# Patient Record
Sex: Female | Born: 1945
Health system: Southern US, Community
[De-identification: ages and names within clinical notes are randomized; demographics above are authoritative.]

## PROBLEM LIST (undated history)

## (undated) DIAGNOSIS — T783XXA Angioneurotic edema, initial encounter: Secondary | ICD-10-CM

## (undated) DIAGNOSIS — E119 Type 2 diabetes mellitus without complications: Secondary | ICD-10-CM

## (undated) DIAGNOSIS — R112 Nausea with vomiting, unspecified: Secondary | ICD-10-CM

## (undated) DIAGNOSIS — M1990 Unspecified osteoarthritis, unspecified site: Secondary | ICD-10-CM

## (undated) DIAGNOSIS — G609 Hereditary and idiopathic neuropathy, unspecified: Secondary | ICD-10-CM

## (undated) DIAGNOSIS — A048 Other specified bacterial intestinal infections: Secondary | ICD-10-CM

## (undated) DIAGNOSIS — K5792 Diverticulitis of intestine, part unspecified, without perforation or abscess without bleeding: Secondary | ICD-10-CM

## (undated) DIAGNOSIS — I10 Essential (primary) hypertension: Secondary | ICD-10-CM

## (undated) DIAGNOSIS — I499 Cardiac arrhythmia, unspecified: Secondary | ICD-10-CM

## (undated) DIAGNOSIS — C189 Malignant neoplasm of colon, unspecified: Secondary | ICD-10-CM

## (undated) DIAGNOSIS — T4145XA Adverse effect of unspecified anesthetic, initial encounter: Secondary | ICD-10-CM

## (undated) DIAGNOSIS — Z9889 Other specified postprocedural states: Secondary | ICD-10-CM

## (undated) DIAGNOSIS — T8859XA Other complications of anesthesia, initial encounter: Secondary | ICD-10-CM

## (undated) HISTORY — DX: Type 2 diabetes mellitus without complications: E11.9

## (undated) HISTORY — DX: Other specified bacterial intestinal infections: A04.8

## (undated) HISTORY — PX: TUBAL LIGATION: SHX77

## (undated) HISTORY — DX: Diverticulitis of intestine, part unspecified, without perforation or abscess without bleeding: K57.92

## (undated) HISTORY — DX: Angioneurotic edema, initial encounter: T78.3XXA

## (undated) HISTORY — PX: TONSILLECTOMY: SUR1361

## (undated) HISTORY — DX: Hereditary and idiopathic neuropathy, unspecified: G60.9

---

## 1998-03-04 ENCOUNTER — Ambulatory Visit (HOSPITAL_COMMUNITY): Admission: RE | Admit: 1998-03-04 | Discharge: 1998-03-04 | Payer: Self-pay | Admitting: Obstetrics and Gynecology

## 1998-03-04 ENCOUNTER — Encounter: Payer: Self-pay | Admitting: Obstetrics and Gynecology

## 1999-03-07 ENCOUNTER — Encounter: Payer: Self-pay | Admitting: Obstetrics and Gynecology

## 1999-03-07 ENCOUNTER — Ambulatory Visit (HOSPITAL_COMMUNITY): Admission: RE | Admit: 1999-03-07 | Discharge: 1999-03-07 | Payer: Self-pay | Admitting: Obstetrics and Gynecology

## 1999-11-10 ENCOUNTER — Ambulatory Visit (HOSPITAL_COMMUNITY): Admission: RE | Admit: 1999-11-10 | Discharge: 1999-11-10 | Payer: Self-pay | Admitting: Gastroenterology

## 1999-11-10 ENCOUNTER — Encounter (INDEPENDENT_AMBULATORY_CARE_PROVIDER_SITE_OTHER): Payer: Self-pay | Admitting: *Deleted

## 1999-12-22 ENCOUNTER — Encounter: Payer: Self-pay | Admitting: Gastroenterology

## 1999-12-22 ENCOUNTER — Encounter: Admission: RE | Admit: 1999-12-22 | Discharge: 1999-12-22 | Payer: Self-pay | Admitting: Gastroenterology

## 1999-12-28 ENCOUNTER — Encounter (INDEPENDENT_AMBULATORY_CARE_PROVIDER_SITE_OTHER): Payer: Self-pay

## 1999-12-28 ENCOUNTER — Other Ambulatory Visit: Admission: RE | Admit: 1999-12-28 | Discharge: 1999-12-28 | Payer: Self-pay | Admitting: Obstetrics and Gynecology

## 2001-01-13 ENCOUNTER — Other Ambulatory Visit: Admission: RE | Admit: 2001-01-13 | Discharge: 2001-01-13 | Payer: Self-pay | Admitting: Obstetrics and Gynecology

## 2002-05-01 ENCOUNTER — Other Ambulatory Visit: Admission: RE | Admit: 2002-05-01 | Discharge: 2002-05-01 | Payer: Self-pay | Admitting: Obstetrics and Gynecology

## 2003-10-01 ENCOUNTER — Other Ambulatory Visit: Admission: RE | Admit: 2003-10-01 | Discharge: 2003-10-01 | Payer: Self-pay | Admitting: Obstetrics and Gynecology

## 2005-01-11 ENCOUNTER — Other Ambulatory Visit: Admission: RE | Admit: 2005-01-11 | Discharge: 2005-01-11 | Payer: Self-pay | Admitting: Obstetrics and Gynecology

## 2005-04-16 HISTORY — PX: COLON RESECTION: SHX5231

## 2005-08-28 ENCOUNTER — Encounter: Admission: RE | Admit: 2005-08-28 | Discharge: 2005-08-28 | Payer: Self-pay | Admitting: Gastroenterology

## 2005-09-19 ENCOUNTER — Inpatient Hospital Stay (HOSPITAL_COMMUNITY): Admission: RE | Admit: 2005-09-19 | Discharge: 2005-09-22 | Payer: Self-pay | Admitting: Surgery

## 2005-09-19 ENCOUNTER — Encounter (INDEPENDENT_AMBULATORY_CARE_PROVIDER_SITE_OTHER): Payer: Self-pay | Admitting: Specialist

## 2005-09-21 ENCOUNTER — Ambulatory Visit: Payer: Self-pay | Admitting: Oncology

## 2005-09-24 ENCOUNTER — Ambulatory Visit: Payer: Self-pay | Admitting: Oncology

## 2005-10-18 ENCOUNTER — Ambulatory Visit (HOSPITAL_COMMUNITY): Admission: RE | Admit: 2005-10-18 | Discharge: 2005-10-18 | Payer: Self-pay | Admitting: Surgery

## 2005-10-23 LAB — CBC WITH DIFFERENTIAL/PLATELET
Basophils Absolute: 0 10*3/uL (ref 0.0–0.1)
Eosinophils Absolute: 0.2 10*3/uL (ref 0.0–0.5)
HGB: 12.2 g/dL (ref 11.6–15.9)
MCV: 89 fL (ref 81.0–101.0)
MONO#: 0.4 10*3/uL (ref 0.1–0.9)
MONO%: 6.4 % (ref 0.0–13.0)
NEUT#: 3.7 10*3/uL (ref 1.5–6.5)
Platelets: 183 10*3/uL (ref 145–400)
RBC: 4.2 10*6/uL (ref 3.70–5.32)
RDW: 14.4 % (ref 11.3–14.5)
WBC: 5.5 10*3/uL (ref 3.9–10.0)

## 2005-10-23 LAB — COMPREHENSIVE METABOLIC PANEL
Albumin: 3.6 g/dL (ref 3.5–5.2)
Alkaline Phosphatase: 86 U/L (ref 39–117)
BUN: 18 mg/dL (ref 6–23)
CO2: 26 mEq/L (ref 19–32)
Calcium: 9.4 mg/dL (ref 8.4–10.5)
Glucose, Bld: 239 mg/dL — ABNORMAL HIGH (ref 70–99)
Potassium: 3.7 mEq/L (ref 3.5–5.3)
Sodium: 135 mEq/L (ref 135–145)
Total Protein: 7.1 g/dL (ref 6.0–8.3)

## 2005-11-06 LAB — CBC WITH DIFFERENTIAL/PLATELET
BASO%: 0.6 % (ref 0.0–2.0)
EOS%: 3.3 % (ref 0.0–7.0)
HCT: 33.1 % — ABNORMAL LOW (ref 34.8–46.6)
MCH: 29.1 pg (ref 26.0–34.0)
MCHC: 33.4 g/dL (ref 32.0–36.0)
MONO%: 8 % (ref 0.0–13.0)
NEUT%: 50.6 % (ref 39.6–76.8)
RDW: 15.8 % — ABNORMAL HIGH (ref 11.3–14.5)
lymph#: 1.3 10*3/uL (ref 0.9–3.3)

## 2005-11-06 LAB — COMPREHENSIVE METABOLIC PANEL
ALT: 24 U/L (ref 0–40)
AST: 19 U/L (ref 0–37)
Alkaline Phosphatase: 61 U/L (ref 39–117)
Calcium: 9.1 mg/dL (ref 8.4–10.5)
Chloride: 102 mEq/L (ref 96–112)
Creatinine, Ser: 0.6 mg/dL (ref 0.40–1.20)

## 2005-11-09 ENCOUNTER — Ambulatory Visit: Payer: Self-pay | Admitting: Oncology

## 2005-11-20 LAB — COMPREHENSIVE METABOLIC PANEL
Albumin: 3.5 g/dL (ref 3.5–5.2)
BUN: 18 mg/dL (ref 6–23)
CO2: 27 mEq/L (ref 19–32)
Calcium: 9.6 mg/dL (ref 8.4–10.5)
Chloride: 97 mEq/L (ref 96–112)
Glucose, Bld: 266 mg/dL — ABNORMAL HIGH (ref 70–99)
Potassium: 4.2 mEq/L (ref 3.5–5.3)

## 2005-11-20 LAB — CBC WITH DIFFERENTIAL/PLATELET
Basophils Absolute: 0 10*3/uL (ref 0.0–0.1)
Eosinophils Absolute: 0.1 10*3/uL (ref 0.0–0.5)
HCT: 36 % (ref 34.8–46.6)
HGB: 12 g/dL (ref 11.6–15.9)
NEUT#: 2.6 10*3/uL (ref 1.5–6.5)
NEUT%: 56.1 % (ref 39.6–76.8)
RDW: 16.9 % — ABNORMAL HIGH (ref 11.3–14.5)
lymph#: 1.5 10*3/uL (ref 0.9–3.3)

## 2005-12-04 LAB — CBC WITH DIFFERENTIAL/PLATELET
Basophils Absolute: 0 10*3/uL (ref 0.0–0.1)
EOS%: 4.7 % (ref 0.0–7.0)
HGB: 11.5 g/dL — ABNORMAL LOW (ref 11.6–15.9)
MCH: 29.3 pg (ref 26.0–34.0)
MONO#: 0.4 10*3/uL (ref 0.1–0.9)
NEUT#: 2.4 10*3/uL (ref 1.5–6.5)
RDW: 18.2 % — ABNORMAL HIGH (ref 11.3–14.5)
WBC: 4.3 10*3/uL (ref 3.9–10.0)
lymph#: 1.3 10*3/uL (ref 0.9–3.3)

## 2005-12-04 LAB — COMPREHENSIVE METABOLIC PANEL
ALT: 26 U/L (ref 0–40)
AST: 23 U/L (ref 0–37)
Albumin: 3.5 g/dL (ref 3.5–5.2)
BUN: 17 mg/dL (ref 6–23)
Calcium: 9.3 mg/dL (ref 8.4–10.5)
Chloride: 100 mEq/L (ref 96–112)
Potassium: 4 mEq/L (ref 3.5–5.3)

## 2005-12-18 LAB — CBC WITH DIFFERENTIAL/PLATELET
Eosinophils Absolute: 0.2 10*3/uL (ref 0.0–0.5)
HCT: 30.6 % — ABNORMAL LOW (ref 34.8–46.6)
LYMPH%: 36.8 % (ref 14.0–48.0)
MCV: 87.7 fL (ref 81.0–101.0)
MONO%: 9.3 % (ref 0.0–13.0)
NEUT#: 1.6 10*3/uL (ref 1.5–6.5)
NEUT%: 47.5 % (ref 39.6–76.8)
Platelets: 129 10*3/uL — ABNORMAL LOW (ref 145–400)
RBC: 3.49 10*6/uL — ABNORMAL LOW (ref 3.70–5.32)

## 2005-12-18 LAB — COMPREHENSIVE METABOLIC PANEL
Alkaline Phosphatase: 67 U/L (ref 39–117)
BUN: 12 mg/dL (ref 6–23)
Creatinine, Ser: 0.72 mg/dL (ref 0.40–1.20)
Glucose, Bld: 275 mg/dL — ABNORMAL HIGH (ref 70–99)
Sodium: 139 mEq/L (ref 135–145)
Total Bilirubin: 0.5 mg/dL (ref 0.3–1.2)
Total Protein: 6.1 g/dL (ref 6.0–8.3)

## 2005-12-28 ENCOUNTER — Ambulatory Visit: Payer: Self-pay | Admitting: Oncology

## 2006-01-01 LAB — CBC WITH DIFFERENTIAL/PLATELET
Basophils Absolute: 0 10*3/uL (ref 0.0–0.1)
Eosinophils Absolute: 0.1 10*3/uL (ref 0.0–0.5)
HGB: 11.6 g/dL (ref 11.6–15.9)
NEUT#: 2.2 10*3/uL (ref 1.5–6.5)
RBC: 3.89 10*6/uL (ref 3.70–5.32)
RDW: 21.8 % — ABNORMAL HIGH (ref 11.3–14.5)
WBC: 4.2 10*3/uL (ref 3.9–10.0)
lymph#: 1.4 10*3/uL (ref 0.9–3.3)

## 2006-01-01 LAB — COMPREHENSIVE METABOLIC PANEL
Albumin: 3.9 g/dL (ref 3.5–5.2)
BUN: 16 mg/dL (ref 6–23)
CO2: 24 mEq/L (ref 19–32)
Calcium: 9.5 mg/dL (ref 8.4–10.5)
Chloride: 102 mEq/L (ref 96–112)
Glucose, Bld: 295 mg/dL — ABNORMAL HIGH (ref 70–99)
Potassium: 4 mEq/L (ref 3.5–5.3)
Sodium: 138 mEq/L (ref 135–145)
Total Protein: 7.1 g/dL (ref 6.0–8.3)

## 2006-01-29 LAB — COMPREHENSIVE METABOLIC PANEL
Albumin: 3.4 g/dL — ABNORMAL LOW (ref 3.5–5.2)
BUN: 14 mg/dL (ref 6–23)
CO2: 27 mEq/L (ref 19–32)
Calcium: 9.8 mg/dL (ref 8.4–10.5)
Chloride: 100 mEq/L (ref 96–112)
Creatinine, Ser: 0.69 mg/dL (ref 0.40–1.20)
Glucose, Bld: 279 mg/dL — ABNORMAL HIGH (ref 70–99)
Potassium: 4.1 mEq/L (ref 3.5–5.3)

## 2006-01-29 LAB — CBC WITH DIFFERENTIAL/PLATELET
BASO%: 0.7 % (ref 0.0–2.0)
Basophils Absolute: 0 10*3/uL (ref 0.0–0.1)
HCT: 33.7 % — ABNORMAL LOW (ref 34.8–46.6)
HGB: 11 g/dL — ABNORMAL LOW (ref 11.6–15.9)
MONO#: 0.4 10*3/uL (ref 0.1–0.9)
NEUT%: 51 % (ref 39.6–76.8)
RDW: 21.5 % — ABNORMAL HIGH (ref 11.3–14.5)
WBC: 3.4 10*3/uL — ABNORMAL LOW (ref 3.9–10.0)
lymph#: 1.1 10*3/uL (ref 0.9–3.3)

## 2006-02-12 ENCOUNTER — Ambulatory Visit: Payer: Self-pay | Admitting: Oncology

## 2006-02-12 LAB — CBC WITH DIFFERENTIAL/PLATELET
Basophils Absolute: 0 10*3/uL (ref 0.0–0.1)
EOS%: 2.3 % (ref 0.0–7.0)
Eosinophils Absolute: 0.1 10*3/uL (ref 0.0–0.5)
HCT: 36 % (ref 34.8–46.6)
HGB: 11.8 g/dL (ref 11.6–15.9)
MCH: 31.2 pg (ref 26.0–34.0)
NEUT#: 2.2 10*3/uL (ref 1.5–6.5)
NEUT%: 50.9 % (ref 39.6–76.8)
RDW: 21.3 % — ABNORMAL HIGH (ref 11.3–14.5)
lymph#: 1.4 10*3/uL (ref 0.9–3.3)

## 2006-02-12 LAB — COMPREHENSIVE METABOLIC PANEL
Albumin: 3.6 g/dL (ref 3.5–5.2)
Alkaline Phosphatase: 75 U/L (ref 39–117)
BUN: 13 mg/dL (ref 6–23)
Calcium: 9.2 mg/dL (ref 8.4–10.5)
Chloride: 99 mEq/L (ref 96–112)
Creatinine, Ser: 0.81 mg/dL (ref 0.40–1.20)
Glucose, Bld: 290 mg/dL — ABNORMAL HIGH (ref 70–99)
Potassium: 3.7 mEq/L (ref 3.5–5.3)

## 2006-02-22 LAB — CBC WITH DIFFERENTIAL/PLATELET
Basophils Absolute: 0 10*3/uL (ref 0.0–0.1)
EOS%: 4.4 % (ref 0.0–7.0)
HCT: 31.2 % — ABNORMAL LOW (ref 34.8–46.6)
HGB: 10.3 g/dL — ABNORMAL LOW (ref 11.6–15.9)
MCH: 31.3 pg (ref 26.0–34.0)
MCV: 95 fL (ref 81.0–101.0)
NEUT%: 48 % (ref 39.6–76.8)
lymph#: 1 10*3/uL (ref 0.9–3.3)

## 2006-03-12 ENCOUNTER — Ambulatory Visit: Payer: Self-pay | Admitting: Oncology

## 2006-03-12 LAB — COMPREHENSIVE METABOLIC PANEL
AST: 22 U/L (ref 0–37)
Alkaline Phosphatase: 82 U/L (ref 39–117)
BUN: 9 mg/dL (ref 6–23)
Creatinine, Ser: 0.63 mg/dL (ref 0.40–1.20)
Potassium: 3.8 mEq/L (ref 3.5–5.3)
Total Bilirubin: 0.7 mg/dL (ref 0.3–1.2)

## 2006-03-12 LAB — CBC WITH DIFFERENTIAL/PLATELET
Basophils Absolute: 0.1 10*3/uL (ref 0.0–0.1)
EOS%: 3.2 % (ref 0.0–7.0)
HGB: 11.4 g/dL — ABNORMAL LOW (ref 11.6–15.9)
LYMPH%: 21.9 % (ref 14.0–48.0)
MCH: 31.7 pg (ref 26.0–34.0)
MCV: 97.3 fL (ref 81.0–101.0)
MONO%: 8.6 % (ref 0.0–13.0)
NEUT%: 65.2 % (ref 39.6–76.8)
Platelets: 111 10*3/uL — ABNORMAL LOW (ref 145–400)
RDW: 16.8 % — ABNORMAL HIGH (ref 11.3–14.5)

## 2006-03-26 LAB — COMPREHENSIVE METABOLIC PANEL
AST: 31 U/L (ref 0–37)
Alkaline Phosphatase: 74 U/L (ref 39–117)
BUN: 17 mg/dL (ref 6–23)
Creatinine, Ser: 0.74 mg/dL (ref 0.40–1.20)

## 2006-03-26 LAB — CBC WITH DIFFERENTIAL/PLATELET
Basophils Absolute: 0 10*3/uL (ref 0.0–0.1)
EOS%: 4.1 % (ref 0.0–7.0)
HCT: 33.1 % — ABNORMAL LOW (ref 34.8–46.6)
HGB: 11 g/dL — ABNORMAL LOW (ref 11.6–15.9)
MCH: 32 pg (ref 26.0–34.0)
MCV: 96 fL (ref 81.0–101.0)
MONO%: 7.6 % (ref 0.0–13.0)
NEUT%: 59.6 % (ref 39.6–76.8)

## 2006-04-11 ENCOUNTER — Ambulatory Visit (HOSPITAL_COMMUNITY): Admission: RE | Admit: 2006-04-11 | Discharge: 2006-04-11 | Payer: Self-pay | Admitting: Oncology

## 2006-04-22 ENCOUNTER — Ambulatory Visit: Payer: Self-pay | Admitting: Oncology

## 2006-04-25 LAB — COMPREHENSIVE METABOLIC PANEL
ALT: 27 U/L (ref 0–35)
AST: 28 U/L (ref 0–37)
Alkaline Phosphatase: 78 U/L (ref 39–117)
BUN: 14 mg/dL (ref 6–23)
Creatinine, Ser: 0.64 mg/dL (ref 0.40–1.20)
Total Bilirubin: 0.9 mg/dL (ref 0.3–1.2)

## 2006-04-25 LAB — CBC WITH DIFFERENTIAL/PLATELET
BASO%: 0.3 % (ref 0.0–2.0)
Basophils Absolute: 0 10*3/uL (ref 0.0–0.1)
EOS%: 2.8 % (ref 0.0–7.0)
HCT: 37.2 % (ref 34.8–46.6)
LYMPH%: 27.1 % (ref 14.0–48.0)
MCH: 31.4 pg (ref 26.0–34.0)
MCHC: 33.2 g/dL (ref 32.0–36.0)
MCV: 94.6 fL (ref 81.0–101.0)
MONO%: 6.3 % (ref 0.0–13.0)
NEUT%: 63.5 % (ref 39.6–76.8)
Platelets: 151 10*3/uL (ref 145–400)
lymph#: 1.2 10*3/uL (ref 0.9–3.3)

## 2006-06-04 ENCOUNTER — Ambulatory Visit: Payer: Self-pay | Admitting: Oncology

## 2006-07-17 ENCOUNTER — Ambulatory Visit: Payer: Self-pay | Admitting: Oncology

## 2006-07-25 LAB — CBC WITH DIFFERENTIAL/PLATELET
BASO%: 0.2 % (ref 0.0–2.0)
Basophils Absolute: 0 10*3/uL (ref 0.0–0.1)
EOS%: 2.5 % (ref 0.0–7.0)
Eosinophils Absolute: 0.1 10*3/uL (ref 0.0–0.5)
HCT: 35.2 % (ref 34.8–46.6)
HGB: 12.2 g/dL (ref 11.6–15.9)
LYMPH%: 25.9 % (ref 14.0–48.0)
MCH: 30.9 pg (ref 26.0–34.0)
MCHC: 34.7 g/dL (ref 32.0–36.0)
MCV: 88.8 fL (ref 81.0–101.0)
MONO#: 0.3 10*3/uL (ref 0.1–0.9)
MONO%: 5.5 % (ref 0.0–13.0)
NEUT#: 3.5 10*3/uL (ref 1.5–6.5)
NEUT%: 65.9 % (ref 39.6–76.8)
Platelets: 184 10*3/uL (ref 145–400)
RBC: 3.96 10*6/uL (ref 3.70–5.32)
RDW: 15.7 % — ABNORMAL HIGH (ref 11.3–14.5)
WBC: 5.3 10*3/uL (ref 3.9–10.0)
lymph#: 1.4 10*3/uL (ref 0.9–3.3)

## 2006-07-25 LAB — COMPREHENSIVE METABOLIC PANEL
ALT: 21 U/L (ref 0–35)
AST: 14 U/L (ref 0–37)
Alkaline Phosphatase: 71 U/L (ref 39–117)
Calcium: 9.6 mg/dL (ref 8.4–10.5)
Chloride: 102 mEq/L (ref 96–112)
Creatinine, Ser: 0.65 mg/dL (ref 0.40–1.20)

## 2006-08-28 ENCOUNTER — Ambulatory Visit: Payer: Self-pay | Admitting: Oncology

## 2006-10-11 ENCOUNTER — Ambulatory Visit (HOSPITAL_COMMUNITY): Admission: RE | Admit: 2006-10-11 | Discharge: 2006-10-11 | Payer: Self-pay | Admitting: Oncology

## 2006-10-23 ENCOUNTER — Ambulatory Visit: Payer: Self-pay | Admitting: Oncology

## 2006-10-25 LAB — CBC WITH DIFFERENTIAL/PLATELET
Basophils Absolute: 0.1 10*3/uL (ref 0.0–0.1)
EOS%: 2.4 % (ref 0.0–7.0)
Eosinophils Absolute: 0.1 10*3/uL (ref 0.0–0.5)
HCT: 35.9 % (ref 34.8–46.6)
HGB: 12.3 g/dL (ref 11.6–15.9)
LYMPH%: 20.9 % (ref 14.0–48.0)
MCH: 31 pg (ref 26.0–34.0)
MCV: 90.4 fL (ref 81.0–101.0)
MONO%: 5 % (ref 0.0–13.0)
NEUT%: 70.7 % (ref 39.6–76.8)
Platelets: 157 10*3/uL (ref 145–400)
RDW: 13.2 % (ref 11.3–14.5)

## 2006-10-25 LAB — COMPREHENSIVE METABOLIC PANEL
AST: 15 U/L (ref 0–37)
Alkaline Phosphatase: 81 U/L (ref 39–117)
BUN: 20 mg/dL (ref 6–23)
Creatinine, Ser: 0.76 mg/dL (ref 0.40–1.20)
Glucose, Bld: 188 mg/dL — ABNORMAL HIGH (ref 70–99)

## 2006-10-27 ENCOUNTER — Emergency Department (HOSPITAL_COMMUNITY): Admission: EM | Admit: 2006-10-27 | Discharge: 2006-10-27 | Payer: Self-pay | Admitting: *Deleted

## 2006-12-17 ENCOUNTER — Ambulatory Visit (HOSPITAL_BASED_OUTPATIENT_CLINIC_OR_DEPARTMENT_OTHER): Admission: RE | Admit: 2006-12-17 | Discharge: 2006-12-17 | Payer: Self-pay | Admitting: Surgery

## 2007-01-28 ENCOUNTER — Ambulatory Visit: Payer: Self-pay | Admitting: Oncology

## 2007-01-30 LAB — CBC WITH DIFFERENTIAL/PLATELET
Basophils Absolute: 0 10*3/uL (ref 0.0–0.1)
Eosinophils Absolute: 0.2 10*3/uL (ref 0.0–0.5)
HGB: 12.6 g/dL (ref 11.6–15.9)
LYMPH%: 26 % (ref 14.0–48.0)
MCV: 91.5 fL (ref 81.0–101.0)
MONO#: 0.2 10*3/uL (ref 0.1–0.9)
MONO%: 3.9 % (ref 0.0–13.0)
NEUT#: 3.6 10*3/uL (ref 1.5–6.5)
Platelets: 157 10*3/uL (ref 145–400)
RDW: 14.6 % — ABNORMAL HIGH (ref 11.3–14.5)
WBC: 5.4 10*3/uL (ref 3.9–10.0)

## 2007-01-30 LAB — COMPREHENSIVE METABOLIC PANEL
ALT: 19 U/L (ref 0–35)
AST: 13 U/L (ref 0–37)
Chloride: 105 mEq/L (ref 96–112)
Creatinine, Ser: 0.57 mg/dL (ref 0.40–1.20)
Sodium: 141 mEq/L (ref 135–145)
Total Bilirubin: 0.4 mg/dL (ref 0.3–1.2)

## 2007-04-08 ENCOUNTER — Ambulatory Visit (HOSPITAL_COMMUNITY): Admission: RE | Admit: 2007-04-08 | Discharge: 2007-04-08 | Payer: Self-pay | Admitting: Oncology

## 2007-04-11 ENCOUNTER — Ambulatory Visit: Payer: Self-pay | Admitting: Oncology

## 2007-04-15 LAB — CBC WITH DIFFERENTIAL/PLATELET
Basophils Absolute: 0 10*3/uL (ref 0.0–0.1)
EOS%: 3.6 % (ref 0.0–7.0)
HCT: 36.2 % (ref 34.8–46.6)
HGB: 12.4 g/dL (ref 11.6–15.9)
LYMPH%: 27.9 % (ref 14.0–48.0)
MCH: 31.2 pg (ref 26.0–34.0)
MCV: 90.9 fL (ref 81.0–101.0)
MONO%: 5.3 % (ref 0.0–13.0)
NEUT%: 63 % (ref 39.6–76.8)
Platelets: 164 10*3/uL (ref 145–400)

## 2007-04-15 LAB — COMPREHENSIVE METABOLIC PANEL
AST: 15 U/L (ref 0–37)
Alkaline Phosphatase: 84 U/L (ref 39–117)
BUN: 17 mg/dL (ref 6–23)
Calcium: 10.1 mg/dL (ref 8.4–10.5)
Chloride: 104 mEq/L (ref 96–112)
Creatinine, Ser: 0.55 mg/dL (ref 0.40–1.20)

## 2007-08-08 ENCOUNTER — Ambulatory Visit: Payer: Self-pay | Admitting: Oncology

## 2007-08-12 LAB — COMPREHENSIVE METABOLIC PANEL
ALT: 20 U/L (ref 0–35)
AST: 14 U/L (ref 0–37)
Calcium: 9.5 mg/dL (ref 8.4–10.5)
Chloride: 101 mEq/L (ref 96–112)
Creatinine, Ser: 0.7 mg/dL (ref 0.40–1.20)
Sodium: 136 mEq/L (ref 135–145)
Total Bilirubin: 0.4 mg/dL (ref 0.3–1.2)

## 2007-08-12 LAB — CBC WITH DIFFERENTIAL/PLATELET
BASO%: 0.5 % (ref 0.0–2.0)
EOS%: 1.6 % (ref 0.0–7.0)
HCT: 33.5 % — ABNORMAL LOW (ref 34.8–46.6)
MCH: 31 pg (ref 26.0–34.0)
MCHC: 34.3 g/dL (ref 32.0–36.0)
NEUT%: 72.4 % (ref 39.6–76.8)
RBC: 3.71 10*6/uL (ref 3.70–5.32)
lymph#: 1.1 10*3/uL (ref 0.9–3.3)

## 2007-11-06 ENCOUNTER — Ambulatory Visit: Payer: Self-pay | Admitting: Oncology

## 2007-11-11 LAB — CBC WITH DIFFERENTIAL/PLATELET
BASO%: 0.3 % (ref 0.0–2.0)
Eosinophils Absolute: 0.1 10*3/uL (ref 0.0–0.5)
MCHC: 33.8 g/dL (ref 32.0–36.0)
MONO#: 0.3 10*3/uL (ref 0.1–0.9)
NEUT#: 3.9 10*3/uL (ref 1.5–6.5)
RBC: 3.99 10*6/uL (ref 3.70–5.32)
WBC: 5.9 10*3/uL (ref 3.9–10.0)
lymph#: 1.4 10*3/uL (ref 0.9–3.3)

## 2007-11-11 LAB — COMPREHENSIVE METABOLIC PANEL
ALT: 22 U/L (ref 0–35)
Albumin: 4.5 g/dL (ref 3.5–5.2)
CO2: 24 mEq/L (ref 19–32)
Calcium: 9.7 mg/dL (ref 8.4–10.5)
Chloride: 102 mEq/L (ref 96–112)
Glucose, Bld: 142 mg/dL — ABNORMAL HIGH (ref 70–99)
Potassium: 4.2 mEq/L (ref 3.5–5.3)
Sodium: 138 mEq/L (ref 135–145)
Total Bilirubin: 0.4 mg/dL (ref 0.3–1.2)
Total Protein: 7.4 g/dL (ref 6.0–8.3)

## 2007-11-11 LAB — CEA: CEA: 2.4 ng/mL (ref 0.0–5.0)

## 2007-11-14 ENCOUNTER — Ambulatory Visit (HOSPITAL_COMMUNITY): Admission: RE | Admit: 2007-11-14 | Discharge: 2007-11-14 | Payer: Self-pay | Admitting: Oncology

## 2008-01-27 ENCOUNTER — Encounter: Admission: RE | Admit: 2008-01-27 | Discharge: 2008-01-27 | Payer: Self-pay | Admitting: Family Medicine

## 2008-02-16 ENCOUNTER — Ambulatory Visit: Payer: Self-pay | Admitting: Oncology

## 2008-02-18 LAB — CBC WITH DIFFERENTIAL/PLATELET
Basophils Absolute: 0 10*3/uL (ref 0.0–0.1)
Eosinophils Absolute: 0.2 10*3/uL (ref 0.0–0.5)
HGB: 12.6 g/dL (ref 11.6–15.9)
LYMPH%: 23.5 % (ref 14.0–48.0)
MCV: 90.7 fL (ref 81.0–101.0)
MONO#: 0.2 10*3/uL (ref 0.1–0.9)
MONO%: 3.3 % (ref 0.0–13.0)
NEUT#: 3.6 10*3/uL (ref 1.5–6.5)
Platelets: 157 10*3/uL (ref 145–400)
RBC: 4.13 10*6/uL (ref 3.70–5.32)
WBC: 5.2 10*3/uL (ref 3.9–10.0)

## 2008-02-18 LAB — LACTATE DEHYDROGENASE: LDH: 98 U/L (ref 94–250)

## 2008-02-18 LAB — COMPREHENSIVE METABOLIC PANEL
Albumin: 4.3 g/dL (ref 3.5–5.2)
BUN: 19 mg/dL (ref 6–23)
CO2: 23 mEq/L (ref 19–32)
Glucose, Bld: 245 mg/dL — ABNORMAL HIGH (ref 70–99)
Potassium: 4.1 mEq/L (ref 3.5–5.3)
Sodium: 137 mEq/L (ref 135–145)
Total Bilirubin: 0.4 mg/dL (ref 0.3–1.2)
Total Protein: 7.3 g/dL (ref 6.0–8.3)

## 2008-05-13 ENCOUNTER — Ambulatory Visit: Payer: Self-pay | Admitting: Oncology

## 2008-05-17 LAB — CBC WITH DIFFERENTIAL/PLATELET
Basophils Absolute: 0 10*3/uL (ref 0.0–0.1)
EOS%: 2.3 % (ref 0.0–7.0)
Eosinophils Absolute: 0.1 10*3/uL (ref 0.0–0.5)
HCT: 38.3 % (ref 34.8–46.6)
HGB: 12.9 g/dL (ref 11.6–15.9)
LYMPH%: 23.3 % (ref 14.0–48.0)
MCH: 30.6 pg (ref 26.0–34.0)
MCV: 91 fL (ref 81.0–101.0)
MONO%: 4.7 % (ref 0.0–13.0)
NEUT#: 4.1 10*3/uL (ref 1.5–6.5)
NEUT%: 69.4 % (ref 39.6–76.8)
Platelets: 173 10*3/uL (ref 145–400)
RDW: 14.9 % — ABNORMAL HIGH (ref 11.3–14.5)

## 2008-05-18 LAB — COMPREHENSIVE METABOLIC PANEL
AST: 21 U/L (ref 0–37)
Albumin: 4.3 g/dL (ref 3.5–5.2)
Alkaline Phosphatase: 65 U/L (ref 39–117)
BUN: 20 mg/dL (ref 6–23)
Creatinine, Ser: 0.73 mg/dL (ref 0.40–1.20)
Glucose, Bld: 302 mg/dL — ABNORMAL HIGH (ref 70–99)
Total Bilirubin: 0.4 mg/dL (ref 0.3–1.2)

## 2008-06-09 ENCOUNTER — Ambulatory Visit (HOSPITAL_COMMUNITY): Admission: RE | Admit: 2008-06-09 | Discharge: 2008-06-09 | Payer: Self-pay | Admitting: Oncology

## 2008-10-21 ENCOUNTER — Ambulatory Visit (HOSPITAL_COMMUNITY): Admission: RE | Admit: 2008-10-21 | Discharge: 2008-10-21 | Payer: Self-pay | Admitting: Family Medicine

## 2008-12-14 ENCOUNTER — Ambulatory Visit: Payer: Self-pay | Admitting: Oncology

## 2008-12-16 LAB — COMPREHENSIVE METABOLIC PANEL
ALT: 18 U/L (ref 0–35)
AST: 14 U/L (ref 0–37)
Alkaline Phosphatase: 67 U/L (ref 39–117)
Calcium: 9.4 mg/dL (ref 8.4–10.5)
Chloride: 104 mEq/L (ref 96–112)
Creatinine, Ser: 0.77 mg/dL (ref 0.40–1.20)
Potassium: 4.3 mEq/L (ref 3.5–5.3)

## 2008-12-16 LAB — CBC WITH DIFFERENTIAL/PLATELET
BASO%: 0.5 % (ref 0.0–2.0)
EOS%: 3.3 % (ref 0.0–7.0)
MCH: 30.8 pg (ref 25.1–34.0)
MCHC: 33.6 g/dL (ref 31.5–36.0)
MCV: 91.8 fL (ref 79.5–101.0)
MONO%: 4.4 % (ref 0.0–14.0)
NEUT%: 64.3 % (ref 38.4–76.8)
RDW: 15.3 % — ABNORMAL HIGH (ref 11.2–14.5)
lymph#: 1.5 10*3/uL (ref 0.9–3.3)

## 2009-02-16 ENCOUNTER — Encounter: Admission: RE | Admit: 2009-02-16 | Discharge: 2009-02-16 | Payer: Self-pay | Admitting: Family Medicine

## 2009-06-14 ENCOUNTER — Ambulatory Visit (HOSPITAL_COMMUNITY): Admission: RE | Admit: 2009-06-14 | Discharge: 2009-06-14 | Payer: Self-pay | Admitting: Oncology

## 2009-06-14 ENCOUNTER — Ambulatory Visit: Payer: Self-pay | Admitting: Oncology

## 2009-06-14 LAB — CBC WITH DIFFERENTIAL/PLATELET
BASO%: 0.5 % (ref 0.0–2.0)
Basophils Absolute: 0 10*3/uL (ref 0.0–0.1)
EOS%: 4.5 % (ref 0.0–7.0)
HGB: 12.7 g/dL (ref 11.6–15.9)
MCH: 31.1 pg (ref 25.1–34.0)
MCHC: 33.8 g/dL (ref 31.5–36.0)
RBC: 4.1 10*6/uL (ref 3.70–5.45)
RDW: 15.1 % — ABNORMAL HIGH (ref 11.2–14.5)
lymph#: 1.6 10*3/uL (ref 0.9–3.3)

## 2009-06-14 LAB — COMPREHENSIVE METABOLIC PANEL
ALT: 23 U/L (ref 0–35)
AST: 21 U/L (ref 0–37)
Albumin: 4.3 g/dL (ref 3.5–5.2)
Calcium: 9.7 mg/dL (ref 8.4–10.5)
Chloride: 98 mEq/L (ref 96–112)
Potassium: 4 mEq/L (ref 3.5–5.3)
Sodium: 134 mEq/L — ABNORMAL LOW (ref 135–145)
Total Protein: 8 g/dL (ref 6.0–8.3)

## 2009-12-15 ENCOUNTER — Ambulatory Visit (HOSPITAL_BASED_OUTPATIENT_CLINIC_OR_DEPARTMENT_OTHER): Payer: Self-pay | Admitting: Oncology

## 2009-12-20 LAB — CBC WITH DIFFERENTIAL/PLATELET
BASO%: 0.3 % (ref 0.0–2.0)
Basophils Absolute: 0 10*3/uL (ref 0.0–0.1)
HCT: 35.8 % (ref 34.8–46.6)
HGB: 11.9 g/dL (ref 11.6–15.9)
LYMPH%: 22.3 % (ref 14.0–49.7)
MCH: 30.3 pg (ref 25.1–34.0)
MCHC: 33.4 g/dL (ref 31.5–36.0)
MCV: 90.7 fL (ref 79.5–101.0)
MONO%: 5.2 % (ref 0.0–14.0)
NEUT%: 67.9 % (ref 38.4–76.8)
RDW: 15.1 % — ABNORMAL HIGH (ref 11.2–14.5)
lymph#: 1.4 10*3/uL (ref 0.9–3.3)

## 2009-12-20 LAB — COMPREHENSIVE METABOLIC PANEL
ALT: 19 U/L (ref 0–35)
AST: 15 U/L (ref 0–37)
Albumin: 4 g/dL (ref 3.5–5.2)
CO2: 25 mEq/L (ref 19–32)
Calcium: 9.4 mg/dL (ref 8.4–10.5)
Chloride: 100 mEq/L (ref 96–112)
Creatinine, Ser: 0.76 mg/dL (ref 0.40–1.20)
Total Protein: 6.9 g/dL (ref 6.0–8.3)

## 2009-12-20 LAB — CEA: CEA: 2.2 ng/mL (ref 0.0–5.0)

## 2010-05-05 ENCOUNTER — Other Ambulatory Visit: Payer: Self-pay | Admitting: Oncology

## 2010-05-05 DIAGNOSIS — C189 Malignant neoplasm of colon, unspecified: Secondary | ICD-10-CM

## 2010-05-07 ENCOUNTER — Encounter: Payer: Self-pay | Admitting: Oncology

## 2010-06-19 ENCOUNTER — Other Ambulatory Visit (HOSPITAL_COMMUNITY): Payer: Self-pay

## 2010-06-19 DIAGNOSIS — C182 Malignant neoplasm of ascending colon: Secondary | ICD-10-CM

## 2010-06-26 ENCOUNTER — Other Ambulatory Visit: Payer: Self-pay | Admitting: Oncology

## 2010-06-26 ENCOUNTER — Encounter (HOSPITAL_BASED_OUTPATIENT_CLINIC_OR_DEPARTMENT_OTHER): Payer: BC Managed Care – PPO | Admitting: Oncology

## 2010-06-26 DIAGNOSIS — C182 Malignant neoplasm of ascending colon: Secondary | ICD-10-CM

## 2010-06-26 LAB — COMPREHENSIVE METABOLIC PANEL
AST: 16 U/L (ref 0–37)
Albumin: 4.3 g/dL (ref 3.5–5.2)
Alkaline Phosphatase: 72 U/L (ref 39–117)
BUN: 15 mg/dL (ref 6–23)
Potassium: 3.8 mEq/L (ref 3.5–5.3)
Sodium: 136 mEq/L (ref 135–145)
Total Bilirubin: 0.4 mg/dL (ref 0.3–1.2)

## 2010-06-26 LAB — CBC WITH DIFFERENTIAL/PLATELET
EOS%: 6.6 % (ref 0.0–7.0)
LYMPH%: 19.9 % (ref 14.0–49.7)
MCH: 30.8 pg (ref 25.1–34.0)
MCV: 91.3 fL (ref 79.5–101.0)
MONO%: 4.6 % (ref 0.0–14.0)
Platelets: 155 10*3/uL (ref 145–400)
RBC: 3.93 10*6/uL (ref 3.70–5.45)
RDW: 15.3 % — ABNORMAL HIGH (ref 11.2–14.5)

## 2010-06-27 ENCOUNTER — Encounter (HOSPITAL_COMMUNITY): Payer: Self-pay

## 2010-06-27 ENCOUNTER — Ambulatory Visit (HOSPITAL_COMMUNITY)
Admission: RE | Admit: 2010-06-27 | Discharge: 2010-06-27 | Disposition: A | Discharge status: Home / Self Care | Payer: BC Managed Care – PPO | Source: Ambulatory Visit | Attending: Oncology | Admitting: Oncology

## 2010-06-27 DIAGNOSIS — I728 Aneurysm of other specified arteries: Secondary | ICD-10-CM | POA: Insufficient documentation

## 2010-06-27 DIAGNOSIS — Z9221 Personal history of antineoplastic chemotherapy: Secondary | ICD-10-CM | POA: Insufficient documentation

## 2010-06-27 DIAGNOSIS — C189 Malignant neoplasm of colon, unspecified: Secondary | ICD-10-CM

## 2010-06-27 DIAGNOSIS — K5730 Diverticulosis of large intestine without perforation or abscess without bleeding: Secondary | ICD-10-CM | POA: Insufficient documentation

## 2010-06-27 HISTORY — DX: Malignant neoplasm of colon, unspecified: C18.9

## 2010-06-27 HISTORY — DX: Essential (primary) hypertension: I10

## 2010-06-27 MED ORDER — IOHEXOL 300 MG/ML  SOLN
100.0000 mL | Freq: Once | INTRAMUSCULAR | Status: AC | PRN
  Administered 2010-06-27: 100 mL via INTRAVENOUS

## 2010-07-07 ENCOUNTER — Encounter (HOSPITAL_BASED_OUTPATIENT_CLINIC_OR_DEPARTMENT_OTHER): Payer: BC Managed Care – PPO | Admitting: Oncology

## 2010-07-07 DIAGNOSIS — G589 Mononeuropathy, unspecified: Secondary | ICD-10-CM

## 2010-07-07 DIAGNOSIS — C182 Malignant neoplasm of ascending colon: Secondary | ICD-10-CM

## 2010-08-29 NOTE — Op Note (Signed)
NAMECANDI, PROFIT                 ACCOUNT NO.:  [id number]   MEDICAL RECORD NO.:  [id number]          PATIENT TYPE:  AMB   LOCATION:  DSC                          FACILITY:  [facility]   PHYSICIAN:  Currie Paris, M.D.DATE OF BIRTH:  1945/09/08   DATE OF PROCEDURE:  12/17/2006  DATE OF DISCHARGE:                               OPERATIVE REPORT   OFFICE MEDICAL RECORD NUMBER:  CCS [id number].   PREOPERATIVE DIAGNOSIS:  Unneeded Port-A-Cath.   POSTOPERATIVE DIAGNOSIS:  Unneeded Port-A-Cath.   OPERATION:  Removal of Port-A-Cath.   SURGEON:  Currie Paris, M.D.   ANESTHESIA:  Local.   CLINICAL HISTORY:  Ms. Goodell has completed her chemotherapy and wished  to have her Port-A-Cath removed.   DESCRIPTION OF PROCEDURE:  The patient was seen in the minor procedure  room and we confirmed Port-A-Cath removal as the planned procedure.  The  area over the Port-A-Cath was prepped with some Betadine and  anesthetized with 1% Xylocaine with epi mixed with a little bit of neut.   I waited about 10 minutes for good effect of the epinephrine.  I then  opened the old scar about 2/3 of its length.  I identified the port and  freed it up from its capsule.  The tubing was backed part way out and a  suture of 4-0 Vicryl placed around the tract to keep back bleeding from  occurring.  The tubing was removed and the suture tied down.  The  remaining attachments of the capsule to the port reservoir were divided  and the port removed.   Everything appeared to be dry.  The incision was closed in layers with 3-  0 Vicryl and 4-0 Monocryl subcuticular plus Dermabond.   The patient tolerated the procedure well and there were no  complications.      Currie Paris, M.D.  Electronically Signed     CJS/MEDQ  D:  12/17/2006  T:  12/17/2006  Job:  [id number]

## 2010-09-01 NOTE — Op Note (Signed)
Carly Buckley, Carly Buckley                 ACCOUNT NO.:  [id number]   MEDICAL RECORD NO.:  [id number]          PATIENT TYPE:  INP   LOCATION:  0002                         FACILITY[facility]   PHYSICIAN:  Currie Paris, M.D.DATE OF BIRTH:  March 24, 1946   DATE OF PROCEDURE:  09/19/2005  DATE OF DISCHARGE:                                 OPERATIVE REPORT   OFFICE MEDICAL RECORD NUMBER [id number].   PREOPERATIVE DIAGNOSIS:  Carcinoma, ascending colon.   POSTOPERATIVE DIAGNOSIS:  Carcinoma, ascending colon.   OPERATION:  Laparoscopic-assisted right hemicolectomy.   SURGEON:  Dr. Jamey Ripa.   ASSISTANT:  Dr. Johna Sheriff.   ANESTHESIA:  General endotracheal.   CLINICAL HISTORY:  Carly Buckley is a 65 year old lady who recently had a  colonoscopy, and a polyp was seen in the cecum, but an ascending colon 2-cm  cancer was also noted.  After preop evaluation, she was scheduled for right  hemicolectomy.  We planned to do this as alaparoscopically assisted.   DESCRIPTION OF PROCEDURE:  The patient was seen in the holding area, and she  had no further questions.  She confirmed that removal of the ascending colon  was the planned procedure.   The patient was then taken to operating room, and after satisfactory general  endotracheal anesthesia had been obtained, a Foley catheter was placed and  the abdomen prepped and draped.  A time-out occurred.   I made a short incision just above the umbilicus, identified the fascia,  opened it and entered the peritoneal cavity under direct vision.  A  pursestring was placed, the Hasson introduced and the abdomen was  insufflated to 15.   Under direct vision, a 10/11 trocar was placed in the lower midline and a 5-  mm in the left upper quadrant.   The camera was placed in the left lower quadrant site, and using graspers  and harmonic, I identified the appendix and the cecum and freed up a little  bit of the cecum going from the cecum superiorly.  She had a fairly  high-  riding cecum.  The terminal ileum was also tethered posteriorly.  This was  also freed up using the harmonic for several inches to get good mobility of  the terminal ileum so that we could bring it over and be sure to be able to  get out were we made our incision.  Once this was done, I continued to  mobilize the ascending colon using a combination of harmonic and blunt  dissection.  As I got up to the level of the hepatic flexure, I was unable  to well visualize coming around that area.  I therefore changed my approach  and moved to the area of the transverse colon which I well identified.  I  was able to make a small window in the omentum and entered the lesser sac.  Using harmonic again, I divided across the omentum, working from medial to  the right upper quadrant.  This gave me a nice plane, and I was able to  safely identify and preserve and not enter the stomach and duodenum.  The  remainder of the colon was then swept off, and we did see the tip of the  kidney as we were sweeping the retroperitoneal materials inferiorly and  medially.  I was then able to connect the areas where we had freed up the  peritoneum from below to above and from the second area where we started  medially and worked into the right upper quadrant.   Once I had all that done, we had a very mobile ascending colon, and I  thought we would that be ready to do the colectomy.   The supraumbilical trocar site was then enlarged, extending it inferiorly to  a length of about 7 cm.  The wound protector device was placed.  I grasped  the appendix, mobilized the terminal ileum up, then the entire right colon  and pulled the omentum out, and we had all of this well up and into the  wound.   With that done, I then used the LigaSure, divided the omentum further so  that we had the omentum freed off from a point in the transverse colon going  up to the hepatic flexure.  I could palpate the tumor within the  ascending  colon near the hepatic flexure.   Once this was done, I made a window in the colon mesentery and divided the  colon mesentery straight down through the base of the mesentery again using  the LigaSure, except for the major vessel which was double tied.  Once I had  this done, I picked spot on the very distal terminal ileum to divide the  terminal ileum and made a small window in the mesentery there.  This was  then divided down to the base of mesentery again using LigaSure for most of  this, but the major right colic/ileocolic vessels were double tied with 2-0  silks.   I then at this point had the entire colon up and all of the mesentery  divided.  We appeared to have healthy ends where we were going to do the  anastomosis.   I tacked the antimesenteric border of the colon and small bowel together.  I  opened both on the antimesenteric border in the area that was going to be  resected.  I then inserted the GIA, fired it to do a stapled side-to-side  functional end-to-end anastomosis.   Staple line was inspected and appeared dry.   I then took the TA-60 stapler, put it through in the window behind the  mesentery to come across both the small bowel and colon to close the common  defect as a single staple line, and this was done and fired.  The specimen  was cut off.  This left about a three fingerbreadth  anastomosis.  Everything appeared to be dry.  The staple lines appeared to be intact and  without any tension.   The mesenteric defect was closed with interrupted 3-0 silks.  Everything  appeared to be dry.  We irrigated and then dropped colon back in.   I opened the specimen on the back table and saw the tumor in the mid portion  of the specimen.  This was sent to pathology for confirmation.   Using new instruments and gloves, the abdomen was then closed.  I used a  running #1 PDS on the fascia and staples on the midline skin.  The abdomen was reinsufflated and the  camera placed again into the remaining lower  midline port, and there had been no  accumulation of blood or evidence of  bleeding or problems while we were closing.  The  right upper quadrant trocar was removed. There was no bleeding.  The lower  midline trocar was removed and appeared dry.  These incisions were likewise  closed with staples.   The patient tolerated the procedure well.  There were no operative  complications.  All counts were correct.      Currie Paris, M.D.  Electronically Signed     CJS/MEDQ  D:  09/19/2005  T:  09/19/2005  Job:  [id number]   cc:   Talmadge Coventry, M.D.  Fax: [phone]   Petra Kuba, M.D.  Fax: [phone]

## 2010-09-01 NOTE — Discharge Summary (Signed)
Carly Buckley, Carly Buckley                 ACCOUNT NO.:  [id number]   MEDICAL RECORD NO.:  [id number]          PATIENT TYPE:  AMB   LOCATION:  DAY                          FACILITY:  [facility]   PHYSICIAN:  Currie Paris, M.D.DATE OF BIRTH:  Feb 04, 1946   DATE OF ADMISSION:  10/18/2005  DATE OF DISCHARGE:  10/18/2005                                 DISCHARGE SUMMARY   CCS [id number].   FINAL DIAGNOSIS:  1.  Carcinoma ascending colon (T3, N1).  2.  Diabetes.  3.  Hypertension   MEDICAL HISTORY:  [name] is a 59-year lady noted to be iron deficient,  had a colonoscopy and carcinoma found in the distal ascending colon.  She  was admitted for surgery.   HOSPITAL COURSE:  The patient was admitted and taken to the operating room  where laparoscopic-assisted right hemicolectomy was performed.  The patient  tolerated the procedure well.   Postoperatively she had a benign postop course.  Her diabetes was managed  with SSI and she maintained good control.  By the second postop day we were  able to start increasing her diet.  Her path report was noted and discussed  with the patient and oncologic consultation with Dr. Blenda Nicely. Shadad  obtained.  By 06/09 she is doing well, comfortable, taking p.o.'s bowels  were working.  Abdomen was soft.  Wound was benign.  At that point she was  felt able to be discharged.  She is discharged satisfactory condition,  resume usual home medications, given a prescription for Tylox for pain and  follow-up in my office.   Final pathology report showed invasive colonic adenocarcinoma, 2 cm in size.  The nearest margin was the radial at 4.5 cm.  24 nodes were removed, one was  positive.  Other laboratory studies revealed a hemoglobin of 12, with white  count of 4000, normal electrolytes.  Hemoglobin A1c was 8.2, repeat was 7.5.  Urine was negative.      Currie Paris, M.D.  Electronically Signed     CJS/MEDQ  D:  11/05/2005  T:  11/05/2005  Job:   [id number]   cc:   Talmadge Coventry, M.D.  Fax: [phone]   Petra Kuba, M.D.  Fax: [phone]   Blenda Nicely. [employer]

## 2010-09-01 NOTE — Consult Note (Signed)
NAMEBILL, MCVEY                 ACCOUNT NO.:  [id number]   MEDICAL RECORD NO.:  [id number]          PATIENT TYPE:  INP   LOCATION:  1303                         FACILITY:  [facility]   PHYSICIAN:  Firas N. Shadad        DATE OF BIRTH:  06/10/1945   DATE OF CONSULTATION:  DATE OF DISCHARGE:                                   CONSULTATION   CONSULTING PHYSICIAN:  Currie Paris, M.D.   REASON FOR CONSULTATION:  Colon cancer.   HISTORY OF PRESENT ILLNESS:  A very pleasant 65 year old female who is a  native here of [place] with a past medical history significant for  hypertension and oral hypoglycemic control diabetes.  The patient was noted  by primary care physician at some point that she is iron deficient, that it  could not be explained, and subsequently underwent a colonoscopy under the  care of Dr. Ewing Schlein about 5 years ago and apparently has had polyps at that  time.  The patient had a recent repeat endoscopy that was done on Aug 21, 2005, which showed basically a semi-sessile polyp found in the cecum.  The  polyp was small in size.  The biopsy was taken.  There were also multiple  small and large-mouthed diverticula found throughout the colon.  There is  also an infiltrating, nonobstructive small mass found in the distal  ascending colon.  The mass measured about 2 cm in length.  No bleeding was  present and that also was biopsied.  The biopsy did confirm that this indeed  was adenocarcinoma.  The patient underwent a staging CT of the abdomen and  pelvis obtained on Aug 28, 2005.  CT scan of the abdomen showed no gross  colonic lesion seen by see scan.  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  CT  scan of the pelvis was essentially negative.  Based on these findings, the  patient was referred to Dr. Jamey Ripa for evaluation.  By the way, the  ascending mass of the polyp in the cecum showed an edematous  polyp with high-  grade dysplasia but no invasive adenocarcinoma was seen.  The patient was  set up and underwent on September 19, 2005, a laparoscopic-assisted hemicolectomy  that essentially was uncomplicated.  The pathology did reveal a T3, N1  adenocarcinoma.  The pathology report was case number [id number], which  showed an invasive colonic adenocarcinoma, tumor invades into the  pericolonic adipose tissue.  There is a separate polyp and one hyperplastic  polyp was seen.  The tumor size was 2.0 cm adenocarcinoma, grade 2.  Distance of invasive carcinoma from the nearest margin, radial margin, was  4.5 cm.  Twenty-four lymph nodes were sampled.  One was positive.  Postoperatively the patient did well without any major complications.   REVIEW OF SYSTEMS:  She did not report any headaches, blurred vision, double  vision, did not report any chest pain, shortness of breath, difficulty  breathing, no cough, hemoptysis or hematemesis.  Leading up  to surgery she  did not report any nausea, vomiting, abdominal pain, weight loss,  hematochezia, melena, change in her color or shape of her bowel movements.  Did not report any lower extremity edema.  Did not report any heat or cold  intolerance.  Did not report any bleeding diathesis.  The rest of the review  of systems was unremarkable.   PAST MEDICAL HISTORY:  Significant for hypertension and diabetes as  mentioned before.   MEDICATIONS:  Glumetza as well as glyburide, as well as  lisinopril/hydrochlorothiazide __________.   ALLERGIES:  None.   SOCIAL HISTORY:  Does not smoke or drink.  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   FAMILY HISTORY:  Positive for renal failure, lung cancer and diabetes and  hypertension.  Has one daughter that has cancer as well.   PHYSICAL EXAMINATION:  GENERAL:  Alert, awake female, not in any distress.  VITAL SIGNS:  Blood pressure is 151/81, pulse 93,  respirations 18,  temperature 98.  She is saturating 98% on room air.  HEENT:  Head is normocephalic, atraumatic.  Pupils equal, round, and  reactive to light.  Mucous membranes moist and pink.  NECK:  Supple, no lymphadenopathy.  CARDIAC:  Heart is regular rate and rhythm, S1 and S2.  LUNGS:  Clear to auscultation, no rhonchi or wheeze or dullness to  percussion.  ABDOMEN:  Soft, nontender, no hepatosplenomegaly.  EXTREMITIES:  No clubbing, cyanosis, or edema.  NEUROLOGIC:  Intact motor, sensory and deep tendon reflexes.  SKIN:  Her abdominal incision appeared well-healed and nontender to touch.  No evidence of drainage.   LABORATORY DATA:  Hemoglobin of 12.2, white cells of 5.0, platelet count of  196.  Potassium of 3.8, creatinine of 0.8 and bilirubin of 0.4.  All liver  function tests otherwise normal.  Hemoglobin A1c was slightly elevated at  8.2.   ASSESSMENT AND PLAN:  This is a very pleasant 65 year old female with a  history of hypertension and diabetes, now with a new diagnosis of T3, N1  colon cancer of the ascending colon.  The patient had presented with  asymptomatic iron deficiency and had developed a mass in the ascending  colon.  The patient had an excellent surgery and an excellent lymph node  dissection with lymph sampling more than 12.  However, she did have one  positive lymph node, which put her at risk of systemic recurrence.  We had a  long discussion today discussing the risks and benefits of chemotherapy, and  I think she certainly would benefit from adjuvant chemotherapy.  I think she  has an excellent performance status that she could tolerate combination  chemotherapy using 5-FU, leucovorin and oxaliplatin-based regimen.  Currently I would like for her to recover more from surgery.  I will set her  up with a  follow-up in the next 2-3 weeks, probably plan chemotherapy in about 4-6 weeks after surgery.  Will evaluate her in the [facility] cancer center.  We   will set her up with an appointment before she leaves the hospital at this  point.   Thank you for allowing me to participate in her care.           ______________________________  Blenda Nicely. [facility]  Electronically Signed     FNS/MEDQ  D:  09/21/2005  T:  09/22/2005  Job:  [id number]   cc:   Currie Paris, M.D.  [address]  [place]  [place] [address]   Petra Kuba, M.D.  Fax: [phone]   Talmadge Coventry, M.D.  Fax: [phone]

## 2010-09-01 NOTE — Op Note (Signed)
[place]  Patient:    Carly Buckley, Carly Buckley                        MRN: [id number] Proc. Date: 11/10/99 Adm. Date:  [id number] Disc. Date: [id number] Attending:  Nelda Marseille                           Operative Report  PROCEDURE:  Colonoscopy.  INDICATIONS:  Patient with iron deficiency anemia, due for colonic screening. Consent was signed after risks, benefits, methods and options were earlier discussed in the office.  MEDICINES USED:  Demerol 75, Versed 7.  PROCEDURE IN DETAIL:  Rectal inspection was pertinent for external hemorrhoids.  Digital exam was negative.  Our medical resident was able to insert the scope to about 35 cm.  At that point there was looping.  I took over the controls and was easily able to advance to the cecum.  This did require some abdominal pressure but no position changes.  The cecum was identified by the appendiceal orifice and the ileocecal valve.  On insertion some left-sided diverticula were seen.  Entering the hepatic flexure, a 4 mm polyp was seen, was hot biopsied times one on insertion.  The scope was inserted short ways in the terminal ileum, which was normal.  Further documentation was obtained.  The scope was slowly withdrawn.  The prep was adequate.  There was some liquid stool that required washing and suctioning. The cecum and the ascending were normal.  We withdrew back to the polyp seen on insertion and additional hot biopsies were obtained.  Across the fold from this, another small 2 mm polyp was seen and was hot biopsied times one.  On slow withdrawal through the colon in the mid transverse, another 2 mm polyp was seen and was hot biopsied as well, and they were all put in the first container.  The scope was slowly withdrawn around the left side of the colon. In the mid descending another medium size polyp was seen about 6 mm and was hot biopsied times three and put in a second container.  This one  possibly was slightly lipomatous.  The scope was further withdrawn.  No other abnormalities were seen but rare left-sided diverticula as we slowly withdrew back to the rectum.  Once back in the rectum, the scope was retroflexed, pertinent for some internal hemorrhoids.  The scope was readvanced a short ways up the sigmoid.  Air was suctioned, scope removed.  The patient tolerated the procedure well.  There was no obvious immediate complication.  ENDOSCOPIC DIAGNOSES: 1.  Internal and external hemorrhoids. 2.  Left-sided diverticula. 3.  Four polyps hot biopsied in the descending, transverse and hepatic flexure. 4.  Otherwise within normal limits to the terminal ileum.  PLAN:  Customary one-week post polypectomy instructions.  Await pathology to determine future screening.  Okay to restart iron, and I will see her back p.r.n. or in six weeks to recheck symptoms, guaiac, CBC and decide any other workup and plans. DD:  11/10/99 TD:  11/12/99 Job: [id number]

## 2010-09-01 NOTE — Op Note (Signed)
Carly Buckley, Carly Buckley                 ACCOUNT NO.:  [id number]   MEDICAL RECORD NO.:  [id number]          PATIENT TYPE:  AMB   LOCATION:  DAY                          FACILITY:  [facility]   PHYSICIAN:  Currie Paris, M.D.DATE OF BIRTH:  08-15-1945   DATE OF PROCEDURE:  10/18/2005  DATE OF DISCHARGE:                                 OPERATIVE REPORT   PREOPERATIVE DIAGNOSIS:  Carcinoma of colon, inadequate venous access for  chemo.   POSTOPERATIVE DIAGNOSIS:  Carcinoma of colon, inadequate venous access for  chemo.   OPERATION:  Placement of Port-A-Cath.   SURGEON:  Currie Paris, M.D.   ANESTHESIA:  MAC.   CLINICAL HISTORY:  Ms. Neault is getting ready to have chemo for her colon  cancer.  She has very poor peripheral IV access, so Port-A-Cath was  requested.   DESCRIPTION OF PROCEDURE:  The patient seen in the holding area.  I reviewed  the indications, risks and complications with the patient and she had missed  her preoperative office appointment.  She had no further questions.  She  confirmed Port-A-Cath placement as the planned procedure.   The patient then taken to operating room and given IV sedation.  The upper  chest, lower neck areas were prepped and draped as a single sterile field.  The time-out occurred.   The patient placed in Trendelenburg.  Local anesthesia using 1% Xylocaine  was infiltrated in the left infraclavicular area and the left subclavian  vein entered on initial attempt.  The guidewire threaded easily and was  positioned in the superior vena cavae using fluoro control.  Additional  local infiltrated on the anterior chest wall.  A transverse incision was  made and a subcu pocket fashioned with cautery.   A tunnel was made using the tunneling device and the Port-A-Cath tubing  pulled through.  It was flushed.   The guidewire tract was dilated once with a #8 dilator peel-away sheath.  The dilator and guidewire removed and the catheter threaded  to approximately  22 cm.  The peel-away sheath was removed.   Using fluoro we saw that we were in the right atrium and this was backed up  into about the junction of the superior vena cava atrium.  It aspirated  irrigated easily.   The reservoir was flushed, attached and locking mechanism engaged.  It  aspirated irrigated easily.   And the reservoir was placed in a pocket and sutured down with 4-0 Vicryl.  Final fluoro check was made and everything appeared to be in good position.   The catheter was flushed again with dilute heparin followed by concentrated  aqueous heparin.  The incision was closed with 3-0 Vicryl, 4-0 Monocryl  subcuticular and Dermabond.   The patient tolerated procedure well.  There were no operative  complications.  All counts were correct.      Currie Paris, M.D.  Electronically Signed     CJS/MEDQ  D:  10/18/2005  T:  10/18/2005  Job:  [id number]   cc:   Talmadge Coventry, M.D.  Fax: [phone]

## 2011-01-10 ENCOUNTER — Other Ambulatory Visit: Payer: Self-pay | Admitting: Oncology

## 2011-01-10 ENCOUNTER — Encounter (HOSPITAL_BASED_OUTPATIENT_CLINIC_OR_DEPARTMENT_OTHER): Payer: Medicare Other | Admitting: Oncology

## 2011-01-10 DIAGNOSIS — C182 Malignant neoplasm of ascending colon: Secondary | ICD-10-CM

## 2011-01-10 DIAGNOSIS — G589 Mononeuropathy, unspecified: Secondary | ICD-10-CM

## 2011-01-10 DIAGNOSIS — C189 Malignant neoplasm of colon, unspecified: Secondary | ICD-10-CM

## 2011-01-10 LAB — CBC WITH DIFFERENTIAL/PLATELET
Basophils Absolute: 0 10*3/uL (ref 0.0–0.1)
EOS%: 5.5 % (ref 0.0–7.0)
HGB: 12.6 g/dL (ref 11.6–15.9)
MCH: 30.9 pg (ref 25.1–34.0)
MCV: 90.9 fL (ref 79.5–101.0)
MONO%: 4.7 % (ref 0.0–14.0)
NEUT#: 3.7 10*3/uL (ref 1.5–6.5)
RBC: 4.07 10*6/uL (ref 3.70–5.45)
RDW: 15.3 % — ABNORMAL HIGH (ref 11.2–14.5)
lymph#: 1.5 10*3/uL (ref 0.9–3.3)

## 2011-01-10 LAB — COMPREHENSIVE METABOLIC PANEL
ALT: 21 U/L (ref 0–35)
AST: 14 U/L (ref 0–37)
Albumin: 4.6 g/dL (ref 3.5–5.2)
Alkaline Phosphatase: 73 U/L (ref 39–117)
Calcium: 9.7 mg/dL (ref 8.4–10.5)
Chloride: 102 mEq/L (ref 96–112)
Potassium: 3.9 mEq/L (ref 3.5–5.3)
Sodium: 140 mEq/L (ref 135–145)
Total Protein: 7 g/dL (ref 6.0–8.3)

## 2011-03-21 ENCOUNTER — Encounter: Payer: Self-pay | Admitting: Family Medicine

## 2011-03-21 ENCOUNTER — Ambulatory Visit (INDEPENDENT_AMBULATORY_CARE_PROVIDER_SITE_OTHER): Payer: Medicare Other | Admitting: Family Medicine

## 2011-03-21 DIAGNOSIS — B86 Scabies: Secondary | ICD-10-CM | POA: Insufficient documentation

## 2011-03-21 DIAGNOSIS — E119 Type 2 diabetes mellitus without complications: Secondary | ICD-10-CM

## 2011-03-21 DIAGNOSIS — I10 Essential (primary) hypertension: Secondary | ICD-10-CM | POA: Insufficient documentation

## 2011-03-21 MED ORDER — METHYLPREDNISOLONE ACETATE 80 MG/ML IJ SUSP
80.0000 mg | Freq: Once | INTRAMUSCULAR | Status: AC
  Administered 2011-03-21: 80 mg via INTRAMUSCULAR

## 2011-03-21 MED ORDER — PREDNISONE 20 MG PO TABS: ORAL_TABLET | ORAL | Status: DC

## 2011-03-21 MED ORDER — GLUCOSE BLOOD VI STRP: ORAL_STRIP | Status: DC

## 2011-03-21 MED ORDER — PERMETHRIN 5 % EX CREA: TOPICAL_CREAM | Freq: Once | CUTANEOUS | Status: DC

## 2011-03-21 NOTE — Progress Notes (Signed)
  Subjective:    Patient ID: Carly Buckley, female    DOB: 09/23/1945, 65 y.o.   MRN: [id number]  HPI New to establish.  Previous MD- Cherylann Ratel then Mertha Finders.  GI- Magod, Onc- Shadad  HTN- chronic problem, on Norvasc and Lisinopril HCT.  Well controlled.  No CP, SOB, HAs, visual changes, edema.  DM- chronic problem, dx'd ~30 yrs ago.  Takes Amaryl 2 tabs daily.  Metformin 1000 QAM, 500 qnoon, 1000 QHS.  Last A1C was November.  Itching- has discussed this w/ Dr Duanne Guess.  sxs started 1 month ago.  [place] the woods.  sxs primarily on arms and chest.  Last night had sxs between her fingers.   Review of Systems For ROS see HPI     Objective:   Physical Exam  Vitals reviewed. Constitutional: She is oriented to person, place, and time. She appears well-developed and well-nourished. No distress.  HENT:  Head: Normocephalic and atraumatic.  Eyes: Conjunctivae and EOM are normal. Pupils are equal, round, and reactive to light.  Neck: Normal range of motion. Neck supple. No thyromegaly present.  Cardiovascular: Normal rate, regular rhythm, normal heart sounds and intact distal pulses.   Pulmonary/Chest: Effort normal and breath sounds normal. No respiratory distress.  Abdominal: Soft. She exhibits no distension. There is no tenderness.  Musculoskeletal: She exhibits no edema.  Lymphadenopathy:    She has no cervical adenopathy.  Neurological: She is alert and oriented to person, place, and time.  Skin: Skin is warm and dry. Rash (areas on forearms and interdigit webbing consistent w/ scabies) noted.  Psychiatric: She has a normal mood and affect. Her behavior is normal.          Assessment & Plan:

## 2011-03-21 NOTE — Patient Instructions (Signed)
Schedule your complete physical for February- do not eat before this appt Use the Elimite cream to treat the scabies- apply from your jaw down and sleep in it overnight before washing it off.  If you still have symptoms after 1 week- use the 2nd half of the tube Start the Prednisone tabs tomorrow morning- take both at the same time (w/ food) Call with any questions or concerns Welcome!  We're glad to have you!

## 2011-03-22 ENCOUNTER — Ambulatory Visit: Payer: Medicare Other | Admitting: Family Medicine

## 2011-03-23 ENCOUNTER — Other Ambulatory Visit: Payer: Self-pay | Admitting: Family Medicine

## 2011-03-23 NOTE — Telephone Encounter (Signed)
rx sent to pharmacy by e-script  

## 2011-04-01 NOTE — Assessment & Plan Note (Signed)
New.  Pt's rash and itching is consistent w/ scabies.  Start elimite and steroids for itching.  Reviewed supportive care and red flags that should prompt return.  Pt expressed understanding and is in agreement w/ plan.

## 2011-04-01 NOTE — Assessment & Plan Note (Signed)
Uncertain as to level of control b/c pt is overdue for labs and has not been following regularly.  Check labs.  Adjust meds prn.  Stressed importance of ADA diet and regular activity.  Will follow closely.

## 2011-04-01 NOTE — Assessment & Plan Note (Signed)
Chronic problem.  Well controlled on current meds.  Asymptomatic.  No changes at this time. 

## 2011-04-30 ENCOUNTER — Encounter: Payer: Self-pay | Admitting: Family Medicine

## 2011-04-30 ENCOUNTER — Ambulatory Visit (INDEPENDENT_AMBULATORY_CARE_PROVIDER_SITE_OTHER): Payer: Medicare Other | Admitting: Family Medicine

## 2011-04-30 DIAGNOSIS — R6889 Other general symptoms and signs: Secondary | ICD-10-CM

## 2011-04-30 DIAGNOSIS — B86 Scabies: Secondary | ICD-10-CM

## 2011-04-30 DIAGNOSIS — R196 Halitosis: Secondary | ICD-10-CM | POA: Insufficient documentation

## 2011-04-30 MED ORDER — ESTROGENS, CONJUGATED 0.625 MG/GM VA CREA: TOPICAL_CREAM | Freq: Every day | VAGINAL | Status: DC

## 2011-04-30 MED ORDER — LISINOPRIL-HYDROCHLOROTHIAZIDE 20-25 MG PO TABS: 1.0000 | ORAL_TABLET | Freq: Every day | ORAL | Status: DC

## 2011-04-30 MED ORDER — METFORMIN HCL 1000 MG PO TABS: 1000.0000 mg | ORAL_TABLET | Freq: Two times a day (BID) | ORAL | Status: DC

## 2011-04-30 MED ORDER — GLIMEPIRIDE 4 MG PO TABS: 4.0000 mg | ORAL_TABLET | Freq: Every day | ORAL | Status: DC

## 2011-04-30 MED ORDER — METFORMIN HCL 500 MG PO TABS: 500.0000 mg | ORAL_TABLET | Freq: Two times a day (BID) | ORAL | Status: DC

## 2011-04-30 NOTE — Patient Instructions (Signed)
We will call you with your derm appt for the continued itching Use benadryl as needed We'll notify you of your lab results If the labs are negative, we'll refer you back to Dr Ewing Schlein Call with any questions or concerns Hang in there!

## 2011-04-30 NOTE — Progress Notes (Signed)
  Subjective:    Patient ID: Carly Buckley, female    DOB: Dec 24, 1945, 65 y.o.   MRN: [id number]  HPI Scabies- itching has improved but still present.  Itching in axilla, back.  Groin itching has improved.  Completed tx w/ elimite.  No rash on skin.  Husband is not itching.  Has outdoor cat that she will pet.  Mouth odor- reports she has had this x1 yr.  Saw previous MD about this.  Feels sxs are coming from gut.  Had urease breath test- isn't aware of results.  Started on 14 days of Prevacid- no relief.  No abd pain.  Rare heartburn.  sxs are intermittent.  Has seen ENT and dentist- they cleared her.   Review of Systems For ROS see HPI     Objective:   Physical Exam  Vitals reviewed. Constitutional: She appears well-developed and well-nourished. No distress.  HENT:  Nose: Nose normal.  Mouth/Throat: Oropharynx is clear and moist. No oropharyngeal exudate.  Abdominal: Soft. Bowel sounds are normal. She exhibits no distension. There is no tenderness. There is no rebound and no guarding.  Skin: Skin is warm and dry. No rash noted. No erythema.          Assessment & Plan:

## 2011-05-01 ENCOUNTER — Telehealth: Payer: Self-pay | Admitting: *Deleted

## 2011-05-01 NOTE — Telephone Encounter (Signed)
Called pt to give lab results and to advise she needs to schedule an upcoming appt for DM per has not seen in office, pt advised that she made an upcoming ov on 05-24-11 at 8:30am, advised pt that she will need to be fasting, pt understood her lab results and transferred to referral dept per pt missed previous call about her dermotology appt

## 2011-05-13 NOTE — Assessment & Plan Note (Signed)
Pt has completed 2 rounds of tx w/ elimite w/out resolution of sxs.  Household contacts do not have similar sxs.  Due to persistent itching will refer to derm for evaluation and tx.  Pt expressed understanding and is in agreement w/ plan.

## 2011-05-13 NOTE — Assessment & Plan Note (Signed)
New.  Pt has been cleared by both ENT and dentist regarding oral issues.  She feels sxs are coming from gut but no relief w/ GERD meds.  Will check H pylori to r/o as possible cause.  If labs (-), she will need to call GI to set up appt.  Pt expressed understanding and is in agreement w/ plan.

## 2011-05-19 ENCOUNTER — Telehealth: Payer: Self-pay | Admitting: Oncology

## 2011-05-19 NOTE — Telephone Encounter (Signed)
Called pt, left message , lab abd CT on 07/11/11, NPO 4 hrs prior to CT. Pt will see md on 07/13/11

## 2011-05-24 ENCOUNTER — Ambulatory Visit (INDEPENDENT_AMBULATORY_CARE_PROVIDER_SITE_OTHER): Payer: Medicare Other | Admitting: Family Medicine

## 2011-05-24 ENCOUNTER — Encounter: Payer: Self-pay | Admitting: Family Medicine

## 2011-05-24 DIAGNOSIS — E119 Type 2 diabetes mellitus without complications: Secondary | ICD-10-CM

## 2011-05-24 DIAGNOSIS — I10 Essential (primary) hypertension: Secondary | ICD-10-CM

## 2011-05-24 DIAGNOSIS — R6889 Other general symptoms and signs: Secondary | ICD-10-CM

## 2011-05-24 DIAGNOSIS — Z780 Asymptomatic menopausal state: Secondary | ICD-10-CM

## 2011-05-24 DIAGNOSIS — Z1231 Encounter for screening mammogram for malignant neoplasm of breast: Secondary | ICD-10-CM

## 2011-05-24 DIAGNOSIS — Z0000 Encounter for general adult medical examination without abnormal findings: Secondary | ICD-10-CM | POA: Insufficient documentation

## 2011-05-24 DIAGNOSIS — R196 Halitosis: Secondary | ICD-10-CM

## 2011-05-24 NOTE — Patient Instructions (Addendum)
Please schedule your diabetes follow up in 3 months We'll notify you of your lab results and make any changes if needed Someone will call you with your bone density and mammogram appt Someone will call you with your GI appt Start the Nexium daily and see if your symptoms improve Call with any questions or concerns Happy Valentine's Day!

## 2011-05-24 NOTE — Progress Notes (Signed)
  Subjective:    Patient ID: Carly Buckley, female    DOB: Aug 21, 1945, 66 y.o.   MRN: [id number]  HPI Here today for CPE.  Risk Factors: DM- chronic problem, on Metformin and Amaryl.  Rarely checking CBGs.  Denies symptomatic lows.  UTD on eye exam.  No N/V/D.  abd pain.  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 HTN- chronic problem, on lisinopril HCTZ and amlodipine.  Denies CP, SOB, HAs, visual changes, edema Physical Activity: little activity due to 'bad knees' Fall Risk: low risk, very steady on feet Depression: no concerns Hearing: decreased to whispered voice ADL's: independent Cognitive: normal linear thought process, no deficits in attention or memory Home Safety: feels safe at home Height, Weight, BMI, Visual Acuity: see vitals, vision corrected to 20/20 w/ glasses Counseling: UTD on pap (last year), last mammo and DEXA 2010.  Uncertain as to date of Pneumovax, UTD on colonoscopy (Dr Ewing Schlein) Labs Ordered: See A&P Care Plan: See A&P    Review of Systems Patient reports no vision/ hearing changes, adenopathy,fever, weight change,  persistant/recurrent hoarseness , swallowing issues, chest pain, palpitations, edema, persistant/recurrent cough, hemoptysis, dyspnea (rest/exertional/paroxysmal nocturnal), gastrointestinal bleeding (melena, rectal bleeding), abdominal pain, significant heartburn, bowel changes, GU symptoms (dysuria, hematuria, incontinence), Gyn symptoms (abnormal  bleeding, pain),  syncope, focal weakness, memory loss, skin/hair/nail changes, abnormal bruising or bleeding, anxiety, or depression.     Objective:   Physical Exam  General Appearance:    Alert, cooperative, no distress, appears stated age  Head:    Normocephalic, without obvious abnormality, atraumatic  Eyes:    PERRL, conjunctiva/corneas clear, EOM's intact, fundi    benign, both eyes  Ears:    Normal TM's and external ear canals, both ears  Nose:   Nares normal, septum midline, mucosa  normal, no drainage    or sinus tenderness  Throat:   Lips, mucosa, and tongue normal; teeth and gums normal  Neck:   Supple, symmetrical, trachea midline, no adenopathy;    Thyroid: no enlargement/tenderness/nodules  Back:     Symmetric, no curvature, ROM normal, no CVA tenderness  Lungs:     Clear to auscultation bilaterally, respirations unlabored  Chest Wall:    No tenderness or deformity   Heart:    Regular rate and rhythm, S1 and S2 normal, no murmur, rub   or gallop  Breast Exam:    No tenderness, masses, or nipple abnormality  Abdomen:     Soft, non-tender, bowel sounds active all four quadrants,    no masses, no organomegaly  Genitalia:    Deferred at pt's request  Rectal:    Extremities:   Extremities normal, atraumatic, no cyanosis or edema  Pulses:   2+ and symmetric all extremities  Skin:   Skin color, texture, turgor normal, no rashes or lesions  Lymph nodes:   Cervical, supraclavicular, and axillary nodes normal  Neurologic:   CNII-XII intact, normal strength, sensation and reflexes    throughout          Assessment & Plan:

## 2011-05-25 ENCOUNTER — Other Ambulatory Visit: Payer: Self-pay | Admitting: Family Medicine

## 2011-05-28 ENCOUNTER — Other Ambulatory Visit (INDEPENDENT_AMBULATORY_CARE_PROVIDER_SITE_OTHER): Payer: Medicare Other

## 2011-05-28 DIAGNOSIS — E119 Type 2 diabetes mellitus without complications: Secondary | ICD-10-CM

## 2011-05-28 DIAGNOSIS — Z0000 Encounter for general adult medical examination without abnormal findings: Secondary | ICD-10-CM

## 2011-05-28 LAB — HEPATIC FUNCTION PANEL
ALT: 16 U/L (ref 0–35)
Bilirubin, Direct: 0 mg/dL (ref 0.0–0.3)
Total Bilirubin: 0.5 mg/dL (ref 0.3–1.2)

## 2011-05-28 LAB — BASIC METABOLIC PANEL
BUN: 17 mg/dL (ref 6–23)
Calcium: 9.5 mg/dL (ref 8.4–10.5)
Chloride: 99 mEq/L (ref 96–112)
Creatinine, Ser: 0.6 mg/dL (ref 0.4–1.2)
GFR: 115.3 mL/min (ref >60.00)

## 2011-05-28 LAB — CBC WITH DIFFERENTIAL/PLATELET
Basophils Absolute: 0 10*3/uL (ref 0.0–0.1)
Eosinophils Absolute: 0.5 10*3/uL (ref 0.0–0.7)
Hemoglobin: 12.1 g/dL (ref 12.0–15.0)
Lymphocytes Relative: 23.4 % (ref 12.0–46.0)
MCHC: 33.3 g/dL (ref 30.0–36.0)
MCV: 94.9 fl (ref 78.0–100.0)
Monocytes Absolute: 0.3 10*3/uL (ref 0.1–1.0)
Neutro Abs: 3.7 10*3/uL (ref 1.4–7.7)
RDW: 15.4 % — ABNORMAL HIGH (ref 11.5–14.6)

## 2011-05-28 LAB — LIPID PANEL
Cholesterol: 180 mg/dL (ref 0–200)
HDL: 68.1 mg/dL (ref >39.00)
LDL Cholesterol: 103 mg/dL — ABNORMAL HIGH (ref 0–99)
Total CHOL/HDL Ratio: 3
Triglycerides: 46 mg/dL (ref 0.0–149.0)

## 2011-06-11 ENCOUNTER — Other Ambulatory Visit: Payer: Self-pay | Admitting: *Deleted

## 2011-06-11 MED ORDER — GLIMEPIRIDE 4 MG PO TABS: 4.0000 mg | ORAL_TABLET | Freq: Every day | ORAL | Status: DC

## 2011-06-11 NOTE — Telephone Encounter (Signed)
1 tab of Amaryl 4mg 

## 2011-06-11 NOTE — Telephone Encounter (Signed)
Please clarify if Pt is to take 2 tab of amaryl 4mg  or 1 tab.

## 2011-06-11 NOTE — Telephone Encounter (Signed)
Rx sent, Discuss with Kathrynn Ducking.

## 2011-06-12 NOTE — Assessment & Plan Note (Addendum)
New.  Pt's PE WNL.  Overdue on mammo and DEXA- referrals made.  Anticipatory guidance provided.

## 2011-06-12 NOTE — Assessment & Plan Note (Signed)
Refer to GI for complete evaluation.  Start PPI in the interim.  Pt expressed understanding and is in agreement w/ plan.

## 2011-06-12 NOTE — Assessment & Plan Note (Signed)
Chronic problem.  Not checking CBGs.  UTD on eye exam.  Asymptomatic.  Check labs.  Adjust meds prn.

## 2011-06-12 NOTE — Assessment & Plan Note (Signed)
Chronic problem.  Adequate control.  Asymptomatic.  No changes. 

## 2011-06-19 ENCOUNTER — Ambulatory Visit
Admission: RE | Admit: 2011-06-19 | Discharge: 2011-06-19 | Disposition: A | Discharge status: Home / Self Care | Payer: Medicare Other | Source: Ambulatory Visit | Attending: Family Medicine | Admitting: Family Medicine

## 2011-06-19 ENCOUNTER — Telehealth: Payer: Self-pay

## 2011-06-19 DIAGNOSIS — Z780 Asymptomatic menopausal state: Secondary | ICD-10-CM

## 2011-06-19 DIAGNOSIS — Z1231 Encounter for screening mammogram for malignant neoplasm of breast: Secondary | ICD-10-CM

## 2011-06-19 NOTE — Telephone Encounter (Signed)
Patient said she was returning your call. 

## 2011-06-20 NOTE — Telephone Encounter (Signed)
Spoke to pt to advise results/instructions. Pt understood.  

## 2011-06-25 ENCOUNTER — Ambulatory Visit (INDEPENDENT_AMBULATORY_CARE_PROVIDER_SITE_OTHER)
Admission: RE | Admit: 2011-06-25 | Discharge: 2011-06-25 | Disposition: A | Discharge status: Home / Self Care | Payer: Medicare Other | Source: Ambulatory Visit | Attending: Family Medicine | Admitting: Family Medicine

## 2011-06-25 DIAGNOSIS — Z0000 Encounter for general adult medical examination without abnormal findings: Secondary | ICD-10-CM

## 2011-06-29 ENCOUNTER — Telehealth: Payer: Self-pay | Admitting: *Deleted

## 2011-06-29 NOTE — Telephone Encounter (Signed)
Called pt to advise that we have received her bone scan results back to advise it was normal, left vm to call office per noted in faxed form viewed by MD Beverely Low,

## 2011-07-02 ENCOUNTER — Telehealth: Payer: Self-pay | Admitting: Family Medicine

## 2011-07-02 MED ORDER — LISINOPRIL-HYDROCHLOROTHIAZIDE 20-25 MG PO TABS: 1.0000 | ORAL_TABLET | Freq: Every day | ORAL | Status: DC

## 2011-07-02 MED ORDER — METFORMIN HCL 1000 MG PO TABS: 1000.0000 mg | ORAL_TABLET | Freq: Two times a day (BID) | ORAL | Status: DC

## 2011-07-02 NOTE — Telephone Encounter (Signed)
Refill- lisino-hctz 20-25mg  tab. Take one tablet by mouth every day. Qty 30 last fill 2.19.13  Refill- metformin  1000mg  tab. Take one tablet by mouth twice daily with meals. Qty 60 last fill 2.19.13

## 2011-07-03 NOTE — Progress Notes (Signed)
Patient's husband called to cancel appointments scheduled for patient on 3/27 and 3/29; states that patient is sick and cannot come next week; note to schedulers to contact patient to reschedule appointments.

## 2011-07-04 ENCOUNTER — Telehealth: Payer: Self-pay | Admitting: Oncology

## 2011-07-04 NOTE — Telephone Encounter (Signed)
pts husband rtn call and scheduled pt for ct scan on 04/08 @ [facility] and f/u on 04/11.

## 2011-07-04 NOTE — Telephone Encounter (Signed)
pt called nurse and asked that we cancelled appt for 03/27-03/29.  rtn all to pt and asked that she rtn call to r/s appts

## 2011-07-11 ENCOUNTER — Other Ambulatory Visit (HOSPITAL_COMMUNITY): Payer: Medicare Other

## 2011-07-11 ENCOUNTER — Other Ambulatory Visit: Payer: Medicare Other | Admitting: Lab

## 2011-07-12 ENCOUNTER — Other Ambulatory Visit: Payer: Self-pay | Admitting: *Deleted

## 2011-07-12 MED ORDER — AMLODIPINE BESYLATE 10 MG PO TABS: 10.0000 mg | ORAL_TABLET | Freq: Every day | ORAL | Status: DC

## 2011-07-12 NOTE — Telephone Encounter (Signed)
Rx sent 

## 2011-07-13 ENCOUNTER — Ambulatory Visit: Payer: Medicare Other | Admitting: Oncology

## 2011-07-18 ENCOUNTER — Telehealth: Payer: Self-pay | Admitting: *Deleted

## 2011-07-18 NOTE — Telephone Encounter (Signed)
letter mailed to patients home address with results for bone density scan per noted as normal.

## 2011-07-23 ENCOUNTER — Ambulatory Visit (HOSPITAL_COMMUNITY)
Admission: RE | Admit: 2011-07-23 | Discharge: 2011-07-23 | Disposition: A | Discharge status: Home / Self Care | Payer: Medicare Other | Source: Ambulatory Visit | Attending: Oncology | Admitting: Oncology

## 2011-07-23 ENCOUNTER — Other Ambulatory Visit: Payer: Medicare Other | Admitting: Lab

## 2011-07-23 DIAGNOSIS — Z9221 Personal history of antineoplastic chemotherapy: Secondary | ICD-10-CM | POA: Insufficient documentation

## 2011-07-23 DIAGNOSIS — C189 Malignant neoplasm of colon, unspecified: Secondary | ICD-10-CM | POA: Insufficient documentation

## 2011-07-23 DIAGNOSIS — I2510 Atherosclerotic heart disease of native coronary artery without angina pectoris: Secondary | ICD-10-CM | POA: Insufficient documentation

## 2011-07-23 DIAGNOSIS — N289 Disorder of kidney and ureter, unspecified: Secondary | ICD-10-CM | POA: Insufficient documentation

## 2011-07-23 DIAGNOSIS — Z980 Intestinal bypass and anastomosis status: Secondary | ICD-10-CM | POA: Insufficient documentation

## 2011-07-23 LAB — CMP (CANCER CENTER ONLY)
ALT(SGPT): 20 U/L (ref 10–47)
AST: 18 U/L (ref 11–38)
BUN, Bld: 22 mg/dL (ref 7–22)
Calcium: 9.5 mg/dL (ref 8.0–10.3)
Creat: 1 mg/dl (ref 0.6–1.2)
Total Bilirubin: 0.5 mg/dl (ref 0.20–1.60)

## 2011-07-23 LAB — CBC WITH DIFFERENTIAL/PLATELET
BASO%: 0.7 % (ref 0.0–2.0)
EOS%: 5.8 % (ref 0.0–7.0)
HCT: 38.4 % (ref 34.8–46.6)
LYMPH%: 20.5 % (ref 14.0–49.7)
MCH: 30.4 pg (ref 25.1–34.0)
MCHC: 32.9 g/dL (ref 31.5–36.0)
MCV: 92.3 fL (ref 79.5–101.0)
MONO#: 0.4 10*3/uL (ref 0.1–0.9)
MONO%: 4.9 % (ref 0.0–14.0)
NEUT%: 68.1 % (ref 38.4–76.8)
Platelets: 202 10*3/uL (ref 145–400)
RBC: 4.16 10*6/uL (ref 3.70–5.45)
WBC: 7.6 10*3/uL (ref 3.9–10.3)
nRBC: 0 % (ref 0–0)

## 2011-07-23 MED ORDER — IOHEXOL 300 MG/ML  SOLN
100.0000 mL | Freq: Once | INTRAMUSCULAR | Status: AC | PRN
  Administered 2011-07-23: 100 mL via INTRAVENOUS

## 2011-07-24 ENCOUNTER — Encounter: Payer: Self-pay | Admitting: Family Medicine

## 2011-07-25 ENCOUNTER — Ambulatory Visit: Payer: Medicare Other | Admitting: Oncology

## 2011-07-25 ENCOUNTER — Encounter: Payer: Self-pay | Admitting: *Deleted

## 2011-07-26 ENCOUNTER — Ambulatory Visit (HOSPITAL_BASED_OUTPATIENT_CLINIC_OR_DEPARTMENT_OTHER): Payer: Medicare Other | Admitting: Oncology

## 2011-07-26 ENCOUNTER — Telehealth: Payer: Self-pay | Admitting: Oncology

## 2011-07-26 VITALS — BP 138/65 | HR 91 | Temp 97.3°F | Ht 66.0 in | Wt 187.8 lb

## 2011-07-26 DIAGNOSIS — C189 Malignant neoplasm of colon, unspecified: Secondary | ICD-10-CM

## 2011-07-26 NOTE — Telephone Encounter (Signed)
appts made and printed for pt aom °

## 2011-07-26 NOTE — Progress Notes (Signed)
Hematology and Oncology Follow Up Visit  NITZA SCHMID [id number] 02-Mar-1946 66 y.o. 07/26/2011 3:08 PM  CC: Maryelizabeth Rowan, M.D.  Currie Paris, M.D.  Petra Kuba, M.D.    Principle Diagnosis: This is a 66 year old gentleman with the diagnosis of colon cancer diagnosed in June 2007, T3 N1 right-sided colon cancer.   Prior Therapy:  1. She had laparoscopically-assisted hemicolectomy done on September 19, 2005.  Had 2 cm tumor with 1/24 lymph nodes involved. 2. The patient received adjuvant FOLFOX chemotherapy utilizing 5-FU, leucovorin, and oxaliplatin therapy.  Therapy concluded in December 2007.  Therapy complicated by mild neutropenia and peripheral neuropathy.  Current therapy: Observation and surveillance.  Interim History:  Mrs. Speranza presents today for a followup visit.  66 year old woman with history of stage III colon cancer was diagnosed initially in June 2007, received adjuvant chemotherapy concluded in December 2007, and has had really no signs of cancer recurrence at this time.  She continued cancer surveillance without any evidence to suggest recurrent disease on her last CT scan done in 4/ 2013.  At that time, she had really no evidence of any tumor recurrence.  She is up to speed on her colonoscopies and continues to be asymptomatic.  She is not reporting any abdominal pain.  Does not report any hematochezia.  Does not report any melena.  Did not report any major changes in her clinical status at this point.  She still has some residual grade one peripheral neuropathy that is unchanged.  Medications: I have reviewed the patient's current medications. Current outpatient prescriptions:amLODipine (NORVASC) 10 MG tablet, Take 1 tablet (10 mg total) by mouth daily., Disp: 30 tablet, Rfl: 2;  aspirin 81 MG tablet, Take 81 mg by mouth daily.  , Disp: , Rfl: ;  calcium-vitamin D (OSCAL WITH D) 500-200 MG-UNIT per tablet, Take 1 tablet by mouth daily.  , Disp: , Rfl: ;  conjugated  estrogens (PREMARIN) vaginal cream, Place vaginally daily., Disp: 42.5 g, Rfl: 1 glimepiride (AMARYL) 4 MG tablet, Take 1 tablet (4 mg total) by mouth daily before breakfast., Disp: 30 tablet, Rfl: 2;  glucose blood test strip, Use as instructed, Disp: 100 each, Rfl: 12;  lisinopril-hydrochlorothiazide (PRINZIDE,ZESTORETIC) 20-25 MG per tablet, Take 1 tablet by mouth daily., Disp: 30 tablet, Rfl: 1 metFORMIN (GLUCOPHAGE) 1000 MG tablet, Take 1 tablet (1,000 mg total) by mouth 2 (two) times daily with a meal., Disp: 60 tablet, Rfl: 1;  metFORMIN (GLUCOPHAGE) 500 MG tablet, Take 1 tablet (500 mg total) by mouth 2 (two) times daily with a meal., Disp: 60 tablet, Rfl: 1;  Multiple Vitamin (MULTIVITAMIN) tablet, Take 1 tablet by mouth daily.  , Disp: , Rfl:  nystatin-triamcinolone ointment (MYCOLOG), Apply topically 2 (two) times daily.  , Disp: , Rfl: ;  permethrin (ELIMITE) 5 % cream, APPLY TOPICALLY ONCE, Disp: 60 g, Rfl: 0  Allergies:  Allergies  Allergen Reactions  . Morphine And Related     Nauseated     Past Medical History, Surgical history, Social history, and Family History were reviewed and updated.  Review of Systems: Constitutional:  Negative for fever, chills, night sweats, anorexia, weight loss, pain. Cardiovascular: no chest pain or dyspnea on exertion Respiratory: negative Neurological: negative Dermatological: negative ENT: negative Skin: Negative. Gastrointestinal: no abdominal pain, change in bowel habits, or black or bloody stools Genito-Urinary: negative Hematological and Lymphatic: negative Breast: negative Musculoskeletal: negative Remaining ROS negative. Physical Exam: Blood pressure 138/65, pulse 91, temperature 97.3 F (36.3 C), temperature source  Oral, height 5\' 6"  (1.676 m), weight 187 lb 12.8 oz (85.186 kg). ECOG: 1 General appearance: alert Head: Normocephalic, without obvious abnormality, atraumatic Neck: no adenopathy, no carotid bruit, no JVD, supple,  symmetrical, trachea midline and thyroid not enlarged, symmetric, no tenderness/mass/nodules Lymph nodes: Cervical, supraclavicular, and axillary nodes normal. Heart:regular rate and rhythm, S1, S2 normal, no murmur, click, rub or gallop Lung:chest clear, no wheezing, rales, normal symmetric air entry Abdomin: soft, non-tender, without masses or organomegaly EXT:no erythema, induration, or nodules   Lab Results: Lab Results  Component Value Date   WBC 7.6 07/23/2011   HGB 12.6 07/23/2011   HCT 38.4 07/23/2011   MCV 92.3 07/23/2011   PLT 202 07/23/2011     Chemistry      Component Value Date/Time   NA 145 07/23/2011 1101   NA 134* 05/28/2011 0947   K 3.9 07/23/2011 1101   K 3.8 05/28/2011 0947   CL 100 07/23/2011 1101   CL 99 05/28/2011 0947   CO2 27 07/23/2011 1101   CO2 28 05/28/2011 0947   BUN 22 07/23/2011 1101   BUN 17 05/28/2011 0947   CREATININE 1.0 07/23/2011 1101   CREATININE 0.6 05/28/2011 0947      Component Value Date/Time   CALCIUM 9.5 07/23/2011 1101   CALCIUM 9.5 05/28/2011 0947   ALKPHOS 71 07/23/2011 1101   ALKPHOS 61 05/28/2011 0947   AST 18 07/23/2011 1101   AST 15 05/28/2011 0947   ALT 16 05/28/2011 0947   BILITOT 0.50 07/23/2011 1101   BILITOT 0.5 05/28/2011 0947     CT CHEST, ABDOMEN AND PELVIS WITH CONTRAST  Technique: Multidetector CT imaging of the chest, abdomen and  pelvis was performed following the standard protocol during bolus  administration of intravenous contrast.  Contrast: OMNIPAQUE IOHEXOL 300 MG/ML SOLN  Comparison: CT 06/27/2010  CT CHEST  Findings: No axillary or supraclavicular lymphadenopathy. No  mediastinal or hilar lymphadenopathy. Coronary calcifications are  present.  Review of the lung parenchyma demonstrates no new or suspicious  pulmonary nodules. Airways are normal.  IMPRESSION:  No evidence of thoracic metastasis.  Coronary artery calcifications.  CT ABDOMEN AND PELVIS  Findings: No focal hepatic lesion. Gallbladder, pancreas, spleen,    adrenal glands, kidneys are unchanged. There is a fatty lesion  extending from the cortex of the right kidney which is stable over  multiple comparison exams back to 2009.  The stomach, small bowel and small bowel are normal. There is an  enteric colonic anastomoses in the right upper quadrant consistent  with right hemicolectomy. No nodularity or obstruction present.  Small 6 mm lymph node adjacent to the anastomosis (image 71)  compares to 5 mm on prior. The distal colon and sigmoid colon are  unchanged. There are diverticula the sigmoid colon.  Abdominal aorta normal caliber. No retroperitoneal periportal  lymphadenopathy. No mesenteric lymphadenopathy is evident.  Periportal lymph nodes are stable in size.  No free fluid the pelvis. The bladder and uterus are normal. No  pelvic lymphadenopathy. Review of bone windows demonstrates no  aggressive osseous lesions.  IMPRESSION:  1. No evidence metastasis in the abdomen or pelvis.  2. Small lymph node adjacent to the anastomosis is not  significantly changed. Recommend attention on follow-up.  3. Stable periportal lymph nodes.   Impression and Plan:  This is a pleasant 66 year old female with the following issues. 1. Stage III colon cancer, T3 N1 disease.  She is over 5 years out from her surgery, approaching 5  years out after conclusion of her chemotherapy.  No evidence to suggest recurrent disease.  Her CT scan reviewed today and continue to show no relapse at this time. The plan is to ask her to come back in 12 months' time and repeat imaging studies as needed.  Also repeat liver function tests and CEA.   2. Colonoscopic screening.  Last time was done 2011 and followed up with Dr. Ewing Schlein regarding that. 3. Peripheral neuropathy.  Seems to have resolved at this time and very faint at this time.  [facility], MD 4/11/20133:08 PM

## 2011-08-27 ENCOUNTER — Ambulatory Visit: Payer: Medicare Other | Admitting: Family Medicine

## 2011-09-04 ENCOUNTER — Other Ambulatory Visit: Payer: Self-pay | Admitting: Family Medicine

## 2011-09-05 ENCOUNTER — Other Ambulatory Visit: Payer: Self-pay | Admitting: Family Medicine

## 2011-09-05 MED ORDER — GLIMEPIRIDE 4 MG PO TABS: 4.0000 mg | ORAL_TABLET | Freq: Every day | ORAL | Status: DC

## 2011-09-05 NOTE — Telephone Encounter (Signed)
rx sent to pharmacy by e-script  

## 2011-09-07 NOTE — Telephone Encounter (Signed)
receivied 2nd request from [facility] [id number] For refill on  LISINO-HCTZ 20-25mg  tab Qty 30 Take one tablet by mouth every day  Last filled 4.19.13

## 2011-09-07 NOTE — Telephone Encounter (Signed)
rx sent to pharmacy by e-script  

## 2011-09-13 ENCOUNTER — Ambulatory Visit (INDEPENDENT_AMBULATORY_CARE_PROVIDER_SITE_OTHER): Payer: Medicare Other | Admitting: Family Medicine

## 2011-09-13 ENCOUNTER — Encounter: Payer: Self-pay | Admitting: Family Medicine

## 2011-09-13 VITALS — BP 130/76 | HR 95 | Temp 98.0°F | Ht 65.5 in | Wt 187.8 lb

## 2011-09-13 DIAGNOSIS — I10 Essential (primary) hypertension: Secondary | ICD-10-CM

## 2011-09-13 DIAGNOSIS — E119 Type 2 diabetes mellitus without complications: Secondary | ICD-10-CM

## 2011-09-13 DIAGNOSIS — IMO0001 Reserved for inherently not codable concepts without codable children: Secondary | ICD-10-CM

## 2011-09-13 LAB — BASIC METABOLIC PANEL
BUN: 17 mg/dL (ref 6–23)
CO2: 24 mEq/L (ref 19–32)
Chloride: 102 mEq/L (ref 96–112)
Creatinine, Ser: 0.6 mg/dL (ref 0.4–1.2)
Potassium: 3.9 mEq/L (ref 3.5–5.1)

## 2011-09-13 LAB — HEMOGLOBIN A1C: Hgb A1c MFr Bld: 7.5 % — ABNORMAL HIGH (ref 4.6–6.5)

## 2011-09-13 NOTE — Assessment & Plan Note (Signed)
Chronic problem.  Stable.  Adequate control.  Asymptomatic.  No changes.

## 2011-09-13 NOTE — Progress Notes (Signed)
  Subjective:    Patient ID: Carly Buckley, female    DOB: Nov 04, 1945, 66 y.o.   MRN: [id number]  HPI HTN- chronic problem, adequate control today.  On Norvasc and Lisinopril HCT.  No CP, SOB, HAs, visual changes, edema.  DM- chronic problem, overdue on eye exam.  Plans to schedule.  On amaryl and metformin.  Not checking sugars regularly.  Denies symptomatic lows.  Last check was 130.   Review of Systems For ROS see HPI     Objective:   Physical Exam  Vitals reviewed. Constitutional: She is oriented to person, place, and time. She appears well-developed and well-nourished. No distress.  HENT:  Head: Normocephalic and atraumatic.  Eyes: Conjunctivae and EOM are normal. Pupils are equal, round, and reactive to light.  Neck: Normal range of motion. Neck supple. No thyromegaly present.  Cardiovascular: Normal rate, regular rhythm, normal heart sounds and intact distal pulses.   No murmur heard. Pulmonary/Chest: Effort normal and breath sounds normal. No respiratory distress.  Abdominal: Soft. She exhibits no distension. There is no tenderness.  Musculoskeletal: She exhibits no edema.  Lymphadenopathy:    She has no cervical adenopathy.  Neurological: She is alert and oriented to person, place, and time.  Skin: Skin is warm and dry.  Psychiatric: She has a normal mood and affect. Her behavior is normal.          Assessment & Plan:

## 2011-09-13 NOTE — Assessment & Plan Note (Signed)
Chronic problem.  Uncertain of control.  Due for labs.  Encouraged eye exam- pt to schedule.  Foot exam performed.  Check labs.  Adjust meds prn

## 2011-09-13 NOTE — Patient Instructions (Signed)
Schedule an appt in 3 months to recheck diabetes and cholesterol- don't eat before this We'll notify you of your lab results and make any changes if needed You look great!  Keep up the good work! Call with any questions or concerns Have a great summer!!!

## 2011-09-19 ENCOUNTER — Encounter: Payer: Self-pay | Admitting: Family Medicine

## 2011-09-21 ENCOUNTER — Encounter: Payer: Self-pay | Admitting: *Deleted

## 2011-10-16 ENCOUNTER — Other Ambulatory Visit: Payer: Self-pay | Admitting: Family Medicine

## 2011-10-17 NOTE — Telephone Encounter (Signed)
rx sent to pharmacy by e-script  

## 2011-10-19 ENCOUNTER — Other Ambulatory Visit: Payer: Self-pay | Admitting: Family Medicine

## 2011-10-19 MED ORDER — AMLODIPINE BESYLATE 10 MG PO TABS: 10.0000 mg | ORAL_TABLET | Freq: Every day | ORAL | Status: DC

## 2011-10-19 NOTE — Telephone Encounter (Signed)
refill norvasc 10mg  tab #30, take one tablet by mouth every day, last fill 6.3.13, last ov 5.30.13

## 2011-10-19 NOTE — Telephone Encounter (Signed)
Refill done.  

## 2011-11-15 ENCOUNTER — Telehealth: Payer: Self-pay | Admitting: Family Medicine

## 2011-11-15 MED ORDER — AMLODIPINE BESYLATE 10 MG PO TABS: 10.0000 mg | ORAL_TABLET | Freq: Every day | ORAL | Status: DC

## 2011-11-15 NOTE — Telephone Encounter (Signed)
rx sent to pharmacy by e-script  

## 2011-11-15 NOTE — Telephone Encounter (Signed)
Refill: Norvasc 10mg . Take one tablet by mouth every day. Qty 30. Last fill 10-19-11

## 2011-12-03 ENCOUNTER — Other Ambulatory Visit: Payer: Self-pay | Admitting: Family Medicine

## 2011-12-03 NOTE — Telephone Encounter (Signed)
Refill done.  

## 2011-12-14 ENCOUNTER — Encounter: Payer: Self-pay | Admitting: Family Medicine

## 2011-12-14 ENCOUNTER — Ambulatory Visit (INDEPENDENT_AMBULATORY_CARE_PROVIDER_SITE_OTHER): Payer: Medicare Other | Admitting: Family Medicine

## 2011-12-14 ENCOUNTER — Encounter: Payer: Self-pay | Admitting: *Deleted

## 2011-12-14 VITALS — BP 128/82 | HR 88 | Temp 98.2°F | Ht 65.25 in | Wt 190.2 lb

## 2011-12-14 DIAGNOSIS — E119 Type 2 diabetes mellitus without complications: Secondary | ICD-10-CM

## 2011-12-14 LAB — HEPATIC FUNCTION PANEL
ALT: 20 U/L (ref 0–35)
AST: 18 U/L (ref 0–37)
Bilirubin, Direct: 0 mg/dL (ref 0.0–0.3)
Total Bilirubin: 0.5 mg/dL (ref 0.3–1.2)
Total Protein: 8.1 g/dL (ref 6.0–8.3)

## 2011-12-14 LAB — BASIC METABOLIC PANEL
BUN: 21 mg/dL (ref 6–23)
CO2: 26 mEq/L (ref 19–32)
Chloride: 101 mEq/L (ref 96–112)
Creatinine, Ser: 0.7 mg/dL (ref 0.4–1.2)
Potassium: 4.4 mEq/L (ref 3.5–5.1)

## 2011-12-14 LAB — LIPID PANEL
LDL Cholesterol: 93 mg/dL (ref 0–99)
Total CHOL/HDL Ratio: 3

## 2011-12-14 LAB — HEMOGLOBIN A1C: Hgb A1c MFr Bld: 7.4 % — ABNORMAL HIGH (ref 4.6–6.5)

## 2011-12-14 NOTE — Progress Notes (Signed)
  Subjective:    Patient ID: Carly Buckley, female    DOB: November 30, 1945, 66 y.o.   MRN: [id number]  HPI DM- chronic problem, on Amaryl and Metformin (1000, 500, 1000mg  daily).  Not routinely checking CBGs, last check was 130.  Overdue on eye exam.  Some persistent numbness since undergoing chemo for colon cancer.  Denies symptomatic lows.  No CP, SOB, HAs, visual changes, edema.   Review of Systems For ROS see HPI     Objective:   Physical Exam  Vitals reviewed. Constitutional: She is oriented to person, place, and time. She appears well-developed and well-nourished. No distress.  HENT:  Head: Normocephalic and atraumatic.  Eyes: Conjunctivae and EOM are normal. Pupils are equal, round, and reactive to light.  Neck: Normal range of motion. Neck supple. No thyromegaly present.  Cardiovascular: Normal rate, regular rhythm, normal heart sounds and intact distal pulses.   No murmur heard. Pulmonary/Chest: Effort normal and breath sounds normal. No respiratory distress.  Abdominal: Soft. She exhibits no distension. There is no tenderness.  Musculoskeletal: She exhibits no edema.  Lymphadenopathy:    She has no cervical adenopathy.  Neurological: She is alert and oriented to person, place, and time.  Skin: Skin is warm and dry.  Psychiatric: She has a normal mood and affect. Her behavior is normal.          Assessment & Plan:

## 2011-12-14 NOTE — Patient Instructions (Addendum)
Follow up in 3 months to recheck diabetes You look great!  Keep up the good work! Schedule your eye exam We'll notify you of your lab results and make any changes if needed Call with any questions or concerns Happy Labor Day!!!

## 2011-12-21 NOTE — Assessment & Plan Note (Signed)
Chronic problem.  Typically well controlled.  Pt tolerating meds w/out difficulty.  Encouraged ADA diet and regular exercise.  Pt to call and schedule eye exam.  Check labs.  Adjust meds prn

## 2011-12-24 ENCOUNTER — Other Ambulatory Visit: Payer: Self-pay | Admitting: Family Medicine

## 2011-12-24 MED ORDER — AMLODIPINE BESYLATE 10 MG PO TABS: 10.0000 mg | ORAL_TABLET | Freq: Every day | ORAL | Status: DC

## 2011-12-24 NOTE — Telephone Encounter (Signed)
Refill NORVASC 10 MG Take 1 tablet (10 mg total) by mouth daily #30, last fill 8.1.13 Last ov 8.30.13 f/u DM

## 2011-12-24 NOTE — Telephone Encounter (Signed)
rx sent to pharmacy by e-script  

## 2012-01-02 ENCOUNTER — Telehealth: Payer: Self-pay | Admitting: Family Medicine

## 2012-01-02 NOTE — Telephone Encounter (Signed)
Refill: Lisino-hctz 20--25mg  tab. Take one tablet by mouth every day. Qty 30. Last fill 12-03-11

## 2012-01-03 MED ORDER — LISINOPRIL-HYDROCHLOROTHIAZIDE 20-25 MG PO TABS: 1.0000 | ORAL_TABLET | Freq: Every day | ORAL | Status: DC

## 2012-01-03 NOTE — Telephone Encounter (Signed)
Pts husband called and reqested that this be filled ASAP. They are leaving town today for the weekend.

## 2012-01-03 NOTE — Telephone Encounter (Signed)
rx sent to pharmacy by e-script  

## 2012-03-19 ENCOUNTER — Ambulatory Visit (INDEPENDENT_AMBULATORY_CARE_PROVIDER_SITE_OTHER): Payer: Medicare Other | Admitting: Family Medicine

## 2012-03-19 ENCOUNTER — Encounter: Payer: Self-pay | Admitting: Family Medicine

## 2012-03-19 VITALS — BP 130/68 | HR 87 | Temp 98.3°F | Ht 65.25 in | Wt 201.0 lb

## 2012-03-19 DIAGNOSIS — E119 Type 2 diabetes mellitus without complications: Secondary | ICD-10-CM

## 2012-03-19 NOTE — Assessment & Plan Note (Signed)
Chronic problem.  A1C is 7.7 in office today.  This is up from 7.4.  Stressed the importance of healthy diet and regular activity.  Again encouraged her to schedule eye exam.  No med changes today.  Will continue to follow closely.

## 2012-03-19 NOTE — Progress Notes (Signed)
  Subjective:    Patient ID: Carly Buckley, female    DOB: 23-Jan-1946, 66 y.o.   MRN: [id number]  HPI DM- chronic problem, not checking sugars.  Overdue on eye exam.  Taking Metformin 1000-500-1000mg  and Glimepiride.  No CP, SOB, HAs, visual changes, edema, numbness/tingling.  Has not had flu shot.   Review of Systems For ROS see HPI     Objective:   Physical Exam  Vitals reviewed. Constitutional: She is oriented to person, place, and time. She appears well-developed and well-nourished. No distress.  HENT:  Head: Normocephalic and atraumatic.  Eyes: Conjunctivae normal and EOM are normal. Pupils are equal, round, and reactive to light.  Neck: Normal range of motion. Neck supple. No thyromegaly present.  Cardiovascular: Normal rate, regular rhythm, normal heart sounds and intact distal pulses.   No murmur heard. Pulmonary/Chest: Effort normal and breath sounds normal. No respiratory distress.  Abdominal: Soft. She exhibits no distension. There is no tenderness.  Musculoskeletal: She exhibits no edema.  Lymphadenopathy:    She has no cervical adenopathy.  Neurological: She is alert and oriented to person, place, and time.  Skin: Skin is warm and dry.  Psychiatric: She has a normal mood and affect. Her behavior is normal.          Assessment & Plan:

## 2012-03-19 NOTE — Patient Instructions (Addendum)
Schedule your complete physical in 3 months Try and follow a low carb diet and continue to get regular exercise (walking) SCHEDULE YOUR EYE EXAM!!! Call with any questions or concerns Happy Holidays!

## 2012-03-25 ENCOUNTER — Telehealth: Payer: Self-pay | Admitting: Family Medicine

## 2012-03-25 MED ORDER — METFORMIN HCL 1000 MG PO TABS: 1000.0000 mg | ORAL_TABLET | Freq: Two times a day (BID) | ORAL | Status: DC

## 2012-03-25 NOTE — Telephone Encounter (Signed)
Refill: Metformin 1000 mg tab. Take one tablet by mouth twice daily with food. Qty 60. Last fill 02-18-12

## 2012-03-25 NOTE — Telephone Encounter (Signed)
Rx refill sent//AB/CMA 

## 2012-04-01 ENCOUNTER — Telehealth: Payer: Self-pay | Admitting: Family Medicine

## 2012-04-01 ENCOUNTER — Ambulatory Visit: Payer: Medicare Other | Admitting: Family Medicine

## 2012-04-01 DIAGNOSIS — E119 Type 2 diabetes mellitus without complications: Secondary | ICD-10-CM

## 2012-04-01 MED ORDER — GLIMEPIRIDE 4 MG PO TABS: 4.0000 mg | ORAL_TABLET | Freq: Every day | ORAL | Status: DC

## 2012-04-01 NOTE — Telephone Encounter (Signed)
Refill for amaryl sent to pharmacy

## 2012-04-01 NOTE — Telephone Encounter (Signed)
amaryl 4 mg tablet Qty:30 Last refill: 11.19.13 Take one tablet by mouth everyday before breakfast

## 2012-04-07 ENCOUNTER — Ambulatory Visit: Payer: Medicare Other | Admitting: Family Medicine

## 2012-04-17 ENCOUNTER — Ambulatory Visit (INDEPENDENT_AMBULATORY_CARE_PROVIDER_SITE_OTHER): Payer: Medicare Other | Admitting: Family Medicine

## 2012-04-17 ENCOUNTER — Encounter: Payer: Self-pay | Admitting: Family Medicine

## 2012-04-17 VITALS — BP 140/60 | HR 105 | Temp 98.2°F | Ht 65.25 in | Wt 195.8 lb

## 2012-04-17 DIAGNOSIS — Z01818 Encounter for other preprocedural examination: Secondary | ICD-10-CM

## 2012-04-17 NOTE — Assessment & Plan Note (Signed)
Pt's hx and exam w/out cause to delay surgery.  Pt w/out current palpitations and EKG unchanged but pt told if she has additional sxs, she needs to call so we can arrange a cardiology appt.  Pt expressed understanding and is in agreement w/ plan.

## 2012-04-17 NOTE — Progress Notes (Signed)
Subjective:    Carly Buckley is a 67 y.o. female who presents to the office today for a preoperative consultation at the request of surgeon Dr Lequita Halt who plans on performing R TKR on March 10. This consultation is requested for the specific conditions prompting preoperative evaluation (i.e. because of potential affect on operative risk): DM, HTN. Planned anesthesia: general. The patient has the following known anesthesia issues: none. Patients bleeding risk: use of Ca-channel blockers (see med list) and ASA.  No hx of excessive bleeding. Patient does not have objections to receiving blood products if needed.  The following portions of the patient's history were reviewed and updated as appropriate: allergies, current medications, past medical history, past social history, past surgical history and problem list.  Review of Systems A comprehensive review of systems was negative except for: Cardiovascular: positive for palpitations , has seen Cardiology in the past ([facility])   Objective:    BP 140/60  Pulse 105  Temp 98.2 F (36.8 C) (Oral)  Ht 5' 5.25" (1.657 m)  Wt 195 lb 12.8 oz (88.814 kg)  BMI 32.33 kg/m2  SpO2 97% General appearance: alert, cooperative and no distress Head: Normocephalic, without obvious abnormality, atraumatic Eyes: conjunctivae/corneas clear. PERRL, EOM's intact. Fundi benign. Throat: lips, mucosa, and tongue normal; teeth and gums normal Neck: no adenopathy, supple, symmetrical, trachea midline and thyroid not enlarged, symmetric, no tenderness/mass/nodules Lungs: clear to auscultation bilaterally Heart: regular rate and rhythm, S1, S2 normal, no murmur, click, rub or gallop Abdomen: soft, non-tender; bowel sounds normal; no masses,  no organomegaly Extremities: extremities normal, atraumatic, no cyanosis or edema Pulses: 2+ and symmetric Skin: Skin color, texture, turgor normal. No rashes or lesions  Predictors of intubation difficulty:  Morbid obesity? no  Anatomically abnormal facies? no  Prominent incisors? no  Receding mandible? no  Short, thick neck? no  Neck range of motion: normal  Dentition: dentures  Cardiographics ECG: no change since previous ECG dated 2/13 Echocardiogram: not done  Imaging Chest x-ray: not done   Lab Review  Lab Results  Component Value Date   NA 138 12/14/2011   NA 145 07/23/2011   K 4.4 12/14/2011   K 3.9 07/23/2011   CL 101 12/14/2011   CL 100 07/23/2011   CO2 26 12/14/2011   CO2 27 07/23/2011   BUN 21 12/14/2011   BUN 22 07/23/2011   CREATININE 0.7 12/14/2011   CREATININE 1.0 07/23/2011   GLUCOSE 152* 12/14/2011   GLUCOSE 125* 07/23/2011   CALCIUM 10.2 12/14/2011   CALCIUM 9.5 07/23/2011      Assessment:      67 y.o. female with planned surgery as above.   Known risk factors for perioperative complications: Diabetes mellitus   Difficulty with intubation is not anticipated.  Cardiac Risk Estimation: low  Current medications which may produce withdrawal symptoms if withheld perioperatively: none    Plan:    1. Preoperative workup as follows ECG. 2. Change in medication regimen before surgery: discontinue ASA 14 days before surgery and discontinue Metformin 24 hours before surgery. 3. Prophylaxis for cardiac events with perioperative beta-blockers: should be considered, specific regimen per anesthesia. 4. Invasive hemodynamic monitoring perioperatively: at the discretion of anesthesiologist. 5. Deep vein thrombosis prophylaxis postoperatively:regimen to be chosen by surgical team. 6. Surveillance for postoperative MI with ECG immediately postoperatively and on postoperative days 1 and 2 AND troponin levels 24 hours postoperatively and on day 4 or hospital discharge (whichever comes first): at the discretion of anesthesiologist. 7. Other  measures: Careful attention to perioperative glycemic control (may require sliding scale insulin).

## 2012-04-17 NOTE — Patient Instructions (Addendum)
Follow up as scheduled for your diabetes check At this time there is no reason to delay surgery- I'll fax everything to Ortho You want to STOP your aspirin 2 weeks before surgery and your metformin 24 hrs before surgery Make sure you continue to follow a low carb diet so your sugars are well controlled going into surgery Call with any questions or concerns Happy New Year!  Carly Buckley!)

## 2012-05-20 ENCOUNTER — Other Ambulatory Visit: Payer: Self-pay | Admitting: Orthopedic Surgery

## 2012-05-20 MED ORDER — BUPIVACAINE LIPOSOME 1.3 % IJ SUSP: 20.0000 mL | Freq: Once | INTRAMUSCULAR | Status: DC

## 2012-05-20 MED ORDER — DEXAMETHASONE SODIUM PHOSPHATE 10 MG/ML IJ SOLN: 10.0000 mg | Freq: Once | INTRAMUSCULAR | Status: DC

## 2012-05-20 NOTE — Progress Notes (Signed)
Preoperative surgical orders have been place into the Epic hospital system for Carly Buckley on 05/20/2012, 7:39 AM  by Patrica Duel for surgery on 06/23/2012.  Preop Total Knee orders including Experal, IV Tylenol, and IV Decadron as long as there are no contraindications to the above medications. [name], PA-C

## 2012-05-21 ENCOUNTER — Telehealth: Payer: Self-pay | Admitting: Oncology

## 2012-05-21 ENCOUNTER — Other Ambulatory Visit: Payer: Self-pay | Admitting: Family Medicine

## 2012-05-21 NOTE — Telephone Encounter (Signed)
lvm for pt regarding appt d/t change...mailed pt appt schedule for April.Marland KitchenMarland KitchenDone

## 2012-05-21 NOTE — Telephone Encounter (Signed)
refill 90 DAY SUPPLY Metformin 500MG  tab #90, Take one tablet by mouth every day with Breakfast--last fill 11.4.13

## 2012-05-22 NOTE — Telephone Encounter (Signed)
Fax from [facility]: Pt stays she takes this along with 1000mg  BID - we need refills if so.

## 2012-05-23 NOTE — Telephone Encounter (Signed)
Lm @ (4:58pm) asking the pt to RTC.//AB/CMA

## 2012-05-26 ENCOUNTER — Telehealth: Payer: Self-pay | Admitting: Family Medicine

## 2012-05-26 DIAGNOSIS — E119 Type 2 diabetes mellitus without complications: Secondary | ICD-10-CM

## 2012-05-26 MED ORDER — METFORMIN HCL 500 MG PO TABS: 500.0000 mg | ORAL_TABLET | Freq: Every day | ORAL | Status: DC

## 2012-05-26 NOTE — Telephone Encounter (Signed)
See other encounter.

## 2012-05-26 NOTE — Telephone Encounter (Signed)
refill metformin 1000mg  tablet--PER HAND WRT NOTES FROM PHAMACY--Pts. spouse insists pt should be on 500MG  tablets once daily

## 2012-05-26 NOTE — Telephone Encounter (Signed)
Spoke with patient who states she only takes 500 mg of metformin daily after breakfast. Refill sent to [facility] reflecting that.

## 2012-06-11 ENCOUNTER — Encounter (HOSPITAL_COMMUNITY): Payer: Self-pay | Admitting: Pharmacy Technician

## 2012-06-13 ENCOUNTER — Ambulatory Visit: Payer: Medicare Other | Admitting: Family Medicine

## 2012-06-17 ENCOUNTER — Encounter: Payer: Medicare Other | Admitting: Family Medicine

## 2012-06-18 ENCOUNTER — Encounter (HOSPITAL_COMMUNITY)
Admission: RE | Admit: 2012-06-18 | Discharge: 2012-06-18 | Disposition: A | Discharge status: Home / Self Care | Payer: Medicare Other | Source: Ambulatory Visit | Attending: Orthopedic Surgery | Admitting: Orthopedic Surgery

## 2012-06-18 ENCOUNTER — Encounter (HOSPITAL_COMMUNITY): Payer: Self-pay

## 2012-06-18 ENCOUNTER — Other Ambulatory Visit (HOSPITAL_COMMUNITY): Payer: Self-pay | Admitting: *Deleted

## 2012-06-18 ENCOUNTER — Other Ambulatory Visit: Payer: Self-pay | Admitting: Family Medicine

## 2012-06-18 ENCOUNTER — Ambulatory Visit (HOSPITAL_COMMUNITY)
Admission: RE | Admit: 2012-06-18 | Discharge: 2012-06-18 | Disposition: A | Discharge status: Home / Self Care | Payer: Medicare Other | Source: Ambulatory Visit | Attending: Orthopedic Surgery | Admitting: Orthopedic Surgery

## 2012-06-18 DIAGNOSIS — Z01812 Encounter for preprocedural laboratory examination: Secondary | ICD-10-CM | POA: Insufficient documentation

## 2012-06-18 DIAGNOSIS — Z01818 Encounter for other preprocedural examination: Secondary | ICD-10-CM | POA: Insufficient documentation

## 2012-06-18 HISTORY — DX: Cardiac arrhythmia, unspecified: I49.9

## 2012-06-18 HISTORY — DX: Other specified postprocedural states: Z98.890

## 2012-06-18 HISTORY — DX: Other specified postprocedural states: R11.2

## 2012-06-18 HISTORY — DX: Unspecified osteoarthritis, unspecified site: M19.90

## 2012-06-18 LAB — URINE MICROSCOPIC-ADD ON

## 2012-06-18 LAB — CBC
HCT: 37.8 % (ref 36.0–46.0)
Hemoglobin: 12.3 g/dL (ref 12.0–15.0)
MCH: 30.1 pg (ref 26.0–34.0)
MCHC: 32.5 g/dL (ref 30.0–36.0)
MCV: 92.6 fL (ref 78.0–100.0)
RDW: 14.3 % (ref 11.5–15.5)

## 2012-06-18 LAB — COMPREHENSIVE METABOLIC PANEL
Albumin: 4 g/dL (ref 3.5–5.2)
Alkaline Phosphatase: 78 U/L (ref 39–117)
BUN: 27 mg/dL — ABNORMAL HIGH (ref 6–23)
Calcium: 9.8 mg/dL (ref 8.4–10.5)
Creatinine, Ser: 0.75 mg/dL (ref 0.50–1.10)
GFR calc Af Amer: >90 mL/min (ref >90)
Glucose, Bld: 289 mg/dL — ABNORMAL HIGH (ref 70–99)
Total Protein: 8 g/dL (ref 6.0–8.3)

## 2012-06-18 LAB — URINALYSIS, ROUTINE W REFLEX MICROSCOPIC
Hgb urine dipstick: NEGATIVE
Nitrite: NEGATIVE
Protein, ur: NEGATIVE mg/dL
Urobilinogen, UA: 0.2 mg/dL (ref 0.0–1.0)

## 2012-06-18 LAB — PROTIME-INR
INR: 0.97 (ref 0.00–1.49)
Prothrombin Time: 12.8 seconds (ref 11.6–15.2)

## 2012-06-18 NOTE — Patient Instructions (Signed)
Carly Buckley  06/18/2012                           YOUR PROCEDURE IS SCHEDULED ON:  06/23/12               PLEASE REPORT TO SHORT STAY CENTER AT :  9:45 AM               CALL THIS NUMBER IF ANY PROBLEMS THE DAY OF SURGERY :               [phone]                      REMEMBER:   Do not eat food or drink liquids AFTER MIDNIGHT  May have clear liquids UNTIL 6 HOURS BEFORE SURGERY (6:45 AM)  Clear liquids include soda, tea, black coffee, apple or grape juice, broth.  Take these medicines the morning of surgery with A SIP OF WATER: AMLODIPINE   Do not wear jewelry, make-up   Do not wear lotions, powders, or perfumes.   Do not shave legs or underarms 12 hrs. before surgery (men may shave face)  Do not bring valuables to the hospital.  Contacts, dentures or bridgework may not be worn into surgery.  Leave suitcase in the car. After surgery it may be brought to your room.  For patients admitted to the hospital more than one night, checkout time is 11:00                          The day of discharge.   Patients discharged the day of surgery will not be allowed to drive home                             If going home same day of surgery, must have someone stay with you first                           24 hrs at home and arrange for some one to drive you home from hospital.    Special Instructions:   Please read over the following fact sheets that you were given:               1. MRSA  INFORMATION                      2. [facility] PREPARING FOR SURGERY SHEET               3. INCENTIVE SPIROMETER                                                X_____________________________________________________________________        Failure to follow these instructions may result in cancellation of your surgery

## 2012-06-19 NOTE — Progress Notes (Signed)
Abnormal CMET / UA faxed to Dr.Aluisio 

## 2012-06-22 ENCOUNTER — Other Ambulatory Visit: Payer: Self-pay | Admitting: Orthopedic Surgery

## 2012-06-22 NOTE — H&P (Signed)
Carly Buckley  DOB: 03-19-1946 Married / Language: English / Race: White Female  Date of Admission:  06/23/12  Chief complaint:  Right Knee Pain  History of Present Illness The patient is a 67 year old female who comes in for a preoperative History and Physical. The patient is scheduled for a right total knee arthroplasty to be performed by Dr. Gus Rankin. Aluisio, MD at [facility] on 06/23/2012. The patient is a 67 year old female who presents with knee complaints. The patient reports right knee symptoms including: pain (on the anterior side and can radiate up her leg), swelling and instability which began year(s) ago without any known injury (Patient states that she was doing yard work years ago and pushed her knee against the cart. It has hurt since. It seems to get better with rest. ).The patient feels that the symptoms are worsening. The patient states that both knees hurt equally, right potentially a little bit worse. She has had significant functional limitations due to her knees. She said that they hurt her at all times, including at night. They are limiting what she can and cannot do. They are even affecting her ability to do activities of daily living. She has had injection a few years ago of cortisone, which did not provide any benefit. She is not having any lower extremity weakness or paresthesia. She is starting to get back pain and buttock pain. She is not having groin pain. She is at a stage now where she feels as though something needs to be done with the knees because she cannot tolerate this anymore. She is ready for surgery at this time. They have been treated conservatively in the past for the above stated problem and despite conservative measures, they continue to have progressive pain and severe functional limitations and dysfunction. They have failed non-operative management including home exercise, medications, and injections. It is felt that they would  benefit from undergoing total joint replacement. Risks and benefits of the procedure have been discussed with the patient and they elect to proceed with surgery. There are no active contraindications to surgery such as ongoing infection or rapidly progressive neurological disease.   Problem List Primary osteoarthritis of one knee, right (715.16)   Allergies Morphine Derivatives. Sickness   Family History Diabetes Mellitus. father and brother Drug / Alcohol Addiction. brother Hypertension. mother and brother Cancer. mother and child Kidney disease. father   Social History Illicit drug use. no Living situation. live with spouse Marital status. married Drug/Alcohol Rehab (Currently). no Drug/Alcohol Rehab (Previously). no Exercise. Exercises never Number of flights of stairs before winded. greater than 5 Pain Contract. no Tobacco / smoke exposure. no Tobacco use. former smoker; smoke(d) less than 1/2 pack(s) per day Current work status. retired Alcohol use. never consumed alcohol Children. 4 Advance Directives. Living Will, Healthcare POA Post-Surgical Plans. Patient wants to look into SNF versus going home.   Medication History AmLODIPine Besylate (10MG  Tablet, Oral) Active. Glimepiride (4MG  Tablet, Oral) Active. Lisinopril-Hydrochlorothiazide (20-25MG  Tablet, Oral) Active. MetFORMIN HCl (1000MG  Tablet, Oral) Active. Aspirin Childrens (81MG  Tablet Chewable, Oral) Active.  Past Surgical History Colectomy. partial Colon Polyp Removal - Colonoscopy   Medical Hiatory High blood pressure Colon Cancer Diabetes Mellitus, Type II Diverticulitis Of Colon   Review of Systems General:Not Present- Chills, Fever, Night Sweats, Fatigue, Weight Gain, Weight Loss and Memory Loss. Skin:Not Present- Hives, Itching, Rash, Eczema and Lesions. HEENT:Not Present- Tinnitus, Headache, Double Vision, Visual Loss, Hearing Loss and  Dentures. Respiratory:Not Present- Shortness  of breath with exertion, Shortness of breath at rest, Allergies, Coughing up blood and Chronic Cough. Cardiovascular:Not Present- Chest Pain, Racing/skipping heartbeats, Difficulty Breathing Lying Down, Murmur, Swelling and Palpitations. Gastrointestinal:Not Present- Bloody Stool, Heartburn, Abdominal Pain, Vomiting, Nausea, Constipation, Diarrhea, Difficulty Swallowing, Jaundice and Loss of appetitie. Female Genitourinary:Not Present- Blood in Urine, Urinary frequency, Weak urinary stream, Discharge, Flank Pain, Incontinence, Painful Urination, Urgency, Urinary Retention and Urinating at Night. Musculoskeletal:Present- Joint Swelling, Joint Pain and Back Pain. Not Present- Muscle Weakness, Muscle Pain, Morning Stiffness and Spasms. Neurological:Not Present- Tremor, Dizziness, Blackout spells, Paralysis, Difficulty with balance and Weakness. Psychiatric:Not Present- Insomnia.   Vitals Weight: 195 lb Height: 65 in Weight was reported by patient. Height was reported by patient. Body Surface Area: 2.01 m Body Mass Index: 32.45 kg/m Pulse: 84 (Regular) Resp.: 14 (Unlabored) BP: 148/78 (Sitting, Left Arm, Standard)    Physical Exam The physical exam findings are as follows:   General Mental Status - Alert, cooperative and good historian. General Appearance- pleasant. Not in acute distress. Orientation- Oriented X3. Build & Nutrition- Well nourished and Well developed.   Head and Neck Head- normocephalic, atraumatic . Neck Global Assessment- supple. no bruit auscultated on the right and no bruit auscultated on the left.  upper and lower dentures  Eye Vision- Wears corrective lenses. Pupil- Bilateral- Regular and Round. Motion- Bilateral- EOMI.   Chest and Lung Exam Auscultation: Breath sounds:- clear at anterior chest wall and - clear at posterior chest wall. Adventitious sounds:- No  Adventitious sounds.   Cardiovascular Auscultation:Rhythm- Regular rate and rhythm. Heart Sounds- S1 WNL and S2 WNL. Murmurs & Other Heart Sounds:Auscultation of the heart reveals - No Murmurs.   Abdomen Palpation/Percussion:Tenderness- Abdomen is non-tender to palpation. Rigidity (guarding)- Abdomen is soft. Auscultation:Auscultation of the abdomen reveals - Bowel sounds normal.   Female Genitourinary Not done, not pertinent to present illness  Musculoskeletal On exam a well developed female, alert and oriented in no apparent distress. Both hips have normal range of motion with no discomfort. Her left knee shows no effusion. Range about 0 to 125 with slight crepitus on range of motion. Slight tenderness medial greater than lateral with no instability. The right knee, slight varus, range 5 to 125. Moderate crepitus on range of motion. Tender medial greater than lateral with no instability. Pulses sensation motor intact both lower extremities.  RADIOGRAPHS: AP both knees and lateral show bone on bone in the medial compartment of both knees. She also has significant patellofemoral narrowing in the right worse than left knee.  Assessment & Plan Primary osteoarthritis of one knee, right (715.16)  Note: Plan is for a Right Total Knee Replacement by Dr. Lequita Halt.  Plan is to go home.  PCP - Dr. Beverely Low - Patient has been seen preoperatively and felt to be stable for surgery. The patient was recommended to hold aspirin 2 weeks prior to surgery and hold metformin one day prior to surgery. Surveillance for postoperative MI with EKG immediately postoperatively and on postoperative days 1 and 2 AND troponin levels 24 hours postoperatively and on day 4 or hospital discharge (whichever come first). Prophylaxis for cardiac events with perioperative beta-blockers should be considered, specific regimen per Anesthesia. Invasive hemodynamic monitoring perioperatively at the  discretion of anesthesiologist. DVT prophylaxis postoperatively to be chosen by surgical team. Careful attention to postoperative glycemic control, may require sliding scale insulin.  Signed electronically by Roberts Gaudy, PA-C

## 2012-06-23 ENCOUNTER — Inpatient Hospital Stay (HOSPITAL_COMMUNITY): Admission: RE | Admit: 2012-06-23 | Payer: Medicare Other | Source: Ambulatory Visit | Admitting: Orthopedic Surgery

## 2012-06-23 ENCOUNTER — Encounter (HOSPITAL_COMMUNITY): Admission: RE | Payer: Self-pay | Source: Ambulatory Visit

## 2012-06-23 LAB — TYPE AND SCREEN: ABO/RH(D): A POS

## 2012-06-23 SURGERY — ARTHROPLASTY, KNEE, TOTAL
Anesthesia: Choice | Site: Knee | Laterality: Right

## 2012-07-18 ENCOUNTER — Other Ambulatory Visit: Payer: Self-pay | Admitting: Orthopedic Surgery

## 2012-07-18 ENCOUNTER — Encounter: Payer: Medicare Other | Admitting: Family Medicine

## 2012-07-18 MED ORDER — DEXAMETHASONE SODIUM PHOSPHATE 10 MG/ML IJ SOLN: 10.0000 mg | Freq: Once | INTRAMUSCULAR | Status: DC

## 2012-07-18 MED ORDER — BUPIVACAINE LIPOSOME 1.3 % IJ SUSP: 20.0000 mL | Freq: Once | INTRAMUSCULAR | Status: DC

## 2012-07-18 NOTE — Progress Notes (Signed)
Preoperative surgical orders have been place into the Epic hospital system for Carly Buckley on 07/18/2012, 3:56 PM  by Patrica Duel for surgery on 08/04/2012.  Preop Total Knee orders including Experal, IV Tylenol, and IV Decadron as long as there are no contraindications to the above medications. [name], PA-C

## 2012-07-22 ENCOUNTER — Other Ambulatory Visit: Payer: Self-pay | Admitting: Family Medicine

## 2012-07-24 ENCOUNTER — Telehealth: Payer: Self-pay | Admitting: Oncology

## 2012-07-24 ENCOUNTER — Other Ambulatory Visit (HOSPITAL_BASED_OUTPATIENT_CLINIC_OR_DEPARTMENT_OTHER): Payer: Medicare Other | Admitting: Lab

## 2012-07-24 ENCOUNTER — Ambulatory Visit (HOSPITAL_BASED_OUTPATIENT_CLINIC_OR_DEPARTMENT_OTHER): Payer: Medicare Other | Admitting: Oncology

## 2012-07-24 ENCOUNTER — Encounter (HOSPITAL_COMMUNITY): Payer: Self-pay | Admitting: Pharmacy Technician

## 2012-07-24 VITALS — BP 150/67 | HR 96 | Temp 97.1°F | Resp 20 | Ht 66.5 in | Wt 200.5 lb

## 2012-07-24 DIAGNOSIS — C189 Malignant neoplasm of colon, unspecified: Secondary | ICD-10-CM

## 2012-07-24 LAB — CBC WITH DIFFERENTIAL/PLATELET
BASO%: 0.5 % (ref 0.0–2.0)
Basophils Absolute: 0 10*3/uL (ref 0.0–0.1)
EOS%: 3.8 % (ref 0.0–7.0)
HCT: 36.4 % (ref 34.8–46.6)
HGB: 12 g/dL (ref 11.6–15.9)
LYMPH%: 18.5 % (ref 14.0–49.7)
MCH: 30.4 pg (ref 25.1–34.0)
MCHC: 33 g/dL (ref 31.5–36.0)
MONO#: 0.4 10*3/uL (ref 0.1–0.9)
NEUT%: 71.8 % (ref 38.4–76.8)
Platelets: 171 10*3/uL (ref 145–400)
lymph#: 1.3 10*3/uL (ref 0.9–3.3)

## 2012-07-24 LAB — COMPREHENSIVE METABOLIC PANEL (CC13)
AST: 17 U/L (ref 5–34)
Albumin: 3.8 g/dL (ref 3.5–5.0)
BUN: 17.6 mg/dL (ref 7.0–26.0)
Calcium: 9.8 mg/dL (ref 8.4–10.4)
Chloride: 102 mEq/L (ref 98–107)
Creatinine: 0.8 mg/dL (ref 0.6–1.1)
Glucose: 244 mg/dl — ABNORMAL HIGH (ref 70–99)
Potassium: 4.2 mEq/L (ref 3.5–5.1)

## 2012-07-24 NOTE — Telephone Encounter (Signed)
gve the pt her April 2015 appt calendar °

## 2012-07-24 NOTE — Progress Notes (Signed)
Hematology and Oncology Follow Up Visit  Carly Buckley [id number] [birth year] 67 y.o. 07/24/2012 3:44 PM  CC: Carly Buckley, M.D.  Carly Buckley, M.D.  Carly Buckley, M.D.    Principle Diagnosis: This is a 67 year old gentleman with the diagnosis of colon cancer diagnosed in June 2007, T3 N1 right-sided colon cancer.   Prior Therapy:  1. She had laparoscopically-assisted hemicolectomy done on September 19, 2005.  Had 2 cm tumor with 1/24 lymph nodes involved. 2. The patient received adjuvant FOLFOX chemotherapy utilizing 5-FU, leucovorin, and oxaliplatin therapy.  Therapy concluded in December 2007.  Therapy complicated by mild neutropenia and peripheral neuropathy.  Current therapy: Observation and surveillance.  Interim History:  Carly Buckley presents today for a followup visit.  She is a 67 year old woman with history of stage III colon cancer was diagnosed initially in June 2007, received adjuvant chemotherapy concluded in December 2007, and has had really no signs of cancer recurrence at this time.  She continued cancer surveillance without any evidence to suggest recurrent disease on her last CT scan done in 07/2011.  At that time, she had really no evidence of any tumor recurrence.  She is up to speed on her colonoscopies and continues to be asymptomatic.  She is not reporting any abdominal pain.  Does not report any hematochezia.  Does not report any melena.  Did not report any major changes in her clinical status at this point.  She still has some residual grade one peripheral neuropathy that is unchanged.  Medications: I have reviewed the patient's current medications. Current outpatient prescriptions:amLODipine (NORVASC) 10 MG tablet, Take 10 mg by mouth daily before breakfast., Disp: , Rfl: ;  aspirin 81 MG tablet, Take 81 mg by mouth daily.  , Disp: , Rfl: ;  conjugated estrogens (PREMARIN) vaginal cream, Place vaginally daily., Disp: , Rfl: ;  glimepiride (AMARYL) 4 MG tablet, Take 4  mg by mouth daily before breakfast., Disp: , Rfl:  lisinopril-hydrochlorothiazide (PRINZIDE,ZESTORETIC) 20-25 MG per tablet, Take 1 tablet by mouth daily before breakfast., Disp: , Rfl: ;  metFORMIN (GLUCOPHAGE) 1000 MG tablet, Take 1,000-1,500 mg by mouth 2 (two) times daily with a meal. Takes with 500 mg in the morning to equal 1500 mg in the morning and 1000 at night, Disp: , Rfl:  metFORMIN (GLUCOPHAGE) 500 MG tablet, Take 500 mg by mouth daily with lunch. Takes with 1000 mg in the morning to equal 1500 mg in the morning and 1000 mg at night, Disp: , Rfl: ;  Multiple Vitamin (MULTIVITAMIN) tablet, Take 1 tablet by mouth daily.  , Disp: , Rfl: ;  nystatin-triamcinolone ointment (MYCOLOG), Apply 1 application topically 2 (two) times daily as needed (for rash). Apply to stomach, Disp: , Rfl:  Propylene Glycol (SYSTANE BALANCE) 0.6 % SOLN, Place 1 drop into both eyes daily as needed (for dry eyes)., Disp: , Rfl:   Allergies:  Allergies  Allergen Reactions  . Morphine And Related     Nauseated     Past Medical History, Surgical history, Social history, and Family History were reviewed and updated.  Review of Systems: Constitutional:  Negative for fever, chills, night sweats, anorexia, weight loss, pain. Cardiovascular: no chest pain or dyspnea on exertion Respiratory: negative Neurological: negative Dermatological: negative ENT: negative Skin: Negative. Gastrointestinal: no abdominal pain, change in bowel habits, or black or bloody stools Genito-Urinary: negative Hematological and Lymphatic: negative Breast: negative Musculoskeletal: negative Remaining ROS negative. Physical Exam: Blood pressure 150/67, pulse 96, temperature 97.1 F (36.2  C), temperature source Oral, resp. rate 20, height 5' 6.5" (1.689 m), weight 200 lb 8 oz (90.946 kg), SpO2 96.00%. ECOG: 1 General appearance: alert Head: Normocephalic, without obvious abnormality, atraumatic Neck: no adenopathy, no carotid bruit,  no JVD, supple, symmetrical, trachea midline and thyroid not enlarged, symmetric, no tenderness/mass/nodules Lymph nodes: Cervical, supraclavicular, and axillary nodes normal. Heart:regular rate and rhythm, S1, S2 normal, no murmur, click, rub or gallop Lung:chest clear, no wheezing, rales, normal symmetric air entry Abdomin: soft, non-tender, without masses or organomegaly EXT:no erythema, induration, or nodules   Lab Results: Lab Results  Component Value Date   WBC 7.3 07/24/2012   HGB 12.0 07/24/2012   HCT 36.4 07/24/2012   MCV 91.9 07/24/2012   PLT 171 07/24/2012     Chemistry      Component Value Date/Time   NA 135 06/18/2012 1300   NA 145 07/23/2011 1101   K 4.3 06/18/2012 1300   K 3.9 07/23/2011 1101   CL 97 06/18/2012 1300   CL 100 07/23/2011 1101   CO2 24 06/18/2012 1300   CO2 27 07/23/2011 1101   BUN 27* 06/18/2012 1300   BUN 22 07/23/2011 1101   CREATININE 0.75 06/18/2012 1300   CREATININE 1.0 07/23/2011 1101      Component Value Date/Time   CALCIUM 9.8 06/18/2012 1300   CALCIUM 9.5 07/23/2011 1101   ALKPHOS 78 06/18/2012 1300   ALKPHOS 71 07/23/2011 1101   AST 21 06/18/2012 1300   AST 18 07/23/2011 1101   ALT 29 06/18/2012 1300   BILITOT 0.2* 06/18/2012 1300   BILITOT 0.50 07/23/2011 1101     Impression and Plan:  This is a pleasant 67 year old female with the following issues. 1. Stage III colon cancer, T3 N1 disease.  She is over 5 years out from her surgery and the conclusion of her chemotherapy.  No evidence to suggest recurrent disease.  Her CT scan continue to show no relapse at this time. The plan is to ask her to come back in 12 months' time and repeat imaging studies as needed.  Also repeat liver function tests and CEA.   2. Colonoscopic screening.  Last time was done 2011 and followed up with Dr. Ewing Schlein regarding that. 3. Peripheral neuropathy.  Seems to have resolved at this time and very faint at this time.  [facility], MD 4/10/20143:44 PM

## 2012-07-25 ENCOUNTER — Other Ambulatory Visit: Payer: Medicare Other | Admitting: Lab

## 2012-07-25 ENCOUNTER — Ambulatory Visit: Payer: Medicare Other | Admitting: Oncology

## 2012-07-25 NOTE — Progress Notes (Signed)
Chest x ray 3/14 EKG 1/14, CBC with diff and OV Dr Clelia Croft 07/24/12  EPIC

## 2012-07-25 NOTE — Patient Instructions (Addendum)
20 [name]  07/25/2012   Your procedure is scheduled on:  08/04/12  MONDAY  Report to [facility] Stay Center at    0630   AM.  Call this number if you have problems the morning of surgery: [phone]       Remember: DO NOT TAKE ANY BLOOD SUGAR MEDICATION MORNING OF SURGERY  Do not eat food  Or drink :After Midnight.SUNDAY NIGHT   Take these medicines the morning of surgery with A SIP OF WATER: Amlodipine   .  Contacts, dentures or partial plates can not be worn to surgery  Leave suitcase in the car. After surgery it may be brought to your room.  For patients admitted to the hospital, checkout time is 11:00 AM day of  discharge.             SPECIAL INSTRUCTIONS- SEE [facility] PREPARING FOR SURGERY INSTRUCTION SHEET-     DO NOT WEAR JEWELRY, LOTIONS, POWDERS, OR PERFUMES.  WOMEN-- DO NOT SHAVE LEGS OR UNDERARMS FOR 12 HOURS BEFORE SHOWERS. MEN MAY SHAVE FACE.  Patients discharged the day of surgery will not be allowed to drive home. IF going home the day of surgery, you must have a driver and someone to stay with you for the first 24 hours  Name and phone number of your driver:     admission                                                                   Please read over the following fact sheets that you were given: MRSA Information, Incentive Spirometry Sheet, Blood Transfusion Sheet  Information                                                                                   Carly Buckley  PST [phone]  [id number]                 FAILURE TO FOLLOW THESE INSTRUCTIONS MAY RESULT IN  CANCELLATION   OF YOUR SURGERY                                                  Patient Signature _____________________________

## 2012-07-28 ENCOUNTER — Encounter (HOSPITAL_COMMUNITY): Payer: Self-pay

## 2012-07-28 ENCOUNTER — Ambulatory Visit (HOSPITAL_COMMUNITY)
Admission: RE | Admit: 2012-07-28 | Discharge: 2012-07-28 | Disposition: A | Discharge status: Home / Self Care | Payer: Medicare Other | Source: Ambulatory Visit | Attending: Orthopedic Surgery | Admitting: Orthopedic Surgery

## 2012-07-28 ENCOUNTER — Encounter (HOSPITAL_COMMUNITY)
Admission: RE | Admit: 2012-07-28 | Discharge: 2012-07-28 | Disposition: A | Discharge status: Home / Self Care | Payer: Medicare Other | Source: Ambulatory Visit | Attending: Orthopedic Surgery | Admitting: Orthopedic Surgery

## 2012-07-28 DIAGNOSIS — M1710 Unilateral primary osteoarthritis, unspecified knee: Secondary | ICD-10-CM | POA: Insufficient documentation

## 2012-07-28 DIAGNOSIS — Z87891 Personal history of nicotine dependence: Secondary | ICD-10-CM | POA: Insufficient documentation

## 2012-07-28 DIAGNOSIS — R05 Cough: Secondary | ICD-10-CM | POA: Insufficient documentation

## 2012-07-28 DIAGNOSIS — E119 Type 2 diabetes mellitus without complications: Secondary | ICD-10-CM | POA: Insufficient documentation

## 2012-07-28 DIAGNOSIS — J3489 Other specified disorders of nose and nasal sinuses: Secondary | ICD-10-CM | POA: Insufficient documentation

## 2012-07-28 DIAGNOSIS — Z01812 Encounter for preprocedural laboratory examination: Secondary | ICD-10-CM | POA: Insufficient documentation

## 2012-07-28 DIAGNOSIS — R059 Cough, unspecified: Secondary | ICD-10-CM | POA: Insufficient documentation

## 2012-07-28 LAB — URINALYSIS, ROUTINE W REFLEX MICROSCOPIC
Bilirubin Urine: NEGATIVE
Ketones, ur: NEGATIVE mg/dL
Nitrite: NEGATIVE
Protein, ur: NEGATIVE mg/dL
Urobilinogen, UA: 0.2 mg/dL (ref 0.0–1.0)
pH: 5 (ref 5.0–8.0)

## 2012-07-28 LAB — CBC
HCT: 39.6 % (ref 36.0–46.0)
MCH: 30.4 pg (ref 26.0–34.0)
MCHC: 33.1 g/dL (ref 30.0–36.0)
MCV: 91.9 fL (ref 78.0–100.0)
Platelets: 190 10*3/uL (ref 150–400)
RDW: 13.9 % (ref 11.5–15.5)
WBC: 6 10*3/uL (ref 4.0–10.5)

## 2012-07-28 LAB — PROTIME-INR
INR: 0.95 (ref 0.00–1.49)
Prothrombin Time: 12.6 seconds (ref 11.6–15.2)

## 2012-07-28 LAB — COMPREHENSIVE METABOLIC PANEL
AST: 22 U/L (ref 0–37)
Albumin: 4.4 g/dL (ref 3.5–5.2)
BUN: 17 mg/dL (ref 6–23)
Chloride: 99 mEq/L (ref 96–112)
Creatinine, Ser: 0.58 mg/dL (ref 0.50–1.10)
Potassium: 4.4 mEq/L (ref 3.5–5.1)
Total Bilirubin: 0.3 mg/dL (ref 0.3–1.2)
Total Protein: 8.2 g/dL (ref 6.0–8.3)

## 2012-07-28 LAB — URINE MICROSCOPIC-ADD ON

## 2012-07-28 LAB — APTT: aPTT: 26 seconds (ref 24–37)

## 2012-07-28 NOTE — Progress Notes (Signed)
At PST visit- states has had a productive cough sine last week- does not know what color as states swallows phlegm, no fever. Congestion noted upper chest with coughing noted at visit-  Chest x ray repeated today. Instructed to have this evaluated by PCP pre op as she could possibly be rescheduled if is sick. Verbalized understanding- also to clarify with PCP about holding her Metformin pre op as is stated on clearance note from last scheduled surgery note.  Faxed u/a with micro report to Dr Lequita Halt for review

## 2012-07-29 ENCOUNTER — Encounter: Payer: Self-pay | Admitting: Family Medicine

## 2012-07-29 ENCOUNTER — Ambulatory Visit (INDEPENDENT_AMBULATORY_CARE_PROVIDER_SITE_OTHER): Payer: Medicare Other | Admitting: Family Medicine

## 2012-07-29 VITALS — BP 140/68 | HR 97 | Temp 98.8°F | Wt 198.6 lb

## 2012-07-29 DIAGNOSIS — J019 Acute sinusitis, unspecified: Secondary | ICD-10-CM

## 2012-07-29 LAB — URINE CULTURE

## 2012-07-29 MED ORDER — CEFUROXIME AXETIL 500 MG PO TABS: 500.0000 mg | ORAL_TABLET | Freq: Two times a day (BID) | ORAL | Status: DC

## 2012-07-29 NOTE — Progress Notes (Signed)
  Subjective:     Carly Buckley is a 67 y.o. female who presents for evaluation of symptoms of a URI. Symptoms include congestion, cough described as productive, no  fever and post nasal drip. Onset of symptoms was 1 week ago, and has been gradually worsening since that time. Treatment to date: none.-----pt is having knee replacement Monday.  The following portions of the patient's history were reviewed and updated as appropriate: allergies, current medications, past family history, past medical history, past social history, past surgical history and problem list.  Review of Systems Pertinent items are noted in HPI.   Objective:    BP 140/68  Pulse 97  Temp(Src) 98.8 F (37.1 C) (Oral)  Wt 198 lb 9.6 oz (90.084 kg)  BMI 31.58 kg/m2  SpO2 97% General appearance: alert, cooperative, appears stated age and no distress Ears: normal TM's and external ear canals both ears Nose: green discharge, moderate congestion, turbinates red, swollen, no sinus tenderness Throat: lips, mucosa, and tongue normal; teeth and gums normal Neck: mild anterior cervical adenopathy, supple, symmetrical, trachea midline and thyroid not enlarged, symmetric, no tenderness/mass/nodules Lungs: clear to auscultation bilaterally Heart: S1, S2 normal   Assessment:    sinusitis   Plan:    Suggested symptomatic OTC remedies. Ceftin per orders. Nasal steroids per orders. Follow up as needed.

## 2012-07-29 NOTE — Patient Instructions (Addendum)

## 2012-07-30 NOTE — Progress Notes (Signed)
Noted Negative Urine Culture from 07/28/12

## 2012-08-02 ENCOUNTER — Other Ambulatory Visit: Payer: Self-pay | Admitting: Orthopedic Surgery

## 2012-08-02 NOTE — H&P (Signed)
Carly Buckley  DOB: 1945-10-14 Married / Language: English / Race: White Female  Date of Admission:  08/04/2012  Chief Complaint:  Right Knee Pain  History of Present Illness The patient is a 67 year old female who comes in for a preoperative History and Physical. The patient is scheduled for a right total knee arthroplasty to be performed by Dr. Gus Rankin. Aluisio, MD at [facility] on 08/04/2012. The patient is a 67 year old female who presents with knee complaints. The patient reports right knee symptoms including: pain (on the anterior side and can radiate up her leg), swelling and instability which began year(s) ago without any known injury (Patient states that she was doing yard work years ago and pushed her knee against the cart. It has hurt since. It seems to get better with rest. ).The patient feels that the symptoms are worsening. The patient states that both knees hurt equally, right potentially a little bit worse. She has had significant functional limitations due to her knees. She said that they hurt her at all times, including at night. They are limiting what she can and cannot do. They are even affecting her ability to do activities of daily living. She has had injection a few years ago of cortisone, which did not provide any benefit. She is not having any lower extremity weakness or paresthesia. She is starting to get back pain and buttock pain. She is not having groin pain. She is at a stage now where she feels as though something needs to be done with the knees because she cannot tolerate this anymore. They have been treated conservatively in the past for the above stated problem and despite conservative measures, they continue to have progressive pain and severe functional limitations and dysfunction. They have failed non-operative management including home exercise, medications, and injections. It is felt that they would benefit from undergoing total joint  replacement. Risks and benefits of the procedure have been discussed with the patient and they elect to proceed with surgery. There are no active contraindications to surgery such as ongoing infection or rapidly progressive neurological disease. She was originally scheduled for 06/23/2012 but due to inclement weather and ice, she was unable to go through the surgery at that time and now has been rescheduled for the right knee procedure.   Problem List Primary osteoarthritis of one knee, right (715.16)   Allergies Morphine Derivatives. Sickness   Family History Kidney disease. father Cancer. mother and child Hypertension. mother and brother Diabetes Mellitus. father and brother Drug / Alcohol Addiction. brother   Social History Alcohol use. never consumed alcohol Children. 4 Advance Directives. Living Will, Healthcare POA Post-Surgical Plans. Patient wants to look into SNF versus going home. Drug/Alcohol Rehab (Previously). no Exercise. Exercises never Number of flights of stairs before winded. greater than 5 Drug/Alcohol Rehab (Currently). no Illicit drug use. no Living situation. live with spouse Marital status. married Current work status. retired Tobacco use. former smoker; smoke(d) less than 1/2 pack(s) per day Pain Contract. no Tobacco / smoke exposure. no   Past Surgical History Colectomy. partial Colon Polyp Removal - Colonoscopy   Medical History High blood pressure Colon Cancer Diabetes Mellitus, Type II Diverticulitis Of Colon   Review of Systems General:Not Present- Chills, Fever, Night Sweats, Fatigue, Weight Gain, Weight Loss and Memory Loss. Skin:Not Present- Hives, Itching, Rash, Eczema and Lesions. HEENT:Not Present- Tinnitus, Headache, Double Vision, Visual Loss, Hearing Loss and Dentures. Respiratory:Not Present- Shortness of breath with exertion, Shortness of  breath at rest, Allergies, Coughing up blood and  Chronic Cough. Cardiovascular:Not Present- Chest Pain, Racing/skipping heartbeats, Difficulty Breathing Lying Down, Murmur, Swelling and Palpitations. Gastrointestinal:Not Present- Bloody Stool, Heartburn, Abdominal Pain, Vomiting, Nausea, Constipation, Diarrhea, Difficulty Swallowing, Jaundice and Loss of appetitie. Female Genitourinary:Not Present- Blood in Urine, Urinary frequency, Weak urinary stream, Discharge, Flank Pain, Incontinence, Painful Urination, Urgency, Urinary Retention and Urinating at Night. Musculoskeletal:Present- Joint Swelling, Joint Pain and Back Pain. Not Present- Muscle Weakness, Muscle Pain, Morning Stiffness and Spasms. Neurological:Not Present- Tremor, Dizziness, Blackout spells, Paralysis, Difficulty with balance and Weakness. Psychiatric:Not Present- Insomnia.   Vitals Weight: 195 lb Height: 65 in Weight was reported by patient. Height was reported by patient. Body Surface Area: 2.01 m Body Mass Index: 32.45 kg/m Pulse: 96 (Regular) Resp.: 16 (Unlabored) BP: 158/68 (Sitting, Left Arm, Standard)    Physical Exam The physical exam findings are as follows:   General Mental Status - Alert, cooperative and good historian. General Appearance- pleasant. Not in acute distress. Orientation- Oriented X3. Build & Nutrition- Well nourished and Well developed.   Head and Neck Head- normocephalic, atraumatic . Neck Global Assessment- supple. no bruit auscultated on the right and no bruit auscultated on the left.   Note: upper and lower dentures  Eye Vision- Wears corrective lenses. Pupil- Bilateral- Regular and Round. Motion- Bilateral- EOMI.   Chest and Lung Exam Auscultation: Breath sounds:- clear at anterior chest wall and - clear at posterior chest wall. Adventitious sounds:- No Adventitious sounds.   Cardiovascular Auscultation:Rhythm- Regular rate and rhythm. Heart Sounds- S1 WNL and S2 WNL. Murmurs &  Other Heart Sounds: Murmur 1:Location- Aortic Area. Timing- Mid-diastolic. Grade- II/VI. Character- Low pitched.   Abdomen Palpation/Percussion:Tenderness- Abdomen is non-tender to palpation. Rigidity (guarding)- Abdomen is soft. Auscultation:Auscultation of the abdomen reveals - Bowel sounds normal.   Female Genitourinary   Note: Not done, not pertinent to present illness  Musculoskeletal On exam a well developed female, alert and oriented in no apparent distress. Both hips have normal range of motion with no discomfort. Her left knee shows no effusion. Range about 0 to 125 with slight crepitus on range of motion. Slight tenderness medial greater than lateral with no instability. The right knee, slight varus, range 5 to 125. Moderate crepitus on range of motion. Tender medial greater than lateral with no instability. Pulses sensation motor intact both lower extremities.  RADIOGRAPHS: AP both knees and lateral show bone on bone in the medial compartment of both knees. She also has significant patellofemoral narrowing in the right worse than left knee.  Assessment & Plan Primary osteoarthritis of one knee, right (715.16)  Note: Plan is for a Right Total Knee Replacement by Dr. Lequita Halt.  Plan is to go home.  PCP - Dr. Beverely Low - Patient has been seen preoperatively and felt to be stable for surgery. The patient was recommended to hold aspirin 2 weeks prior to surgery and hold metformin one day prior to surgery. Surveillance for postoperative MI with EKG immediately postoperatively and on postoperative days 1 and 2 AND troponin levels 24 hours postoperatively and on day 4 or hospital discharge (whichever come first). Prophylaxis for cardiac events with perioperative beta-blockers should be considered, specific regimen per Anesthesia. Invasive hemodynamic monitoring perioperatively at the discretion of anesthesiologit. DVT prophylaxis postoperatively to be chosen by  surgical team. Careful attention to postoperative glycemic control, may require sliding scale insulin.  Signed electronically by Roberts Gaudy, PA-C

## 2012-08-04 ENCOUNTER — Encounter (HOSPITAL_COMMUNITY): Payer: Self-pay | Admitting: *Deleted

## 2012-08-04 ENCOUNTER — Encounter (HOSPITAL_COMMUNITY)
Admission: RE | Disposition: A | Discharge status: Home Health | Payer: Self-pay | Source: Ambulatory Visit | Attending: Orthopedic Surgery

## 2012-08-04 ENCOUNTER — Inpatient Hospital Stay (HOSPITAL_COMMUNITY)
Admission: RE | Admit: 2012-08-04 | Discharge: 2012-08-07 | DRG: 470 | Disposition: A | Discharge status: Home Health | Payer: Medicare Other | Source: Ambulatory Visit | Attending: Orthopedic Surgery | Admitting: Orthopedic Surgery

## 2012-08-04 ENCOUNTER — Inpatient Hospital Stay (HOSPITAL_COMMUNITY): Payer: Medicare Other | Admitting: Anesthesiology

## 2012-08-04 ENCOUNTER — Encounter (HOSPITAL_COMMUNITY): Payer: Self-pay | Admitting: Anesthesiology

## 2012-08-04 DIAGNOSIS — M179 Osteoarthritis of knee, unspecified: Secondary | ICD-10-CM | POA: Diagnosis present

## 2012-08-04 DIAGNOSIS — E1149 Type 2 diabetes mellitus with other diabetic neurological complication: Secondary | ICD-10-CM | POA: Diagnosis present

## 2012-08-04 DIAGNOSIS — E1142 Type 2 diabetes mellitus with diabetic polyneuropathy: Secondary | ICD-10-CM | POA: Diagnosis present

## 2012-08-04 DIAGNOSIS — D62 Acute posthemorrhagic anemia: Secondary | ICD-10-CM | POA: Diagnosis not present

## 2012-08-04 DIAGNOSIS — E871 Hypo-osmolality and hyponatremia: Secondary | ICD-10-CM | POA: Diagnosis not present

## 2012-08-04 DIAGNOSIS — Z85038 Personal history of other malignant neoplasm of large intestine: Secondary | ICD-10-CM

## 2012-08-04 DIAGNOSIS — I10 Essential (primary) hypertension: Secondary | ICD-10-CM | POA: Diagnosis present

## 2012-08-04 DIAGNOSIS — M1710 Unilateral primary osteoarthritis, unspecified knee: Principal | ICD-10-CM | POA: Diagnosis present

## 2012-08-04 HISTORY — DX: Adverse effect of unspecified anesthetic, initial encounter: T41.45XA

## 2012-08-04 HISTORY — DX: Other complications of anesthesia, initial encounter: T88.59XA

## 2012-08-04 HISTORY — PX: STERIOD INJECTION: SHX5046

## 2012-08-04 HISTORY — PX: TOTAL KNEE ARTHROPLASTY: SHX125

## 2012-08-04 LAB — TYPE AND SCREEN: Antibody Screen: NEGATIVE

## 2012-08-04 LAB — GLUCOSE, CAPILLARY: Glucose-Capillary: 194 mg/dL — ABNORMAL HIGH (ref 70–99)

## 2012-08-04 SURGERY — ARTHROPLASTY, KNEE, TOTAL
Anesthesia: Spinal | Site: Knee | Laterality: Right | Wound class: Clean

## 2012-08-04 MED ORDER — ONDANSETRON HCL 4 MG/2ML IJ SOLN
4.0000 mg | Freq: Four times a day (QID) | INTRAMUSCULAR | Status: DC | PRN
  Administered 2012-08-04 – 2012-08-05 (×2): 4 mg via INTRAVENOUS
  Filled 2012-08-04 (×2): qty 2

## 2012-08-04 MED ORDER — CEFAZOLIN SODIUM-DEXTROSE 2-3 GM-% IV SOLR
2.0000 g | INTRAVENOUS | Status: AC
  Administered 2012-08-04: 2 g via INTRAVENOUS

## 2012-08-04 MED ORDER — DOCUSATE SODIUM 100 MG PO CAPS
100.0000 mg | ORAL_CAPSULE | Freq: Two times a day (BID) | ORAL | Status: DC
  Administered 2012-08-04 – 2012-08-07 (×6): 100 mg via ORAL

## 2012-08-04 MED ORDER — ACETAMINOPHEN 10 MG/ML IV SOLN
1000.0000 mg | Freq: Once | INTRAVENOUS | Status: AC
  Administered 2012-08-04: 1000 mg via INTRAVENOUS

## 2012-08-04 MED ORDER — MIDAZOLAM HCL 5 MG/5ML IJ SOLN
INTRAMUSCULAR | Status: DC | PRN
  Administered 2012-08-04: 1 mg via INTRAVENOUS

## 2012-08-04 MED ORDER — METFORMIN HCL 500 MG PO TABS
500.0000 mg | ORAL_TABLET | Freq: Every day | ORAL | Status: DC
  Administered 2012-08-05 – 2012-08-06 (×2): 500 mg via ORAL
  Filled 2012-08-04 (×3): qty 1

## 2012-08-04 MED ORDER — BUPIVACAINE IN DEXTROSE 0.75-8.25 % IT SOLN
INTRATHECAL | Status: DC | PRN
  Administered 2012-08-04: 2 mL via INTRATHECAL

## 2012-08-04 MED ORDER — LACTATED RINGERS IV SOLN
INTRAVENOUS | Status: DC | PRN
  Administered 2012-08-04 (×2): via INTRAVENOUS

## 2012-08-04 MED ORDER — SODIUM CHLORIDE 0.9 % IJ SOLN
INTRAMUSCULAR | Status: DC | PRN
  Administered 2012-08-04: 10:00:00

## 2012-08-04 MED ORDER — ONDANSETRON HCL 4 MG/2ML IJ SOLN
INTRAMUSCULAR | Status: DC | PRN
  Administered 2012-08-04 (×2): 2 mg via INTRAVENOUS

## 2012-08-04 MED ORDER — ACETAMINOPHEN 10 MG/ML IV SOLN: 1000.0000 mg | Freq: Once | INTRAVENOUS | Status: DC | PRN

## 2012-08-04 MED ORDER — PHENOL 1.4 % MT LIQD
1.0000 | OROMUCOSAL | Status: DC | PRN
Start: 2012-08-04 — End: 2012-08-07

## 2012-08-04 MED ORDER — DIPHENHYDRAMINE HCL 12.5 MG/5ML PO ELIX: 12.5000 mg | ORAL_SOLUTION | ORAL | Status: DC | PRN

## 2012-08-04 MED ORDER — ONDANSETRON HCL 4 MG PO TABS: 4.0000 mg | ORAL_TABLET | Freq: Four times a day (QID) | ORAL | Status: DC | PRN

## 2012-08-04 MED ORDER — MEPERIDINE HCL 50 MG/ML IJ SOLN: 6.2500 mg | INTRAMUSCULAR | Status: DC | PRN

## 2012-08-04 MED ORDER — POLYETHYLENE GLYCOL 3350 17 G PO PACK
17.0000 g | PACK | Freq: Every day | ORAL | Status: DC | PRN
  Administered 2012-08-06: 17 g via ORAL

## 2012-08-04 MED ORDER — KETAMINE HCL 50 MG/ML IJ SOLN: INTRAMUSCULAR | Status: DC | PRN

## 2012-08-04 MED ORDER — METHOCARBAMOL 500 MG PO TABS
500.0000 mg | ORAL_TABLET | Freq: Four times a day (QID) | ORAL | Status: DC | PRN
  Administered 2012-08-04 – 2012-08-07 (×7): 500 mg via ORAL
  Filled 2012-08-04 (×7): qty 1

## 2012-08-04 MED ORDER — POLYVINYL ALCOHOL 1.4 % OP SOLN
1.0000 [drp] | Freq: Every day | OPHTHALMIC | Status: DC | PRN
  Filled 2012-08-04: qty 15

## 2012-08-04 MED ORDER — PROPOFOL 10 MG/ML IV EMUL
INTRAVENOUS | Status: DC | PRN
  Administered 2012-08-04: 50 ug/kg/min via INTRAVENOUS

## 2012-08-04 MED ORDER — BUPIVACAINE LIPOSOME 1.3 % IJ SUSP
20.0000 mL | Freq: Once | INTRAMUSCULAR | Status: DC
  Filled 2012-08-04: qty 20

## 2012-08-04 MED ORDER — PROMETHAZINE HCL 12.5 MG RE SUPP
6.2500 mg | Freq: Four times a day (QID) | RECTAL | Status: DC | PRN
  Filled 2012-08-04: qty 1

## 2012-08-04 MED ORDER — SODIUM CHLORIDE 0.9 % IV SOLN: INTRAVENOUS | Status: DC

## 2012-08-04 MED ORDER — PROMETHAZINE HCL 25 MG/ML IJ SOLN: 6.2500 mg | INTRAMUSCULAR | Status: DC | PRN

## 2012-08-04 MED ORDER — ACETAMINOPHEN 650 MG RE SUPP: 650.0000 mg | Freq: Four times a day (QID) | RECTAL | Status: DC | PRN

## 2012-08-04 MED ORDER — METHYLPREDNISOLONE ACETATE 80 MG/ML IJ SUSP
INTRAMUSCULAR | Status: DC | PRN
  Administered 2012-08-04: 80 mg

## 2012-08-04 MED ORDER — ACETAMINOPHEN 10 MG/ML IV SOLN
1000.0000 mg | Freq: Four times a day (QID) | INTRAVENOUS | Status: AC
  Administered 2012-08-04 – 2012-08-05 (×4): 1000 mg via INTRAVENOUS
  Filled 2012-08-04 (×6): qty 100

## 2012-08-04 MED ORDER — MENTHOL 3 MG MT LOZG: 1.0000 | LOZENGE | OROMUCOSAL | Status: DC | PRN

## 2012-08-04 MED ORDER — METFORMIN HCL 500 MG PO TABS
1000.0000 mg | ORAL_TABLET | Freq: Two times a day (BID) | ORAL | Status: DC
  Administered 2012-08-04 – 2012-08-07 (×6): 1000 mg via ORAL
  Filled 2012-08-04 (×8): qty 2

## 2012-08-04 MED ORDER — CEFAZOLIN SODIUM 1-5 GM-% IV SOLN
1.0000 g | Freq: Four times a day (QID) | INTRAVENOUS | Status: AC
  Administered 2012-08-04 (×2): 1 g via INTRAVENOUS
  Filled 2012-08-04 (×2): qty 50

## 2012-08-04 MED ORDER — KETAMINE HCL 10 MG/ML IJ SOLN
INTRAMUSCULAR | Status: DC | PRN
  Administered 2012-08-04 (×4): 5 mg via INTRAVENOUS

## 2012-08-04 MED ORDER — METHOCARBAMOL 100 MG/ML IJ SOLN
500.0000 mg | Freq: Four times a day (QID) | INTRAVENOUS | Status: DC | PRN
  Filled 2012-08-04: qty 5

## 2012-08-04 MED ORDER — SODIUM CHLORIDE 0.45 % IV SOLN
INTRAVENOUS | Status: DC
  Administered 2012-08-04: 17:00:00 via INTRAVENOUS

## 2012-08-04 MED ORDER — TRAMADOL HCL 50 MG PO TABS
50.0000 mg | ORAL_TABLET | Freq: Four times a day (QID) | ORAL | Status: DC | PRN
  Administered 2012-08-05 – 2012-08-06 (×3): 100 mg via ORAL
  Filled 2012-08-04 (×3): qty 2

## 2012-08-04 MED ORDER — RIVAROXABAN 10 MG PO TABS
10.0000 mg | ORAL_TABLET | Freq: Every day | ORAL | Status: DC
  Administered 2012-08-05 – 2012-08-07 (×3): 10 mg via ORAL
  Filled 2012-08-04 (×4): qty 1

## 2012-08-04 MED ORDER — METOCLOPRAMIDE HCL 10 MG PO TABS: 5.0000 mg | ORAL_TABLET | Freq: Three times a day (TID) | ORAL | Status: DC | PRN

## 2012-08-04 MED ORDER — SODIUM CHLORIDE 0.9 % IR SOLN
Status: DC | PRN
  Administered 2012-08-04: 1000 mL

## 2012-08-04 MED ORDER — NYSTATIN-TRIAMCINOLONE 100000-0.1 UNIT/GM-% EX OINT: 1.0000 "application " | TOPICAL_OINTMENT | Freq: Two times a day (BID) | CUTANEOUS | Status: DC | PRN

## 2012-08-04 MED ORDER — KETOROLAC TROMETHAMINE 30 MG/ML IJ SOLN: 30.0000 mg | Freq: Once | INTRAMUSCULAR | Status: DC | PRN

## 2012-08-04 MED ORDER — OXYCODONE HCL 5 MG PO TABS
5.0000 mg | ORAL_TABLET | ORAL | Status: DC | PRN
  Administered 2012-08-04: 5 mg via ORAL
  Administered 2012-08-05 (×2): 10 mg via ORAL
  Filled 2012-08-04 (×2): qty 2
  Filled 2012-08-04: qty 1

## 2012-08-04 MED ORDER — FLEET ENEMA 7-19 GM/118ML RE ENEM: 1.0000 | ENEMA | Freq: Once | RECTAL | Status: AC | PRN

## 2012-08-04 MED ORDER — METOCLOPRAMIDE HCL 5 MG/ML IJ SOLN
5.0000 mg | Freq: Three times a day (TID) | INTRAMUSCULAR | Status: DC | PRN
  Administered 2012-08-05: 10 mg via INTRAVENOUS
  Filled 2012-08-04: qty 2

## 2012-08-04 MED ORDER — PROMETHAZINE HCL 25 MG/ML IJ SOLN
6.2500 mg | Freq: Four times a day (QID) | INTRAMUSCULAR | Status: DC | PRN
  Administered 2012-08-04 – 2012-08-05 (×2): 6.25 mg via INTRAVENOUS
  Filled 2012-08-04 (×2): qty 1

## 2012-08-04 MED ORDER — INSULIN ASPART 100 UNIT/ML ~~LOC~~ SOLN
0.0000 [IU] | Freq: Three times a day (TID) | SUBCUTANEOUS | Status: DC
  Administered 2012-08-04: 8 [IU] via SUBCUTANEOUS
  Administered 2012-08-05 (×2): 5 [IU] via SUBCUTANEOUS
  Administered 2012-08-06 – 2012-08-07 (×4): 3 [IU] via SUBCUTANEOUS

## 2012-08-04 MED ORDER — HYDROMORPHONE HCL PF 1 MG/ML IJ SOLN
0.2500 mg | INTRAMUSCULAR | Status: DC | PRN
  Administered 2012-08-04 (×4): 0.5 mg via INTRAVENOUS

## 2012-08-04 MED ORDER — BISACODYL 10 MG RE SUPP: 10.0000 mg | Freq: Every day | RECTAL | Status: DC | PRN

## 2012-08-04 MED ORDER — PROPYLENE GLYCOL 0.6 % OP SOLN: 1.0000 [drp] | Freq: Every day | OPHTHALMIC | Status: DC | PRN

## 2012-08-04 MED ORDER — EPHEDRINE SULFATE 50 MG/ML IJ SOLN
INTRAMUSCULAR | Status: DC | PRN
  Administered 2012-08-04 (×3): 5 mg via INTRAVENOUS

## 2012-08-04 MED ORDER — AMLODIPINE BESYLATE 10 MG PO TABS
10.0000 mg | ORAL_TABLET | Freq: Every day | ORAL | Status: DC
  Administered 2012-08-05 – 2012-08-07 (×3): 10 mg via ORAL
  Filled 2012-08-04 (×3): qty 1

## 2012-08-04 MED ORDER — HYDROMORPHONE HCL PF 1 MG/ML IJ SOLN
0.5000 mg | INTRAMUSCULAR | Status: DC | PRN
  Administered 2012-08-05 – 2012-08-06 (×4): 1 mg via INTRAVENOUS
  Filled 2012-08-04 (×4): qty 1

## 2012-08-04 MED ORDER — DIAZEPAM 5 MG/ML IJ SOLN: 5.0000 mg | INTRAMUSCULAR | Status: DC | PRN

## 2012-08-04 MED ORDER — PROMETHAZINE HCL 12.5 MG PO TABS
6.2500 mg | ORAL_TABLET | Freq: Four times a day (QID) | ORAL | Status: DC | PRN
  Filled 2012-08-04: qty 1

## 2012-08-04 MED ORDER — 0.9 % SODIUM CHLORIDE (POUR BTL) OPTIME
TOPICAL | Status: DC | PRN
  Administered 2012-08-04: 1000 mL

## 2012-08-04 MED ORDER — GLIMEPIRIDE 4 MG PO TABS
4.0000 mg | ORAL_TABLET | Freq: Every day | ORAL | Status: DC
  Administered 2012-08-05 – 2012-08-07 (×3): 4 mg via ORAL
  Filled 2012-08-04 (×5): qty 1

## 2012-08-04 MED ORDER — ACETAMINOPHEN 325 MG PO TABS: 650.0000 mg | ORAL_TABLET | Freq: Four times a day (QID) | ORAL | Status: DC | PRN

## 2012-08-04 MED ORDER — KETOROLAC TROMETHAMINE 30 MG/ML IJ SOLN
30.0000 mg | Freq: Once | INTRAMUSCULAR | Status: AC
  Administered 2012-08-04: 30 mg via INTRAVENOUS

## 2012-08-04 SURGICAL SUPPLY — 56 items
BAG SPEC THK2 15X12 ZIP CLS (MISCELLANEOUS) ×2
BAG ZIPLOCK 12X15 (MISCELLANEOUS) ×3 IMPLANT
BANDAGE ADHESIVE 1X3 (GAUZE/BANDAGES/DRESSINGS) ×1 IMPLANT
BANDAGE ELASTIC 6 VELCRO ST LF (GAUZE/BANDAGES/DRESSINGS) ×3 IMPLANT
BANDAGE ESMARK 6X9 LF (GAUZE/BANDAGES/DRESSINGS) ×2 IMPLANT
BLADE SAG 18X100X1.27 (BLADE) ×3 IMPLANT
BLADE SAW SGTL 11.0X1.19X90.0M (BLADE) ×3 IMPLANT
BNDG CMPR 9X6 STRL LF SNTH (GAUZE/BANDAGES/DRESSINGS) ×2
BNDG ESMARK 6X9 LF (GAUZE/BANDAGES/DRESSINGS) ×3
BOWL SMART MIX CTS (DISPOSABLE) ×3 IMPLANT
CEMENT HV SMART SET (Cement) ×6 IMPLANT
CLOTH BEACON ORANGE TIMEOUT ST (SAFETY) ×3 IMPLANT
CUFF TOURN SGL QUICK 34 (TOURNIQUET CUFF) ×3
CUFF TRNQT CYL 34X4X40X1 (TOURNIQUET CUFF) ×2 IMPLANT
DRAPE EXTREMITY T 121X128X90 (DRAPE) ×3 IMPLANT
DRAPE POUCH INSTRU U-SHP 10X18 (DRAPES) ×3 IMPLANT
DRAPE U-SHAPE 47X51 STRL (DRAPES) ×3 IMPLANT
DRSG ADAPTIC 3X8 NADH LF (GAUZE/BANDAGES/DRESSINGS) ×3 IMPLANT
DRSG PAD ABDOMINAL 8X10 ST (GAUZE/BANDAGES/DRESSINGS) ×1 IMPLANT
DURAPREP 26ML APPLICATOR (WOUND CARE) ×3 IMPLANT
ELECT REM PT RETURN 9FT ADLT (ELECTROSURGICAL) ×3
ELECTRODE REM PT RTRN 9FT ADLT (ELECTROSURGICAL) ×2 IMPLANT
EVACUATOR 1/8 PVC DRAIN (DRAIN) ×3 IMPLANT
FACESHIELD LNG OPTICON STERILE (SAFETY) ×15 IMPLANT
GLOVE BIO SURGEON STRL SZ7.5 (GLOVE) ×2 IMPLANT
GLOVE BIO SURGEON STRL SZ8 (GLOVE) ×3 IMPLANT
GLOVE BIOGEL PI IND STRL 8 (GLOVE) ×4 IMPLANT
GLOVE BIOGEL PI INDICATOR 8 (GLOVE) ×1
GLOVE SURG SS PI 6.5 STRL IVOR (GLOVE) ×6 IMPLANT
GOWN STRL NON-REIN LRG LVL3 (GOWN DISPOSABLE) ×7 IMPLANT
GOWN STRL REIN XL XLG (GOWN DISPOSABLE) ×3 IMPLANT
HANDPIECE INTERPULSE COAX TIP (DISPOSABLE) ×3
IMMOBILIZER KNEE 20 (SOFTGOODS) ×3
IMMOBILIZER KNEE 20 THIGH 36 (SOFTGOODS) ×2 IMPLANT
KIT BASIN OR (CUSTOM PROCEDURE TRAY) ×3 IMPLANT
MANIFOLD NEPTUNE II (INSTRUMENTS) ×3 IMPLANT
NDL SAFETY ECLIPSE 18X1.5 (NEEDLE) ×2 IMPLANT
NEEDLE HYPO 18GX1.5 SHARP (NEEDLE) ×3
NS IRRIG 1000ML POUR BTL (IV SOLUTION) ×3 IMPLANT
PACK TOTAL JOINT (CUSTOM PROCEDURE TRAY) ×3 IMPLANT
PAD ABD 7.5X8 STRL (GAUZE/BANDAGES/DRESSINGS) ×3 IMPLANT
PADDING CAST COTTON 6X4 STRL (CAST SUPPLIES) ×8 IMPLANT
POSITIONER SURGICAL ARM (MISCELLANEOUS) ×3 IMPLANT
SET HNDPC FAN SPRY TIP SCT (DISPOSABLE) ×2 IMPLANT
SPONGE GAUZE 4X4 12PLY (GAUZE/BANDAGES/DRESSINGS) ×3 IMPLANT
STRIP CLOSURE SKIN 1/2X4 (GAUZE/BANDAGES/DRESSINGS) ×6 IMPLANT
SUCTION FRAZIER 12FR DISP (SUCTIONS) ×3 IMPLANT
SUT MNCRL AB 4-0 PS2 18 (SUTURE) ×3 IMPLANT
SUT VIC AB 2-0 CT1 27 (SUTURE) ×9
SUT VIC AB 2-0 CT1 TAPERPNT 27 (SUTURE) ×6 IMPLANT
SUT VLOC 180 0 24IN GS25 (SUTURE) ×3 IMPLANT
SYR 50ML LL SCALE MARK (SYRINGE) ×3 IMPLANT
TOWEL OR 17X26 10 PK STRL BLUE (TOWEL DISPOSABLE) ×6 IMPLANT
TRAY FOLEY CATH 14FRSI W/METER (CATHETERS) ×3 IMPLANT
WATER STERILE IRR 1500ML POUR (IV SOLUTION) ×4 IMPLANT
WRAP KNEE MAXI GEL POST OP (GAUZE/BANDAGES/DRESSINGS) ×5 IMPLANT

## 2012-08-04 NOTE — H&P (View-Only) (Signed)
Carly Buckley  DOB: 10/07/1945 Married / Language: English / Race: White Female  Date of Admission:  08/04/2012  Chief Complaint:  Right Knee Pain  History of Present Illness The patient is a 66 year old female who comes in for a preoperative History and Physical. The patient is scheduled for a right total knee arthroplasty to be performed by Dr. Frank V. Aluisio, MD at [facility] on 08/04/2012. The patient is a 66 year old female who presents with knee complaints. The patient reports right knee symptoms including: pain (on the anterior side and can radiate up her leg), swelling and instability which began year(s) ago without any known injury (Patient states that she was doing yard work years ago and pushed her knee against the cart. It has hurt since. It seems to get better with rest. ).The patient feels that the symptoms are worsening. The patient states that both knees hurt equally, right potentially a little bit worse. She has had significant functional limitations due to her knees. She said that they hurt her at all times, including at night. They are limiting what she can and cannot do. They are even affecting her ability to do activities of daily living. She has had injection a few years ago of cortisone, which did not provide any benefit. She is not having any lower extremity weakness or paresthesia. She is starting to get back pain and buttock pain. She is not having groin pain. She is at a stage now where she feels as though something needs to be done with the knees because she cannot tolerate this anymore. They have been treated conservatively in the past for the above stated problem and despite conservative measures, they continue to have progressive pain and severe functional limitations and dysfunction. They have failed non-operative management including home exercise, medications, and injections. It is felt that they would benefit from undergoing total joint  replacement. Risks and benefits of the procedure have been discussed with the patient and they elect to proceed with surgery. There are no active contraindications to surgery such as ongoing infection or rapidly progressive neurological disease. She was originally scheduled for 06/23/2012 but due to inclement weather and ice, she was unable to go through the surgery at that time and now has been rescheduled for the right knee procedure.   Problem List Primary osteoarthritis of one knee, right (715.16)   Allergies Morphine Derivatives. Sickness   Family History Kidney disease. father Cancer. mother and child Hypertension. mother and brother Diabetes Mellitus. father and brother Drug / Alcohol Addiction. brother   Social History Alcohol use. never consumed alcohol Children. 4 Advance Directives. Living Will, Healthcare POA Post-Surgical Plans. Patient wants to look into SNF versus going home. Drug/Alcohol Rehab (Previously). no Exercise. Exercises never Number of flights of stairs before winded. greater than 5 Drug/Alcohol Rehab (Currently). no Illicit drug use. no Living situation. live with spouse Marital status. married Current work status. retired Tobacco use. former smoker; smoke(d) less than 1/2 pack(s) per day Pain Contract. no Tobacco / smoke exposure. no   Past Surgical History Colectomy. partial Colon Polyp Removal - Colonoscopy   Medical History High blood pressure Colon Cancer Diabetes Mellitus, Type II Diverticulitis Of Colon   Review of Systems General:Not Present- Chills, Fever, Night Sweats, Fatigue, Weight Gain, Weight Loss and Memory Loss. Skin:Not Present- Hives, Itching, Rash, Eczema and Lesions. HEENT:Not Present- Tinnitus, Headache, Double Vision, Visual Loss, Hearing Loss and Dentures. Respiratory:Not Present- Shortness of breath with exertion, Shortness of   breath at rest, Allergies, Coughing up blood and  Chronic Cough. Cardiovascular:Not Present- Chest Pain, Racing/skipping heartbeats, Difficulty Breathing Lying Down, Murmur, Swelling and Palpitations. Gastrointestinal:Not Present- Bloody Stool, Heartburn, Abdominal Pain, Vomiting, Nausea, Constipation, Diarrhea, Difficulty Swallowing, Jaundice and Loss of appetitie. Female Genitourinary:Not Present- Blood in Urine, Urinary frequency, Weak urinary stream, Discharge, Flank Pain, Incontinence, Painful Urination, Urgency, Urinary Retention and Urinating at Night. Musculoskeletal:Present- Joint Swelling, Joint Pain and Back Pain. Not Present- Muscle Weakness, Muscle Pain, Morning Stiffness and Spasms. Neurological:Not Present- Tremor, Dizziness, Blackout spells, Paralysis, Difficulty with balance and Weakness. Psychiatric:Not Present- Insomnia.   Vitals Weight: 195 lb Height: 65 in Weight was reported by patient. Height was reported by patient. Body Surface Area: 2.01 m Body Mass Index: 32.45 kg/m Pulse: 96 (Regular) Resp.: 16 (Unlabored) BP: 158/68 (Sitting, Left Arm, Standard)    Physical Exam The physical exam findings are as follows:   General Mental Status - Alert, cooperative and good historian. General Appearance- pleasant. Not in acute distress. Orientation- Oriented X3. Build & Nutrition- Well nourished and Well developed.   Head and Neck Head- normocephalic, atraumatic . Neck Global Assessment- supple. no bruit auscultated on the right and no bruit auscultated on the left.   Note: upper and lower dentures  Eye Vision- Wears corrective lenses. Pupil- Bilateral- Regular and Round. Motion- Bilateral- EOMI.   Chest and Lung Exam Auscultation: Breath sounds:- clear at anterior chest wall and - clear at posterior chest wall. Adventitious sounds:- No Adventitious sounds.   Cardiovascular Auscultation:Rhythm- Regular rate and rhythm. Heart Sounds- S1 WNL and S2 WNL. Murmurs &  Other Heart Sounds: Murmur 1:Location- Aortic Area. Timing- Mid-diastolic. Grade- II/VI. Character- Low pitched.   Abdomen Palpation/Percussion:Tenderness- Abdomen is non-tender to palpation. Rigidity (guarding)- Abdomen is soft. Auscultation:Auscultation of the abdomen reveals - Bowel sounds normal.   Female Genitourinary   Note: Not done, not pertinent to present illness  Musculoskeletal On exam a well developed female, alert and oriented in no apparent distress. Both hips have normal range of motion with no discomfort. Her left knee shows no effusion. Range about 0 to 125 with slight crepitus on range of motion. Slight tenderness medial greater than lateral with no instability. The right knee, slight varus, range 5 to 125. Moderate crepitus on range of motion. Tender medial greater than lateral with no instability. Pulses sensation motor intact both lower extremities.  RADIOGRAPHS: AP both knees and lateral show bone on bone in the medial compartment of both knees. She also has significant patellofemoral narrowing in the right worse than left knee.  Assessment & Plan Primary osteoarthritis of one knee, right (715.16)  Note: Plan is for a Right Total Knee Replacement by Dr. Aluisio.  Plan is to go home.  PCP - Dr. Tabori - Patient has been seen preoperatively and felt to be stable for surgery. The patient was recommended to hold aspirin 2 weeks prior to surgery and hold metformin one day prior to surgery. Surveillance for postoperative MI with EKG immediately postoperatively and on postoperative days 1 and 2 AND troponin levels 24 hours postoperatively and on day 4 or hospital discharge (whichever come first). Prophylaxis for cardiac events with perioperative beta-blockers should be considered, specific regimen per Anesthesia. Invasive hemodynamic monitoring perioperatively at the discretion of anesthesiologit. DVT prophylaxis postoperatively to be chosen by  surgical team. Careful attention to postoperative glycemic control, may require sliding scale insulin.  Signed electronically by DREW L PERKINS, PA-C 

## 2012-08-04 NOTE — Transfer of Care (Signed)
Immediate Anesthesia Transfer of Care Note  Patient: Carly Buckley  Procedure(s) Performed: Procedure(s): TOTAL KNEE ARTHROPLASTY (Right) STEROID INJECTION (Left)  Patient Location: PACU  Anesthesia Type:Spinal  Level of Consciousness: awake, alert , oriented and patient cooperative  Airway & Oxygen Therapy: Patient Spontanous Breathing and Patient connected to face mask  Post-op Assessment: Report given to PACU RN and Post -op Vital signs reviewed and stable  Post vital signs: stable  Complications: No apparent anesthesia complications

## 2012-08-04 NOTE — Evaluation (Signed)
Physical Therapy Evaluation Patient Details Name: Carly Buckley MRN: [id number] DOB: February 14, 1946 Today's Date: 08/04/2012 Time: 5366-4403 PT Time Calculation (min): 48 min  PT Assessment / Plan / Recommendation Clinical Impression  67 yo female admitted 08/04/12 for RTKA. Pt. was able to stand and take a few steps today even though pt. had stated to wait for tomorrow..Pt.  able to lift leg from bed. Pt. plans to DC home with 24/7 caregivers. Pt. will benefit from PT while in acute care.    PT Assessment  Patient needs continued PT services    Follow Up Recommendations  Home health PT    Does the patient have the potential to tolerate intense rehabilitation      Barriers to Discharge        Equipment Recommendations  Rolling walker with 5" wheels    Recommendations for Other Services     Frequency 7X/week    Precautions / Restrictions Precautions Precautions: Knee Required Braces or Orthoses: Knee Immobilizer - Right Knee Immobilizer - Right: Discontinue once straight leg raise with < 10 degree lag   Pertinent Vitals/Pain States R knee is sore. No nausea.      Mobility  Bed Mobility Bed Mobility: Supine to Sit;Sit to Supine;Sitting - Scoot to Edge of Bed Supine to Sit: 4: Min assist Sitting - Scoot to [place] of Bed: 4: Min assist Sit to Supine: 4: Min assist Details for Bed Mobility Assistance: Pt. is able to manage her leg  to edge of bed and back onto bed with min. assistance. Transfers Transfers: Sit to Stand;Stand to Sit Sit to Stand: 4: Min assist;From bed;With upper extremity assist Stand to Sit: To bed;4: Min assist;With upper extremity assist Details for Transfer Assistance: cues for Hand placement and R leg position. Pt had a "block" when trying to stand up. able to attempt different techniqes and easily stood up at RW. Ambulation/Gait Ambulation/Gait Assistance: 4: Min assist Ambulation Distance (Feet): 5 Feet (forward and back to bed.) Assistive device:  Rolling walker Ambulation/Gait Assistance Details: cues fro sequence. Gait Pattern: Step-to pattern;Antalgic;Decreased stance time - right    Exercises Total Joint Exercises Quad Sets: AROM;Right;5 reps;Supine   PT Diagnosis: Difficulty walking  PT Problem List: Decreased strength;Decreased range of motion;Decreased activity tolerance;Decreased mobility;Decreased knowledge of precautions;Decreased safety awareness;Decreased knowledge of use of DME PT Treatment Interventions: DME instruction;Gait training;Stair training;Functional mobility training;Therapeutic activities;Therapeutic exercise;Patient/family education   PT Goals Acute Rehab PT Goals PT Goal Formulation: With patient/family Time For Goal Achievement: 08/11/12 Potential to Achieve Goals: Good Pt will go Supine/Side to Sit: with supervision PT Goal: Supine/Side to Sit - Progress: Goal set today Pt will go Sit to Supine/Side: with supervision PT Goal: Sit to Supine/Side - Progress: Goal set today Pt will go Sit to Stand: with supervision;with upper extremity assist PT Goal: Sit to Stand - Progress: Goal set today Pt will go Stand to Sit: with supervision;with upper extremity assist PT Goal: Stand to Sit - Progress: Goal set today Pt will Ambulate: 51 - 150 feet;with supervision;with rolling walker PT Goal: Ambulate - Progress: Goal set today Pt will Go Up / Down Stairs: 1-2 stairs;with min assist;with least restrictive assistive device PT Goal: Up/Down Stairs - Progress: Goal set today Pt will Perform Home Exercise Program: with supervision, verbal cues required/provided PT Goal: Perform Home Exercise Program - Progress: Goal set today  Visit Information  Last PT Received On: 08/04/12 Assistance Needed: +1    Subjective Data  Subjective: Can we wait and walk tomorrow?  Patient Stated Goal: to go home.   Prior Functioning  Home Living Lives With: Spouse Available Help at Discharge: Family Type of Home: House Home  Access: Stairs to enter Secretary/administrator of Steps: 3 Entrance Stairs-Rails: Right;Left;Can reach both Home Layout: One level Bathroom Shower/Tub: Network engineer: None Prior Function Level of Independence: Independent Able to Take Stairs?: Yes Communication Communication: No difficulties    Cognition  Cognition Arousal/Alertness: Awake/alert Behavior During Therapy: WFL for tasks assessed/performed Overall Cognitive Status: Within Functional Limits for tasks assessed    Extremity/Trunk Assessment Right Lower Extremity Assessment RLE ROM/Strength/Tone: Deficits RLE ROM/Strength/Tone Deficits: able to perform SLR. RLE Sensation: History of peripheral neuropathy Left Lower Extremity Assessment LLE ROM/Strength/Tone: Within functional levels;Deficits LLE ROM/Strength/Tone Deficits: hsad cortisone shot in surgery LLE Sensation: History of peripheral neuropathy   Balance    End of Session PT - End of Session Equipment Utilized During Treatment: Right knee immobilizer Activity Tolerance: Patient tolerated treatment well Patient left: in bed;with call bell/phone within reach;with family/visitor present Nurse Communication: Mobility status CPM Right Knee CPM Right Knee: Off  GP     Rada Hay 08/04/2012, 4:54 PM

## 2012-08-04 NOTE — Interval H&P Note (Signed)
History and Physical Interval Note:  08/04/2012 6:54 AM  Carly Buckley  has presented today for surgery, with the diagnosis of OA OF RIGHT KNEE  The various methods of treatment have been discussed with the patient and family. After consideration of risks, benefits and other options for treatment, the patient has consented to  Procedure(s): TOTAL KNEE ARTHROPLASTY (Right) as a surgical intervention .  The patient's history has been reviewed, patient examined, no change in status, stable for surgery.  I have reviewed the patient's chart and labs.  Questions were answered to the patient's satisfaction.     Loanne Drilling

## 2012-08-04 NOTE — Anesthesia Postprocedure Evaluation (Signed)
Anesthesia Post Note  Patient: Carly Buckley  Procedure(s) Performed: Procedure(s) (LRB): TOTAL KNEE ARTHROPLASTY (Right) STEROID INJECTION (Left)  Anesthesia type: Spinal  Patient location: PACU  Post pain: Pain level controlled  Post assessment: Post-op Vital signs reviewed  Last Vitals: BP 116/54  Pulse 89  Temp(Src) 36.6 C (Oral)  Resp 16  Ht 5\' 6"  (1.676 m)  Wt 198 lb (89.812 kg)  BMI 31.97 kg/m2  SpO2 92%  Post vital signs: Reviewed  Level of consciousness: sedated  Complications: No apparent anesthesia complications

## 2012-08-04 NOTE — Progress Notes (Signed)
Utilization review completed.  

## 2012-08-04 NOTE — Anesthesia Procedure Notes (Signed)
Spinal  Patient location during procedure: OR Start time: 08/04/2012 9:18 AM End time: 08/04/2012 9:21 AM Staffing Anesthesiologist: Lewie Loron R Performed by: anesthesiologist  Preanesthetic Checklist Completed: patient identified, site marked, surgical consent, pre-op evaluation, timeout performed, IV checked, risks and benefits discussed and monitors and equipment checked Spinal Block Patient position: sitting Prep: ChloraPrep Patient monitoring: heart rate, continuous pulse ox and blood pressure Approach: midline Location: L3-4 Injection technique: single-shot Needle Needle type: Quincke  Needle gauge: 22 G Needle length: 9 cm Assessment Sensory level: T8 Additional Notes Expiration date of kit checked and confirmed. Patient tolerated procedure well, without complications.

## 2012-08-04 NOTE — Anesthesia Preprocedure Evaluation (Addendum)
Anesthesia Evaluation  Patient identified by MRN, date of birth, ID band Patient awake    Reviewed: Allergy & Precautions, H&P , NPO status , Patient's Chart, lab work & pertinent test results  History of Anesthesia Complications (+) PONV  Airway Mallampati: II TM Distance: >3 FB Neck ROM: Full    Dental  (+) Dental Advisory Given, Edentulous Upper and Edentulous Lower   Pulmonary former smoker,  breath sounds clear to auscultation        Cardiovascular hypertension, Rhythm:Regular Rate:Normal     Neuro/Psych Idiopathic peripheral neuropathy negative psych ROS   GI/Hepatic negative GI ROS, Neg liver ROS,   Endo/Other  diabetes, Type 2  Renal/GU negative Renal ROS     Musculoskeletal negative musculoskeletal ROS (+) Arthritis -, Osteoarthritis,    Abdominal   Peds  Hematology negative hematology ROS (+)   Anesthesia Other Findings   Reproductive/Obstetrics negative OB ROS                          Anesthesia Physical Anesthesia Plan  ASA: III  Anesthesia Plan: Spinal   Post-op Pain Management:    Induction: Intravenous  Airway Management Planned: Simple Face Mask and Nasal Cannula  Additional Equipment:   Intra-op Plan:   Post-operative Plan:   Informed Consent: I have reviewed the patients History and Physical, chart, labs and discussed the procedure including the risks, benefits and alternatives for the proposed anesthesia with the patient or authorized representative who has indicated his/her understanding and acceptance.   Dental advisory given  Plan Discussed with: CRNA  Anesthesia Plan Comments:        Anesthesia Quick Evaluation

## 2012-08-04 NOTE — Op Note (Addendum)
Pre-operative diagnosis- Osteoarthritis  Bilateral knee(s)  Post-operative diagnosis- Osteoarthritis Bilateral knee(s)  Procedure-  Right  Total Knee Arthroplasty   Left knee cortisone injection  Surgeon- Gus Rankin. Waddell Iten, MD  Assistant- Dimitri Ped, PA-C   Anesthesia-  Spinal EBL-* No blood loss amount entered *  Drains Hemovac  Tourniquet time-  Total Tourniquet Time Documented: Thigh (Right) - 32 minutes Total: Thigh (Right) - 32 minutes    Complications- None  Condition-PACU - hemodynamically stable.   Brief Clinical Note  Carly Buckley is a 67 y.o. year old female with end stage OA of her right knee with progressively worsening pain and dysfunction. She has constant pain, with activity and at rest and significant functional deficits with difficulties even with ADLs. She has had extensive non-op management including analgesics, injections of cortisone and viscosupplements, and home exercise program, but remains in significant pain with significant dysfunction.Radiographs show bone on bone arthritis medial compartment. She also has significant left knee OA and pain and requests a cortisone injection left knee. She presents now for right Total Knee Arthroplasty and cortisone injection left knee.  Procedure in detail---   The patient is brought into the operating room and positioned supine on the operating table. After successful administration of  Spinal,   a tourniquet is placed high on the  Right thigh(s) and the lower extremity is prepped and draped in the usual sterile fashion. Time out is performed by the operating team and then the  Right lower extremity is wrapped in Esmarch, knee flexed and the tourniquet inflated to 300 mmHg.       A midline incision is made with a ten blade through the subcutaneous tissue to the level of the extensor mechanism. A fresh blade is used to make a medial parapatellar arthrotomy. Soft tissue over the proximal medial tibia is subperiosteally  elevated to the joint line with a knife and into the semimembranosus bursa with a Cobb elevator. Soft tissue over the proximal lateral tibia is elevated with attention being paid to avoiding the patellar tendon on the tibial tubercle. The patella is everted, knee flexed 90 degrees and the ACL and PCL are removed. Findings are bone on bone medial with medial osteophyte formation.        The drill is used to create a starting hole in the distal femur and the canal is thoroughly irrigated with sterile saline to remove the fatty contents. The 5 degree Right  valgus alignment guide is placed into the femoral canal and the distal femoral cutting block is pinned to remove 10 mm off the distal femur. Resection is made with an oscillating saw.      The tibia is subluxed forward and the menisci are removed. The extramedullary alignment guide is placed referencing proximally at the medial aspect of the tibial tubercle and distally along the second metatarsal axis and tibial crest. The block is pinned to remove 2mm off the more deficient medial  side. Resection is made with an oscillating saw. Size 3is the most appropriate size for the tibia and the proximal tibia is prepared with the modular drill and keel punch for that size.      The femoral sizing guide is placed and size 4 narrow is most appropriate. Rotation is marked off the epicondylar axis and confirmed by creating a rectangular flexion gap at 90 degrees. The size 4 cutting block is pinned in this rotation and the anterior, posterior and chamfer cuts are made with the oscillating saw. The intercondylar  block is then placed and that cut is made.      Trial size 3 tibial component, trial size 4 narrow posterior stabilized femur and a 12.5  mm posterior stabilized rotating platform insert trial is placed. Full extension is achieved with excellent varus/valgus and anterior/posterior balance throughout full range of motion. The patella is everted and thickness measured  to be 22  mm. Free hand resection is taken to 12 mm, a 35 template is placed, lug holes are drilled, trial patella is placed, and it tracks normally. Osteophytes are removed off the posterior femur with the trial in place. All trials are removed and the cut bone surfaces prepared with pulsatile lavage. Cement is mixed and once ready for implantation, the size 3 tibial implant, size  4 narrow posterior stabilized femoral component, and the size 35 patella are cemented in place and the patella is held with the clamp. The trial insert is placed and the knee held in full extension. The Exparel (20 ml mixed with 50 ml saline) is injected into the extensor mechanism, posterior capsule, medial and lateral gutters and subcutaneous tissues.  All extruded cement is removed and once the cement is hard the permanent 12.5 mm posterior stabilized rotating platform insert is placed into the tibial tray.      The wound is copiously irrigated with saline solution and the extensor mechanism closed over a hemovac drain with #1 PDS suture. The tourniquet is released for a total tourniquet time of 32  minutes. Flexion against gravity is 140 degrees and the patella tracks normally. Subcutaneous tissue is closed with 2.0 vicryl and subcuticular with running 4.0 Monocryl. The incision is cleaned and dried and steri-strips and a bulky sterile dressing are applied. The limb is placed into a knee immobilizer and then the left knee is prepped with Betadine and injected with 2 ml (80 mg) of Depomedrol. The injection is tolerated without difficulty and the patient is awakened and transported to recovery in stable condition.      Please note that a surgical assistant was a medical necessity for this procedure in order to perform it in a safe and expeditious manner. Surgical assistant was necessary to retract the ligaments and vital neurovascular structures to prevent injury to them and also necessary for proper positioning of the limb to allow  for anatomic placement of the prosthesis.   Gus Rankin Trelon Plush, MD    08/04/2012, 10:20 AM

## 2012-08-05 DIAGNOSIS — D62 Acute posthemorrhagic anemia: Secondary | ICD-10-CM | POA: Diagnosis not present

## 2012-08-05 HISTORY — DX: Acute posthemorrhagic anemia: D62

## 2012-08-05 LAB — BASIC METABOLIC PANEL
BUN: 20 mg/dL (ref 6–23)
CO2: 28 mEq/L (ref 19–32)
Calcium: 8.3 mg/dL — ABNORMAL LOW (ref 8.4–10.5)
Creatinine, Ser: 0.63 mg/dL (ref 0.50–1.10)
Glucose, Bld: 208 mg/dL — ABNORMAL HIGH (ref 70–99)

## 2012-08-05 LAB — CBC
Hemoglobin: 8.9 g/dL — ABNORMAL LOW (ref 12.0–15.0)
MCH: 30.6 pg (ref 26.0–34.0)
MCV: 91.1 fL (ref 78.0–100.0)
RBC: 2.91 MIL/uL — ABNORMAL LOW (ref 3.87–5.11)

## 2012-08-05 LAB — GLUCOSE, CAPILLARY: Glucose-Capillary: 148 mg/dL — ABNORMAL HIGH (ref 70–99)

## 2012-08-05 MED ORDER — METHOCARBAMOL 500 MG PO TABS: 500.0000 mg | ORAL_TABLET | Freq: Four times a day (QID) | ORAL | Status: DC | PRN

## 2012-08-05 MED ORDER — OXYCODONE HCL 5 MG PO TABS: 5.0000 mg | ORAL_TABLET | ORAL | Status: DC | PRN

## 2012-08-05 MED ORDER — TRAMADOL HCL 50 MG PO TABS: 50.0000 mg | ORAL_TABLET | Freq: Four times a day (QID) | ORAL | Status: DC | PRN

## 2012-08-05 MED ORDER — RIVAROXABAN 10 MG PO TABS: 10.0000 mg | ORAL_TABLET | Freq: Every day | ORAL | Status: DC

## 2012-08-05 NOTE — Progress Notes (Addendum)
   Subjective: 1 Day Post-Op Procedure(s) (LRB): TOTAL KNEE ARTHROPLASTY (Right) STEROID INJECTION (Left) Patient reports pain as mild.   Patient seen in rounds with Dr. Lequita Halt. Patient is well, and has had no acute complaints or problems. She reports that she did get a little rest last night. She is already feeling a little better this morning compared to yesterday. No issues overnight. No complaints of shortness of breath and chest pain.  We will start therapy today.  Plan is to go Home after hospital stay.  Objective: Vital signs in last 24 hours: Temp:  [97 F (36.1 C)-98.5 F (36.9 C)] 97.7 F (36.5 C) (04/22 1610) Pulse Rate:  [70-91] 70 (04/22 0613) Resp:  [12-19] 16 (04/22 0613) BP: (103-125)/(54-80) 115/66 mmHg (04/22 0613) SpO2:  [92 %-100 %] 97 % (04/22 0613) Weight:  [89.812 kg (198 lb)] 89.812 kg (198 lb) (04/21 1203)  Intake/Output from previous day:  Intake/Output Summary (Last 24 hours) at 08/05/12 0724 Last data filed at 08/05/12 9604  Gross per 24 hour  Intake 2572.5 ml  Output   2294 ml  Net  278.5 ml     Labs:  Recent Labs  08/05/12 0442  HGB 8.9*    Recent Labs  08/05/12 0442  WBC 7.1  RBC 2.91*  HCT 26.5*  PLT 140*    Recent Labs  08/05/12 0442  NA 128*  K 4.3  CL 94*  CO2 28  BUN 20  CREATININE 0.63  GLUCOSE 208*  CALCIUM 8.3*    EXAM General - Patient is Alert and Oriented Extremity - Neurologically intact Dorsiflexion/Plantar flexion intact Dressing - dressing C/D/I Motor Function - intact, moving foot and toes well on exam.  Hemovac pulled without difficulty.  Past Medical History  Diagnosis Date  . Colon cancer     colon ca dx 07  . Diabetes mellitus   . Hypertension   . Malignant neoplasm of colon   . Neuropathy, idiopathic   . Type II diabetes mellitus without mention of complication   . Helicobacter pylori (H. pylori)   . Diverticulitis   . Colon cancer 2008  . Arthritis   . History of colon cancer 2007     colon resection  . Cough 07/24/12    productive, ? color, no fever, congested   . Dysrhythmia     palpitations  . Complication of anesthesia   . PONV (postoperative nausea and vomiting)     Assessment/Plan: 1 Day Post-Op Procedure(s) (LRB): TOTAL KNEE ARTHROPLASTY (Right) STEROID INJECTION (Left) Principal Problem:   OA (osteoarthritis) of knee  Estimated body mass index is 31.97 kg/(m^2) as calculated from the following:   Height as of this encounter: 5\' 6"  (1.676 m).   Weight as of this encounter: 89.812 kg (198 lb). Advance diet Up with therapy D/C IV fluids when tolerating PO well Plan for discharge tomorrow  DVT Prophylaxis - Xarelto Weight-Bearing as tolerated to left leg No vaccines. D/C O2 and Pulse OX and try on Room Air  Will start therapy today. Plan on discharge tomorrow if progressing well with therapy.   CONSTABLE, AMBER [name] 08/05/2012, 7:24 AM  Post-op blood loss anemia- Pt asymptomatic this AM. Will follow with repeat CBC in AM 4/23

## 2012-08-05 NOTE — Progress Notes (Signed)
Physical Therapy Treatment Patient Details Name: Carly Buckley MRN: [id number] DOB: March 14, 1946 Today's Date: 08/05/2012 Time: 8756-4332 PT Time Calculation (min): 22 min  PT Assessment / Plan / Recommendation Comments on Treatment Session  Pt. has been nauseated but no longer. Pt. tolerated exercises with assistance. Just received Pain meds so will walk in 30-45 minutes.     Follow Up Recommendations  Home health PT     Does the patient have the potential to tolerate intense rehabilitation     Barriers to Discharge        Equipment Recommendations  Rolling walker with 5" wheels    Recommendations for Other Services    Frequency 7X/week   Plan Discharge plan remains appropriate;Frequency remains appropriate    Precautions / Restrictions Precautions Precautions: Knee   Pertinent Vitals/Pain 5 R knee    Mobility       Exercises Total Joint Exercises Quad Sets: AROM;Both;10 reps;Supine Heel Slides: AAROM;Right;10 reps;Supine Hip ABduction/ADduction: AROM;Right;20 reps;Supine Straight Leg Raises: AAROM;Right;20 reps;Supine Goniometric ROM: 40    PT Diagnosis:    PT Problem List:   PT Treatment Interventions:     PT Goals Acute Rehab PT Goals PT Goal Formulation: With patient/family Pt will Perform Home Exercise Program: with supervision, verbal cues required/provided PT Goal: Perform Home Exercise Program - Progress: Progressing toward goal  Visit Information  Last PT Received On: 08/05/12 Assistance Needed: +1    Subjective Data  Subjective: I will need some medicine if I am gfoing to walk/   Cognition  Cognition Arousal/Alertness: Awake/alert    Balance     End of Session PT - End of Session Activity Tolerance: Patient tolerated treatment well Patient left: in bed;with call bell/phone within reach;with family/visitor present Nurse Communication: Patient requests pain meds CPM Right Knee CPM Right Knee: Off Right Knee Flexion (Degrees): 40 Right Knee  Extension (Degrees): 10   GP     Rada Hay 08/05/2012, 10:57 AM

## 2012-08-05 NOTE — Progress Notes (Signed)
Physical Therapy Treatment Patient Details Name: Carly Buckley MRN: [id number] DOB: [birth year] Today's Date: 08/05/2012 Time: 4098-1191 PT Time Calculation (min): 23 min  PT Assessment / Plan / Recommendation Comments on Treatment Session  Pt. again limited by nausea and vomiting.  Pt. unable to progress at this time. continue attempt to ambulate later.    Follow Up Recommendations  Home health PT     Does the patient have the potential to tolerate intense rehabilitation     Barriers to Discharge        Equipment Recommendations  Rolling walker with 5" wheels    Recommendations for Other Services    Frequency 7X/week   Plan Discharge plan remains appropriate;Frequency remains appropriate    Precautions / Restrictions Precautions Precautions: Knee Precaution Comments: N/V Required Braces or Orthoses: Knee Immobilizer - Right Knee Immobilizer - Right: Discontinue once straight leg raise with < 10 degree lag   Pertinent Vitals/Pain Reports pain at 5, RN notified    Mobility  Bed Mobility Supine to Sit: 4: Min assist Sitting - Scoot to Edge of Bed: 3: Mod assist Details for Bed Mobility Assistance: Pt. wanted assistance  to get to sitting, encouraged pt to do as much for self Transfers Sit to Stand: 1: +2 Total assist;From bed;With upper extremity assist Sit to Stand: Patient Percentage: 70% Stand to Sit: To chair/3-in-1;With armrests;3: Mod assist Details for Transfer Assistance:  cues to push from bed with UE and RW each, pt again seems to have a block on how to stand. pt. c/o nausea after ambulating 15 ', positive emesis. , cues to reach to recliner. Ambulation/Gait Ambulation/Gait Assistance: 1: +2 Total assist Ambulation/Gait: Patient Percentage: 60% Ambulation Distance (Feet): 15 Feet Assistive device: Rolling walker Ambulation/Gait Assistance Details: Pt. became nauseated  and vomited after sitting down, RN aware. Cues for sequence and posture. Gait Pattern:  Step-to pattern;Antalgic    Exercises     PT Diagnosis:    PT Problem List:   PT Treatment Interventions:     PT Goals Acute Rehab PT Goals Pt will go Supine/Side to Sit: with supervision PT Goal: Supine/Side to Sit - Progress: Progressing toward goal Pt will go Sit to Stand: with supervision;with upper extremity assist PT Goal: Sit to Stand - Progress: Progressing toward goal Pt will go Stand to Sit: with supervision;with upper extremity assist PT Goal: Stand to Sit - Progress: Progressing toward goal Pt will Ambulate: 51 - 150 feet;with supervision;with rolling walker PT Goal: Up/Down Stairs - Progress: Progressing toward goal  Visit Information  Last PT Received On: 08/05/12 Assistance Needed: +2    Subjective Data  Subjective: I still feel nausea.   Cognition  Cognition Arousal/Alertness: Awake/alert    Balance     End of Session PT - End of Session Equipment Utilized During Treatment: Right knee immobilizer Activity Tolerance: Patient limited by fatigue (limited by nausea.) Patient left: in chair;with call bell/phone within reach;with family/visitor present Nurse Communication:  (N/V)   GP     Rada Hay 08/05/2012, 4:39 PM

## 2012-08-05 NOTE — Clinical Documentation Improvement (Signed)
Anemia Blood Loss Clarification  THIS DOCUMENT IS NOT A PERMANENT PART OF THE MEDICAL RECORD  RESPOND TO THE THIS QUERY, FOLLOW THE INSTRUCTIONS BELOW:  1. If needed, update documentation for the patient's encounter via the notes activity.  2. Access this query again and click edit on the In [employer].  3. After updating, or not, click F2 to complete all highlighted (required) fields concerning your review. Select "additional documentation in the medical record" OR "no additional documentation provided".  4. Click Sign note button.  5. The deficiency will fall out of your In Basket *Please let us know if you are not able to complete this workflow by phone or e-mail (listed below).        08/05/12  Dear Carly Lofts Tamala Ser[facility]  In an effort to better capture your patient's severity of illness, reflect appropriate length of stay and utilization of resources, a review of the patient medical record has revealed the following indicators.    Based on your clinical judgment, please clarify and document in a progress note and/or discharge summary the clinical condition associated with the following supporting information:  In responding to this query please exercise your independent judgment.  The fact that a query is asked, does not imply that any particular answer is desired or expected.  Clarification Needed  Pt with post op H/H=8.9/26.5     Possible Clinical Conditions?   " Expected Acute Blood Loss Anemia  " Acute Blood Loss Anemia  " Acute on chronic blood loss anemia   " Other Condition________________  " Cannot Clinically Determine  Risk Factors: (recent surgery, pre op anemia, EBL in OR)  Supporting Information: OA knee, R TKA Signs and Symptoms Abnormal H/H  Diagnostics: Component     Latest Ref Rng 08/05/2012          Hemoglobin     12.0 - 15.0 g/dL 8.9 (L)  HCT     40.9 - 46.0 % 26.5 (L)  : Treatments: Monitoring: Serial H&H  monitoring  Reviewed: additional documentation in the medical record  Thank You,  Enis Slipper  RN, BSN, MSN/Inf, CCDS Clinical Documentation Specialist [facility] Dept Pager: [phone] / E-mail: [email or web address]  Health Information Management [place]

## 2012-08-05 NOTE — Progress Notes (Signed)
Received orders for rw commode and tub bench.  All has been delivered to hospital room.

## 2012-08-05 NOTE — Progress Notes (Signed)
Physical Therapy Treatment Patient Details Name: Carly Buckley MRN: [id number] DOB: 1945/07/08 Today's Date: 08/05/2012 Time: 4098-1191 PT Time Calculation (min): 19 min  PT Assessment / Plan / Recommendation Comments on Treatment Session  Pt. continues with c/o pain, nausea. RN reports max meds given at this time. Progress continues to be slow due to nausea/pain.  Family present and aware.    Follow Up Recommendations  Home health PT     Does the patient have the potential to tolerate intense rehabilitation     Barriers to Discharge        Equipment Recommendations  Rolling walker with 5" wheels    Recommendations for Other Services    Frequency 7X/week   Plan Discharge plan remains appropriate;Frequency remains appropriate    Precautions / Restrictions Precautions Precautions: Knee Precaution Comments: N/V Required Braces or Orthoses: Knee Immobilizer - Right Knee Immobilizer - Right: Discontinue once straight leg raise with < 10 degree lag   Pertinent Vitals/Pain 6, RN aware.    Mobility  Bed Mobility Supine to Sit: 4: Min assist Sitting - Scoot to Edge of Bed: 3: Mod assist Sit to Supine: 4: Min assist Details for Bed Mobility Assistance: pt able to place leg onto bed with min assistance. Transfers Transfers: Stand Pivot Transfers Sit to Stand: From chair/3-in-1;With armrests;With upper extremity assist;3: Mod assist Sit to Stand: Patient Percentage: 70% Stand to Sit: 4: Min assist;To bed Stand Pivot Transfers: 3: Mod assist Details for Transfer Assistance: Pt. asking spouse to lift her from recliner. Instructed pt to push from armrests to stand, pt.required Mod assitance to stand. Pt. declines to walk, wants to return to bed. Assistive device: Rolling walker . Gait Pattern: Step-to pattern;Antalgic    Exercises     PT Diagnosis:    PT Problem List:   PT Treatment Interventions:     PT Goals Acute Rehab PT Goals Pt will go Supine/Side to Sit: with  supervision PT Goal: Supine/Side to Sit - Progress: Progressing toward goal Pt will go Sit to Stand: with supervision;with upper extremity assist PT Goal: Sit to Stand - Progress: Progressing toward goal Pt will go Stand to Sit: with supervision;with upper extremity assist PT Goal: Stand to Sit - Progress: Progressing toward goal Pt will Ambulate: 51 - 150 feet;with supervision;with rolling walker PT Goal: Up/Down Stairs - Progress: Progressing toward goal  Visit Information  Last PT Received On: 08/05/12 Assistance Needed: +1    Subjective Data  Subjective: I still feel nausea. I am hurting.   Cognition  Cognition Arousal/Alertness: Awake/alert    Balance     End of Session PT - End of Session Equipment Utilized During Treatment: Right knee immobilizer Activity Tolerance: Patient limited by fatigue (nausea.) Patient left: in bed;with call bell/phone within reach;with family/visitor present Nurse Communication:  (N/V)   GP     Rada Hay 08/05/2012, 4:46 PM

## 2012-08-06 DIAGNOSIS — E871 Hypo-osmolality and hyponatremia: Secondary | ICD-10-CM | POA: Diagnosis not present

## 2012-08-06 LAB — GLUCOSE, CAPILLARY
Glucose-Capillary: 200 mg/dL — ABNORMAL HIGH (ref 70–99)
Glucose-Capillary: 194 mg/dL — ABNORMAL HIGH (ref 70–99)

## 2012-08-06 LAB — CBC
Hemoglobin: 9 g/dL — ABNORMAL LOW (ref 12.0–15.0)
MCH: 30.4 pg (ref 26.0–34.0)
MCV: 89.5 fL (ref 78.0–100.0)
RBC: 2.96 MIL/uL — ABNORMAL LOW (ref 3.87–5.11)

## 2012-08-06 LAB — BASIC METABOLIC PANEL
CO2: 27 mEq/L (ref 19–32)
Calcium: 8.9 mg/dL (ref 8.4–10.5)
Creatinine, Ser: 0.56 mg/dL (ref 0.50–1.10)
Glucose, Bld: 206 mg/dL — ABNORMAL HIGH (ref 70–99)

## 2012-08-06 MED ORDER — HYDROCODONE-ACETAMINOPHEN 5-325 MG PO TABS: 1.0000 | ORAL_TABLET | ORAL | Status: DC | PRN

## 2012-08-06 MED ORDER — HYDROCODONE-ACETAMINOPHEN 5-325 MG PO TABS
1.0000 | ORAL_TABLET | ORAL | Status: DC | PRN
  Administered 2012-08-06: 1 via ORAL
  Administered 2012-08-06 – 2012-08-07 (×3): 2 via ORAL
  Filled 2012-08-06 (×2): qty 2
  Filled 2012-08-06: qty 1
  Filled 2012-08-06: qty 2

## 2012-08-06 NOTE — Progress Notes (Addendum)
   Subjective: 2 Days Post-Op Procedure(s) (LRB): TOTAL KNEE ARTHROPLASTY (Right) STEROID INJECTION (Left) Patient reports pain as moderate.   Patient is having problems with nausea related to the oxycocdone but did better on tramadol. Pain not fully relieved with the tramadol Plan is to go Home after hospital stay.  Objective: Vital signs in last 24 hours: Temp:  [97.6 F (36.4 C)-99.2 F (37.3 C)] 98.7 F (37.1 C) (04/23 0625) Pulse Rate:  [76-91] 83 (04/23 0625) Resp:  [14-18] 14 (04/23 0625) BP: (122-145)/(54-72) 132/71 mmHg (04/23 0625) SpO2:  [94 %-99 %] 96 % (04/23 0625)  Intake/Output from previous day:  Intake/Output Summary (Last 24 hours) at 08/06/12 0655 Last data filed at 08/06/12 0550  Gross per 24 hour  Intake    600 ml  Output   2400 ml  Net  -1800 ml    Intake/Output this shift: Total I/O In: 240 [I.V.:240] Out: 950 [Urine:950]  Labs:  Recent Labs  08/05/12 0442 08/06/12 0432  HGB 8.9* 9.0*    Recent Labs  08/05/12 0442 08/06/12 0432  WBC 7.1 8.6  RBC 2.91* 2.96*  HCT 26.5* 26.5*  PLT 140* 162    Recent Labs  08/05/12 0442 08/06/12 0432  NA 128* 127*  K 4.3 4.4  CL 94* 92*  CO2 28 27  BUN 20 15  CREATININE 0.63 0.56  GLUCOSE 208* 206*  CALCIUM 8.3* 8.9   No results found for this basename: LABPT, INR,  in the last 72 hours  EXAM General - Patient is Alert, Appropriate and Oriented Extremity - Neurologically intact Neurovascular intact Incision: dressing C/D/I No cellulitis present Compartment soft Dressing/Incision - clean, dry, no drainage Motor Function - intact, moving foot and toes well on exam.   Past Medical History  Diagnosis Date  . Colon cancer     colon ca dx 07  . Diabetes mellitus   . Hypertension   . Malignant neoplasm of colon   . Neuropathy, idiopathic   . Type II diabetes mellitus without mention of complication   . Helicobacter pylori (H. pylori)   . Diverticulitis   . Colon cancer 2008  .  Arthritis   . History of colon cancer 2007    colon resection  . Cough 07/24/12    productive, ? color, no fever, congested   . Dysrhythmia     palpitations  . Complication of anesthesia   . PONV (postoperative nausea and vomiting)     Assessment/Plan: 2 Days Post-Op Procedure(s) (LRB): TOTAL KNEE ARTHROPLASTY (Right) STEROID INJECTION (Left) Principal Problem:   OA (osteoarthritis) of knee Active Problems:   Postoperative anemia due to acute blood loss Pt asymptomatic and hemoglobin actually increased today Hyponatremia- asymptomatic. Recheck BMET in Am if she is not discharged later today  Advance diet Up with therapy D/C IV fluids Plan for discharge tomorrow or possibly this afternoon if nausea is fully resolved and she progresses well enough with PT. Will try Norco if tramadol not effective for pain relief  DVT Prophylaxis - Xarelto Weight-Bearing as tolerated to right leg  Reginal Wojcicki V 08/06/2012, 6:55 AM

## 2012-08-06 NOTE — Progress Notes (Signed)
Physical Therapy Treatment Patient Details Name: Carly Buckley MRN: [id number] DOB: 03-Dec-1945 Today's Date: 08/06/2012 Time: 4098-1191 PT Time Calculation (min): 24 min  PT Assessment / Plan / Recommendation Comments on Treatment Session  Afternoon session did not go as planned.  Pt only able to tolerate amb to and from bathroom with increased difficulty due to pain.  Unable to attempt stairs as planned. Pt stated, "I hope he doesn't send me home today".    Follow Up Recommendations  Home health PT     Does the patient have the potential to tolerate intense rehabilitation     Barriers to Discharge        Equipment Recommendations  Rolling walker with 5" wheels    Recommendations for Other Services    Frequency 7X/week   Plan Discharge plan remains appropriate;Frequency remains appropriate    Precautions / Restrictions Precautions Precautions: Knee Precaution Comments: N/V Required Braces or Orthoses: Knee Immobilizer - Right Knee Immobilizer - Right: Discontinue once straight leg raise with < 10 degree lag Restrictions Weight Bearing Restrictions: No   Pertinent Vitals/Pain C/o 10/10 RN notified    Mobility  Bed Mobility Bed Mobility: Supine to Sit;Sit to Supine Supine to Sit: 4: Min assist Sitting - Scoot to Edge of Bed: 4: Min assist Details for Bed Mobility Assistance: min assist to support R LE off/on bed  Transfers Transfers: Sit to Stand;Stand to Sit Sit to Stand: 4: Min guard;4: Min assist;From bed;From toilet Stand to Sit: 4: Min guard;4: Min assist;To bed;To toilet Details for Transfer Assistance: 25% VC's on proper hand placement to increase safety esp with turns. Ambulation/Gait Ambulation/Gait Assistance: 4: Min guard;5: Supervision Ambulation Distance (Feet): 24 Feet Assistive device: Rolling walker Ambulation/Gait Assistance Details: increased difficulty this afternoon with MAX c/o pain and fatigue. Only tolerated amb from bed to bathroom then back.   RN notified for pain meds.  Assisted back to bed. Gait Pattern: Step-to pattern;Antalgic General Gait Details: decreased    Exercises Total Joint Exercises Quad Sets: AROM;Both;10 reps Heel Slides: AAROM;Right;10 reps Hip ABduction/ADduction: AROM;AAROM;Right;10 reps Straight Leg Raises: AROM;AAROM;10 reps;Right   PT Goals Acute Rehab PT Goals Time For Goal Achievement: 08/11/12 Potential to Achieve Goals: Good Pt will go Supine/Side to Sit: with supervision PT Goal: Supine/Side to Sit - Progress: Progressing toward goal Pt will go Sit to Stand: with supervision;with upper extremity assist PT Goal: Sit to Stand - Progress: Progressing toward goal Pt will go Stand to Sit: with supervision;with upper extremity assist PT Goal: Stand to Sit - Progress: Progressing toward goal Pt will Ambulate: 51 - 150 feet;with supervision;with rolling walker PT Goal: Ambulate - Progress: Progressing toward goal Pt will Go Up / Down Stairs: 1-2 stairs;with min assist;with least restrictive assistive device Pt will Perform Home Exercise Program: with supervision, verbal cues required/provided PT Goal: Perform Home Exercise Program - Progress: Progressing toward goal  Visit Information  Last PT Received On: 08/06/12 Assistance Needed: +1 PT/OT Co-Evaluation/Treatment: Yes    Subjective Data  Subjective: I'm hurting more this afternoon   Cognition  Cognition Arousal/Alertness: Awake/alert Behavior During Therapy: WFL for tasks assessed/performed Overall Cognitive Status: Within Functional Limits for tasks assessed    Balance     End of Session PT - End of Session Equipment Utilized During Treatment: Right knee immobilizer;Gait belt Activity Tolerance: Patient tolerated treatment well Patient left: in bed;with call bell/phone within reach CPM Right Knee CPM Right Knee: On   Felecia Shelling  PTA [facility]  Acute  Rehab Pager  [phone]  

## 2012-08-06 NOTE — Care Management Note (Signed)
    Page 1 of 2   08/06/2012     6:27:40 PM   CARE MANAGEMENT NOTE 08/06/2012  Patient:  Carly Buckley, Carly Buckley   Account Number:  [id number]  Date Initiated:  08/06/2012  Documentation initiated by:  Colleen Can  Subjective/Objective Assessment:   dx osteoarthritis right knee; total knee replacemnt.    Pre-arranged with [facility] services which will start day after discharge.     Action/Plan:   CM spoke with patient. Plans are for patient to return to her home in [place] where family member will be caregiver. She already has DME which was delivered to her room. Genevieve Norlander will provide [facility] services.   Anticipated DC Date:  08/07/2012   Anticipated DC Plan:  HOME W HOME HEALTH SERVICES      DC Planning Services  CM consult      PAC Choice  DURABLE MEDICAL EQUIPMENT  HOME HEALTH   Choice offered to / List presented to:  C-1 Patient   DME arranged  3-N-1  WALKER - ROLLING  TUB BENCH      DME agency  [facility]     HH arranged  HH-2 PT      [facility] agency  [facility]   Status of service:  Completed, signed off Medicare Important Message given?  NA - LOS <3 / Initial given by admissions (If response is "NO", the following Medicare IM given date fields will be blank) Date Medicare IM given:   Date Additional Medicare IM given:    Discharge Disposition:    Per UR Regulation:    If discussed at Long Length of Stay Meetings, dates discussed:    Comments:

## 2012-08-06 NOTE — Evaluation (Signed)
Occupational Therapy Evaluation and Discharge Summary Patient Details Name: Carly Buckley MRN: [id number] DOB: 01/02/1946 Today's Date: 08/06/2012 Time: 1033-1100 OT Time Calculation (min): 27 min  OT Assessment / Plan / Recommendation Clinical Impression  Pt is a 67 yo female admitted for R TKA who is doing very well with adls.  Daughter will be home with pt after d/c to assist as needed.  No further acute OT needs.  Will have HHOT visit to ensure safety with tub bench transfers as family has many questions about their particular tub bench.    OT Assessment  All further OT needs can be met in the next venue of care    Follow Up Recommendations  Home health OT    Barriers to Discharge      Equipment Recommendations  3 in 1 bedside comode;Tub/shower bench    Recommendations for Other Services    Frequency       Precautions / Restrictions Precautions Precautions: Knee Required Braces or Orthoses: Knee Immobilizer - Right Knee Immobilizer - Right: Discontinue once straight leg raise with < 10 degree lag Restrictions Weight Bearing Restrictions: No   Pertinent Vitals/Pain Pt with very little c/o pain.    ADL  Eating/Feeding: Performed;Independent Where Assessed - Eating/Feeding: Chair Grooming: Performed;Wash/dry face;Supervision/safety Where Assessed - Grooming: Supported standing Upper Body Bathing: Simulated;Set up Where Assessed - Upper Body Bathing: Unsupported sitting Lower Body Bathing: Simulated;Supervision/safety Where Assessed - Lower Body Bathing: Supported sit to stand Upper Body Dressing: Performed;Set up Where Assessed - Upper Body Dressing: Unsupported sitting Lower Body Dressing: Performed;Minimal assistance Where Assessed - Lower Body Dressing: Supported sit to stand Toilet Transfer: Research scientist (life sciences) Method: Sit to Barista: Comfort height toilet;Grab bars Toileting - Architect and Hygiene:  Performed;Supervision/safety Where Assessed - Engineer, mining and Hygiene: Sit to stand from 3-in-1 or toilet Tub/Shower Transfer: Simulated;Minimal assistance Tub/Shower Transfer Method: Stand pivot Psychologist, educational: Counsellor Used: Rolling walker;Knee Immobilizer Transfers/Ambulation Related to ADLs: Pt walked in room and into bathroom all with S. ADL Comments: Pt only needed assist getting R shoe on.  Pt could tie shoe Ily.    OT Diagnosis:    OT Problem List:   OT Treatment Interventions:     OT Goals    Visit Information  Last OT Received On: 08/06/12 Assistance Needed: +1 PT/OT Co-Evaluation/Treatment: Yes    Subjective Data  Subjective: "I am feeling better today." Patient Stated Goal: to go home   Prior Functioning     Home Living Lives With: Spouse Available Help at Discharge: Family;Available 24 hours/day Type of Home: House Home Access: Stairs to enter [employer] of Steps: 3 Entrance Stairs-Rails: Right;Left;Can reach both Home Layout: One level Bathroom Shower/Tub: Engineer, manufacturing systems: Standard Bathroom Accessibility: Yes How Accessible: Accessible via walker Home Adaptive Equipment: None Prior Function Level of Independence: Independent Able to Take Stairs?: Yes Driving: Yes Vocation: Retired Musician: No difficulties Dominant Hand: Right         Vision/Perception Vision - History Baseline Vision: Wears glasses all the time Patient Visual Report: No change from baseline Vision - Assessment Vision Assessment: Vision not tested   [employer] Arousal/Alertness: Awake/alert Behavior During Therapy: WFL for tasks assessed/performed Overall Cognitive Status: Within Functional Limits for tasks assessed    Extremity/Trunk Assessment Right Upper Extremity Assessment RUE ROM/Strength/Tone: Within functional levels RUE Sensation: WFL - Light  Touch RUE Coordination: WFL - gross/fine motor Left Upper Extremity Assessment LUE ROM/Strength/Tone:  Within functional levels LUE Sensation: WFL - Light Touch LUE Coordination: WFL - gross/fine motor Trunk Assessment Trunk Assessment: Normal     Mobility Transfers Transfers: Sit to Stand;Stand to Sit Sit to Stand: 5: Supervision;With armrests;From chair/3-in-1 Stand to Sit: 5: Supervision;To toilet;With armrests Details for Transfer Assistance: Cues to reach back before sitting and not to "plop" back in chair.  Cues to push up from surface she is leaving to stand.  Pt has lift chair at home and has become somewhat dependent on that to get up and down.  Spoke with daughter and pt about possibly trying to go w/o the lift chair as pt certainly is able to. This might help her long term strength.     Exercise     Balance Balance Balance Assessed: No   End of Session OT - End of Session Equipment Utilized During Treatment: Right knee immobilizer Activity Tolerance: Patient tolerated treatment well Patient left: in chair;with call bell/phone within reach;with family/visitor present Nurse Communication: Mobility status  GO     Hope Budds 08/06/2012, 11:44 AM [phone]

## 2012-08-06 NOTE — Progress Notes (Signed)
Physical Therapy Treatment Patient Details Name: Carly Buckley MRN: [id number] DOB: 10-19-1945 Today's Date: 08/06/2012 Time: 1030-1059 PT Time Calculation (min): 29 min  PT Assessment / Plan / Recommendation Comments on Treatment Session  pt progressing although slowly somewhat;  Nausea better, good family support; Pt ready for D/C form PT standpoint after stair training this pm    Follow Up Recommendations  Home health PT     Does the patient have the potential to tolerate intense rehabilitation     Barriers to Discharge        Equipment Recommendations  Rolling walker with 5" wheels    Recommendations for Other Services    Frequency 7X/week   Plan Discharge plan remains appropriate;Frequency remains appropriate    Precautions / Restrictions Precautions Precautions: Knee Precaution Comments: N/V Required Braces or Orthoses: Knee Immobilizer - Right Knee Immobilizer - Right: Discontinue once straight leg raise with < 10 degree lag Restrictions Weight Bearing Restrictions: No   Pertinent Vitals/Pain     Mobility  Bed Mobility Bed Mobility: Supine to Sit Supine to Sit: 4: Min guard;4: Min assist Sitting - Scoot to [place] of Bed: 4: Min assist;4: Min guard Details for Bed Mobility Assistance: assist with RLE, cues for self assist Transfers Transfers: Sit to Stand;Stand to Sit Sit to Stand: 4: Min guard;5: Supervision;From bed;From chair/3-in-1;With upper extremity assist Stand to Sit: 4: Min guard;4: Min assist;To chair/3-in-1 Details for Transfer Assistance: Cues to reach back before sitting and not to "plop" back in chair. Cues to push up from surface she is leaving to stand. Pt has lift chair at home and has become somewhat dependent on that to get up and down. Spoke with daughter and pt about possibly trying to go w/o the lift chair as pt certainly is able to. This might help her long term strength. Ambulation/Gait Ambulation/Gait Assistance: 4: Min guard;5:  Supervision Ambulation Distance (Feet): 50 Feet Assistive device: Rolling walker Ambulation/Gait Assistance Details: verbal cues for wt shift to UEs for pain control/RLE;  Gait Pattern: Step-to pattern;Antalgic General Gait Details: decreased    Exercises Total Joint Exercises Quad Sets: AROM;Both;10 reps Heel Slides: AAROM;Right;10 reps Hip ABduction/ADduction: AROM;AAROM;Right;10 reps Straight Leg Raises: AROM;AAROM;10 reps;Right   PT Diagnosis:    PT Problem List:   PT Treatment Interventions:     PT Goals Acute Rehab PT Goals Time For Goal Achievement: 08/11/12 Potential to Achieve Goals: Good Pt will go Supine/Side to Sit: with supervision PT Goal: Supine/Side to Sit - Progress: Progressing toward goal Pt will go Sit to Stand: with supervision;with upper extremity assist PT Goal: Sit to Stand - Progress: Progressing toward goal Pt will go Stand to Sit: with supervision;with upper extremity assist PT Goal: Stand to Sit - Progress: Progressing toward goal Pt will Ambulate: 51 - 150 feet;with supervision;with rolling walker PT Goal: Ambulate - Progress: Progressing toward goal Pt will Go Up / Down Stairs: 1-2 stairs;with min assist;with least restrictive assistive device Pt will Perform Home Exercise Program: with supervision, verbal cues required/provided PT Goal: Perform Home Exercise Program - Progress: Progressing toward goal  Visit Information  Last PT Received On: 08/06/12 Assistance Needed: +1 PT/OT Co-Evaluation/Treatment: Yes    Subjective Data      Cognition  Cognition Arousal/Alertness: Awake/alert Behavior During Therapy: WFL for tasks assessed/performed Overall Cognitive Status: Within Functional Limits for tasks assessed    Balance  Balance Balance Assessed: No  End of Session PT - End of Session Equipment Utilized During Treatment: Right knee immobilizer Activity  Tolerance: Patient tolerated treatment well Patient left: in chair;with call  bell/phone within reach;with family/visitor present   GP     [facility] 08/06/2012, 1:33 PM

## 2012-08-07 LAB — CBC
Hemoglobin: 9.3 g/dL — ABNORMAL LOW (ref 12.0–15.0)
MCHC: 34.2 g/dL (ref 30.0–36.0)
RDW: 13.9 % (ref 11.5–15.5)
WBC: 8.7 10*3/uL (ref 4.0–10.5)

## 2012-08-07 LAB — BASIC METABOLIC PANEL
Chloride: 95 mEq/L — ABNORMAL LOW (ref 96–112)
GFR calc Af Amer: >90 mL/min (ref >90)
GFR calc non Af Amer: >90 mL/min (ref >90)
Glucose, Bld: 176 mg/dL — ABNORMAL HIGH (ref 70–99)
Potassium: 4.2 mEq/L (ref 3.5–5.1)
Sodium: 133 mEq/L — ABNORMAL LOW (ref 135–145)

## 2012-08-07 NOTE — Progress Notes (Signed)
Physical Therapy Treatment Patient Details Name: Carly Buckley MRN: [id number] DOB: 1945-07-03 Today's Date: 08/07/2012 Time: 4098-1191 PT Time Calculation (min): 32 min  PT Assessment / Plan / Recommendation Comments on Treatment Session  Pt and family req'd a lot of VC's for proper stair and RW technique,as well as with proper use of KI as pt was sitting in the chair w/o it upon PT's arrival.    Follow Up Recommendations  Home health PT     Equipment Recommendations  Rolling walker with 5" wheels    Frequency 7X/week   Plan Discharge plan remains appropriate;Frequency remains appropriate    Precautions / Restrictions Precautions Precautions: Knee Required Braces or Orthoses: Knee Immobilizer - Right Knee Immobilizer - Right: Discontinue once straight leg raise with < 10 degree lag Restrictions Weight Bearing Restrictions: No   Pertinent Vitals/Pain Pt rated her R knee pain as 4/10 before treatment and 6/10 after. Ice was applied and her position was changed.    Mobility  Bed Mobility Bed Mobility: Sit to Supine Sit to Supine: 4: Min assist Details for Bed Mobility Assistance: min assist to support R LE on bed  Transfers Transfers: Sit to Stand;Stand to Sit Sit to Stand: 3: Mod assist;From chair/3-in-1 Sit to Stand: Patient Percentage: 60% Stand to Sit: 4: Min assist;To bed Details for Transfer Assistance: Assistance req'd to complete sit-to-stand, 25% VC's for proper hand placement. Ambulation/Gait Ambulation/Gait Assistance: 4: Min assist Ambulation/Gait: Patient Percentage: 70% Ambulation Distance (Feet): 30 Feet Assistive device: Rolling walker Ambulation/Gait Assistance Details: Pt req'd 50% VC's for proper sequencing and RW advancement. Gait Pattern: Step-to pattern;Antalgic;Decreased stance time - right Gait velocity: decreased Stairs: Yes Stairs Assistance: 4: Min assist Stairs Assistance Details (indicate cue type and reason): Pt and family needed 50% VC's  for proper assistance techniques and sequencing. Stair Management Technique: Two rails;Forwards Number of Stairs: 3    Exercises Total Joint Exercises Ankle Circles/Pumps: AROM;Both;10 reps;Supine Quad Sets: AROM;Both;10 reps;Supine Towel Squeeze: AROM;Both;10 reps;Supine Heel Slides: AAROM;Right;10 reps;Supine Hip ABduction/ADduction: AROM;Right;10 reps;Supine Straight Leg Raises: AAROM;Right;10 reps;Supine     PT Goals Acute Rehab PT Goals PT Goal Formulation: With patient/family Time For Goal Achievement: 08/11/12 Potential to Achieve Goals: Good Pt will go Sit to Supine/Side: with supervision PT Goal: Sit to Supine/Side - Progress: Progressing toward goal Pt will go Sit to Stand: with supervision;with upper extremity assist PT Goal: Sit to Stand - Progress: Progressing toward goal Pt will go Stand to Sit: with supervision;with upper extremity assist PT Goal: Stand to Sit - Progress: Progressing toward goal Pt will Ambulate: 51 - 150 feet;with supervision;with rolling walker PT Goal: Ambulate - Progress: Progressing toward goal Pt will Go Up / Down Stairs: 1-2 stairs;with min assist;with least restrictive assistive device PT Goal: Up/Down Stairs - Progress: Met Pt will Perform Home Exercise Program: with supervision, verbal cues required/provided PT Goal: Perform Home Exercise Program - Progress: Progressing toward goal  Visit Information  Last PT Received On: 08/07/12 Assistance Needed: +1    Cognition    Good   Balance   Fair +  End of Session PT - End of Session Equipment Utilized During Treatment: Right knee immobilizer;Gait belt Activity Tolerance: Patient tolerated treatment well Patient left: in bed;with call bell/phone within reach;with family/visitor present   GP     BROWN-SMEDLEY, NICOLE 08/07/2012, 1:32 PM   Felecia Shelling  PTA [facility]  Acute  Rehab Pager      [phone]

## 2012-08-07 NOTE — Progress Notes (Signed)
   Subjective: 3 Days Post-Op Procedure(s) (LRB): TOTAL KNEE ARTHROPLASTY (Right) STEROID INJECTION (Left) Patient reports pain as mild.   Patient seen in rounds with Dr. Lequita Halt. Patient is well, and has had no acute complaints or problems. She is feeling a little more confident today. Doing better on Hydrocodone. No issues overnight. No complaint of chest pain or shortness of breath.  Plan is to go Home after hospital stay.  Objective: Vital signs in last 24 hours: Temp:  [97.9 F (36.6 C)-98.8 F (37.1 C)] 98.2 F (36.8 C) (04/24 0415) Pulse Rate:  [76-112] 112 (04/24 0415) Resp:  [15-20] 20 (04/24 0415) BP: (126-139)/(63-71) 127/69 mmHg (04/24 0415) SpO2:  [90 %-97 %] 93 % (04/24 0415)  Intake/Output from previous day:  Intake/Output Summary (Last 24 hours) at 08/07/12 0707 Last data filed at 08/07/12 0415  Gross per 24 hour  Intake    720 ml  Output   2375 ml  Net  -1655 ml    Intake/Output this shift:    Labs:  Recent Labs  08/05/12 0442 08/06/12 0432 08/07/12 0434  HGB 8.9* 9.0* 9.3*    Recent Labs  08/06/12 0432 08/07/12 0434  WBC 8.6 8.7  RBC 2.96* 3.03*  HCT 26.5* 27.2*  PLT 162 193    Recent Labs  08/06/12 0432 08/07/12 0434  NA 127* 133*  K 4.4 4.2  CL 92* 95*  CO2 27 26  BUN 15 19  CREATININE 0.56 0.58  GLUCOSE 206* 176*  CALCIUM 8.9 9.3    EXAM General - Patient is Alert and Oriented Extremity - Neurologically intact Dorsiflexion/Plantar flexion intact Dressing/Incision - clean, dry, no drainage Motor Function - intact, moving foot and toes well on exam.   Past Medical History  Diagnosis Date  . Colon cancer     colon ca dx 07  . Diabetes mellitus   . Hypertension   . Malignant neoplasm of colon   . Neuropathy, idiopathic   . Type II diabetes mellitus without mention of complication   . Helicobacter pylori (H. pylori)   . Diverticulitis   . Colon cancer 2008  . Arthritis   . History of colon cancer 2007    colon  resection  . Cough 07/24/12    productive, ? color, no fever, congested   . Dysrhythmia     palpitations  . Complication of anesthesia   . PONV (postoperative nausea and vomiting)     Assessment/Plan: 3 Days Post-Op Procedure(s) (LRB): TOTAL KNEE ARTHROPLASTY (Right) STEROID INJECTION (Left) Principal Problem:   OA (osteoarthritis) of knee Active Problems:   Postoperative anemia due to acute blood loss   Hyponatremia  Estimated body mass index is 31.97 kg/(m^2) as calculated from the following:   Height as of this encounter: 5\' 6"  (1.676 m).   Weight as of this encounter: 89.812 kg (198 lb). Advance diet Up with therapy D/C IV fluids Discharge home with home health  DVT Prophylaxis - Xarelto Weight-Bearing as tolerated   Will plan for discharge today and follow up in office in 2 weeks. Continue on Hydrocodone, Xarelto, Robaxin, and Tramadol  Carly Buckley Carly Buckley 08/07/2012, 7:07 AM

## 2012-08-07 NOTE — Progress Notes (Signed)
Pt stable, scripts, and d/c instructions given with no questions/concerns voiced by pt or family.  Pt set up with [facility] and all equipment already home per daughter.  Pt transported via wheelchair to private vehicle with NT and family.

## 2012-08-10 ENCOUNTER — Other Ambulatory Visit: Payer: Self-pay | Admitting: Family Medicine

## 2012-08-11 ENCOUNTER — Other Ambulatory Visit: Payer: Self-pay | Admitting: Family Medicine

## 2012-08-11 NOTE — Telephone Encounter (Signed)
Pharmacy faxed needing Dx:250.00, and how many times the pt will be checking her sugars:Testing 1x/day.  Pt needs OV.  Faxed the information to the pharmacy([facility]).  Refill has been sent to the pharmacy.//AB/CMA

## 2012-08-11 NOTE — Telephone Encounter (Signed)
Med filled.  

## 2012-08-12 ENCOUNTER — Other Ambulatory Visit: Payer: Self-pay | Admitting: Family Medicine

## 2012-08-18 ENCOUNTER — Telehealth: Payer: Self-pay

## 2012-08-18 NOTE — Telephone Encounter (Addendum)
Call from [place]/OT with Genev[facility] Norlander and she is concerned that the patient's blood sugars are elevated, BS was 280 after breakfast and is now 294 and she can not perform OT with a BS that high. Patient is currently taking Metformin 1000 daily with meals 500 mg after breakfast, glimepiride 4 mg daily. Per Dr.Tabori patient will need to be seen to make necessary adjustments. Apt scheduled for tomorrow.      KP

## 2012-08-19 ENCOUNTER — Encounter: Payer: Self-pay | Admitting: Family Medicine

## 2012-08-19 ENCOUNTER — Ambulatory Visit (INDEPENDENT_AMBULATORY_CARE_PROVIDER_SITE_OTHER): Payer: Medicare Other | Admitting: Family Medicine

## 2012-08-19 VITALS — BP 130/70 | HR 102 | Temp 98.1°F | Ht 65.25 in | Wt 194.8 lb

## 2012-08-19 DIAGNOSIS — E119 Type 2 diabetes mellitus without complications: Secondary | ICD-10-CM

## 2012-08-19 DIAGNOSIS — G4700 Insomnia, unspecified: Secondary | ICD-10-CM

## 2012-08-19 DIAGNOSIS — R000 Tachycardia, unspecified: Secondary | ICD-10-CM | POA: Insufficient documentation

## 2012-08-19 DIAGNOSIS — I10 Essential (primary) hypertension: Secondary | ICD-10-CM

## 2012-08-19 HISTORY — DX: Tachycardia, unspecified: R00.0

## 2012-08-19 LAB — BASIC METABOLIC PANEL
BUN: 25 mg/dL — ABNORMAL HIGH (ref 6–23)
Calcium: 10 mg/dL (ref 8.4–10.5)
Creatinine, Ser: 0.8 mg/dL (ref 0.4–1.2)
GFR: 76.11 mL/min (ref >60.00)

## 2012-08-19 LAB — CBC WITH DIFFERENTIAL/PLATELET
Basophils Absolute: 0.1 10*3/uL (ref 0.0–0.1)
Eosinophils Absolute: 0.3 10*3/uL (ref 0.0–0.7)
Lymphocytes Relative: 16.5 % (ref 12.0–46.0)
MCHC: 33 g/dL (ref 30.0–36.0)
Neutro Abs: 7.5 10*3/uL (ref 1.4–7.7)
Neutrophils Relative %: 75.9 % (ref 43.0–77.0)
Platelets: 460 10*3/uL — ABNORMAL HIGH (ref 150.0–400.0)
RDW: 16.3 % — ABNORMAL HIGH (ref 11.5–14.6)

## 2012-08-19 LAB — HEMOGLOBIN A1C: Hgb A1c MFr Bld: 7.7 % — ABNORMAL HIGH (ref 4.6–6.5)

## 2012-08-19 MED ORDER — SITAGLIPTIN PHOSPHATE 100 MG PO TABS: 100.0000 mg | ORAL_TABLET | Freq: Every day | ORAL | Status: DC

## 2012-08-19 MED ORDER — GLIMEPIRIDE 4 MG PO TABS: ORAL_TABLET | ORAL | Status: DC

## 2012-08-19 MED ORDER — TRAZODONE HCL 50 MG PO TABS: 25.0000 mg | ORAL_TABLET | Freq: Every evening | ORAL | Status: DC | PRN

## 2012-08-19 NOTE — Discharge Summary (Signed)
Physician Discharge Summary   Patient ID: Carly Buckley MRN: [id number] DOB/AGE: September 24, 1945 67 y.o.  Admit date: 08/04/2012 Discharge date: 08/07/2012  Primary Diagnosis:  Osteoarthritis Bilateral knee  Admission Diagnoses:  Past Medical History  Diagnosis Date  . Colon cancer     colon ca dx 07  . Diabetes mellitus   . Hypertension   . Malignant neoplasm of colon   . Neuropathy, idiopathic   . Type II diabetes mellitus without mention of complication   . Helicobacter pylori (H. pylori)   . Diverticulitis   . Colon cancer 2008  . Arthritis   . History of colon cancer 2007    colon resection  . Cough 07/24/12    productive, ? color, no fever, congested   . Dysrhythmia     palpitations  . Complication of anesthesia   . PONV (postoperative nausea and vomiting)    Discharge Diagnoses:   Principal Problem:   OA (osteoarthritis) of knee Active Problems:   Postoperative anemia due to acute blood loss   Hyponatremia  Estimated body mass index is 31.97 kg/(m^2) as calculated from the following:   Height as of this encounter: 5\' 6"  (1.676 m).   Weight as of this encounter: 89.812 kg (198 lb).  Procedure:  Procedure(s) (LRB): TOTAL KNEE ARTHROPLASTY (Right) STEROID INJECTION (Left)   Consults: None  HPI: Carly Buckley is a 67 y.o. year old female with end stage OA of her right knee with progressively worsening pain and dysfunction. She has constant pain, with activity and at rest and significant functional deficits with difficulties even with ADLs. She has had extensive non-op management including analgesics, injections of cortisone and viscosupplements, and home exercise program, but remains in significant pain with significant dysfunction.Radiographs show bone on bone arthritis medial compartment. She also has significant left knee OA and pain and requests a cortisone injection left knee. She presents now for right Total Knee Arthroplasty and cortisone injection left  knee.  Laboratory Data: Admission on 08/04/2012, Discharged on 08/07/2012  Component Date Value Range Status  . Glucose-Capillary 08/04/2012 194* 70 - 99 mg/dL Final  . Comment 1 78/29/5621 Notify RN   Final  . Glucose-Capillary 08/04/2012 170* 70 - 99 mg/dL Final  . WBC 30/86/5784 7.1  4.0 - 10.5 K/uL Final  . RBC 08/05/2012 2.91* 3.87 - 5.11 MIL/uL Final  . Hemoglobin 08/05/2012 8.9* 12.0 - 15.0 g/dL Final  . HCT 69/62/9528 26.5* 36.0 - 46.0 % Final  . MCV 08/05/2012 91.1  78.0 - 100.0 fL Final  . MCH 08/05/2012 30.6  26.0 - 34.0 pg Final  . MCHC 08/05/2012 33.6  30.0 - 36.0 g/dL Final  . RDW 41/32/4401 14.0  11.5 - 15.5 % Final  . Platelets 08/05/2012 140* 150 - 400 K/uL Final  . Sodium 08/05/2012 128* 135 - 145 mEq/L Final  . Potassium 08/05/2012 4.3  3.5 - 5.1 mEq/L Final  . Chloride 08/05/2012 94* 96 - 112 mEq/L Final  . CO2 08/05/2012 28  19 - 32 mEq/L Final  . Glucose, Bld 08/05/2012 208* 70 - 99 mg/dL Final  . BUN 02/72/5366 20  6 - 23 mg/dL Final  . Creatinine, Ser 08/05/2012 0.63  0.50 - 1.10 mg/dL Final  . Calcium 44/06/4740 8.3* 8.4 - 10.5 mg/dL Final  . GFR calc non Af Amer 08/05/2012 >90  >90 mL/min Final  . GFR calc Af Amer 08/05/2012 >90  >90 mL/min Final   Comment:  The eGFR has been calculated                          using the CKD EPI equation.                          This calculation has not been                          validated in all clinical                          situations.                          eGFR's persistently                          <90 mL/min signify                          possible Chronic Kidney Disease.  . Glucose-Capillary 08/04/2012 251* 70 - 99 mg/dL Final  . Glucose-Capillary 08/04/2012 256* 70 - 99 mg/dL Final  . Glucose-Capillary 08/05/2012 212* 70 - 99 mg/dL Final  . WBC 19/14/7829 8.6  4.0 - 10.5 K/uL Final  . RBC 08/06/2012 2.96* 3.87 - 5.11 MIL/uL Final  . Hemoglobin 08/06/2012 9.0* 12.0 -  15.0 g/dL Final  . HCT 56/21/3086 26.5* 36.0 - 46.0 % Final  . MCV 08/06/2012 89.5  78.0 - 100.0 fL Final  . MCH 08/06/2012 30.4  26.0 - 34.0 pg Final  . MCHC 08/06/2012 34.0  30.0 - 36.0 g/dL Final  . RDW 57/84/6962 13.9  11.5 - 15.5 % Final  . Platelets 08/06/2012 162  150 - 400 K/uL Final  . Sodium 08/06/2012 127* 135 - 145 mEq/L Final  . Potassium 08/06/2012 4.4  3.5 - 5.1 mEq/L Final  . Chloride 08/06/2012 92* 96 - 112 mEq/L Final  . CO2 08/06/2012 27  19 - 32 mEq/L Final  . Glucose, Bld 08/06/2012 206* 70 - 99 mg/dL Final  . BUN 95/28/4132 15  6 - 23 mg/dL Final  . Creatinine, Ser 08/06/2012 0.56  0.50 - 1.10 mg/dL Final  . Calcium 44/04/270 8.9  8.4 - 10.5 mg/dL Final  . GFR calc non Af Amer 08/06/2012 >90  >90 mL/min Final  . GFR calc Af Amer 08/06/2012 >90  >90 mL/min Final   Comment:                                 The eGFR has been calculated                          using the CKD EPI equation.                          This calculation has not been                          validated in all clinical                          situations.  eGFR's persistently                          <90 mL/min signify                          possible Chronic Kidney Disease.  . Glucose-Capillary 08/05/2012 210* 70 - 99 mg/dL Final  . Glucose-Capillary 08/05/2012 148* 70 - 99 mg/dL Final  . Comment 1 16/01/9603 Notify RN   Final  . Glucose-Capillary 08/06/2012 200* 70 - 99 mg/dL Final  . Comment 1 54/12/8117 Notify RN   Final  . Glucose-Capillary 08/06/2012 182* 70 - 99 mg/dL Final  . Glucose-Capillary 08/06/2012 194* 70 - 99 mg/dL Final  . Comment 1 14/78/2956 Notify RN   Final  . WBC 08/07/2012 8.7  4.0 - 10.5 K/uL Final  . RBC 08/07/2012 3.03* 3.87 - 5.11 MIL/uL Final  . Hemoglobin 08/07/2012 9.3* 12.0 - 15.0 g/dL Final  . HCT 21/30/8657 27.2* 36.0 - 46.0 % Final  . MCV 08/07/2012 89.8  78.0 - 100.0 fL Final  . MCH 08/07/2012 30.7  26.0 - 34.0 pg Final  .  MCHC 08/07/2012 34.2  30.0 - 36.0 g/dL Final  . RDW 84/69/6295 13.9  11.5 - 15.5 % Final  . Platelets 08/07/2012 193  150 - 400 K/uL Final  . Sodium 08/07/2012 133* 135 - 145 mEq/L Final  . Potassium 08/07/2012 4.2  3.5 - 5.1 mEq/L Final  . Chloride 08/07/2012 95* 96 - 112 mEq/L Final  . CO2 08/07/2012 26  19 - 32 mEq/L Final  . Glucose, Bld 08/07/2012 176* 70 - 99 mg/dL Final  . BUN 28/41/3244 19  6 - 23 mg/dL Final  . Creatinine, Ser 08/07/2012 0.58  0.50 - 1.10 mg/dL Final  . Calcium 04/18/7251 9.3  8.4 - 10.5 mg/dL Final  . GFR calc non Af Amer 08/07/2012 >90  >90 mL/min Final  . GFR calc Af Amer 08/07/2012 >90  >90 mL/min Final   Comment:                                 The eGFR has been calculated                          using the CKD EPI equation.                          This calculation has not been                          validated in all clinical                          situations.                          eGFR's persistently                          <90 mL/min signify                          possible Chronic Kidney Disease.  . Glucose-Capillary 08/06/2012 212* 70 - 99 mg/dL Final  . Comment  1 08/06/2012 Notify RN   Final  . Comment 2 08/06/2012 Documented in Chart   Final  . Glucose-Capillary 08/07/2012 183* 70 - 99 mg/dL Final  Hospital Outpatient Visit on 07/28/2012  Component Date Value Range Status  . aPTT 07/28/2012 26  24 - 37 seconds Final  . Sodium 07/28/2012 141  135 - 145 mEq/L Final  . Potassium 07/28/2012 4.4  3.5 - 5.1 mEq/L Final  . Chloride 07/28/2012 99  96 - 112 mEq/L Final  . CO2 07/28/2012 29  19 - 32 mEq/L Final  . Glucose, Bld 07/28/2012 154* 70 - 99 mg/dL Final  . BUN 45/40/9811 17  6 - 23 mg/dL Final  . Creatinine, Ser 07/28/2012 0.58  0.50 - 1.10 mg/dL Final  . Calcium 91/47/8295 11.3* 8.4 - 10.5 mg/dL Final  . Total Protein 07/28/2012 8.2  6.0 - 8.3 g/dL Final  . Albumin 62/13/0865 4.4  3.5 - 5.2 g/dL Final  . AST 78/46/9629 22  0 - 37  U/L Final  . ALT 07/28/2012 34  0 - 35 U/L Final  . Alkaline Phosphatase 07/28/2012 78  39 - 117 U/L Final  . Total Bilirubin 07/28/2012 0.3  0.3 - 1.2 mg/dL Final  . GFR calc non Af Amer 07/28/2012 >90  >90 mL/min Final  . GFR calc Af Amer 07/28/2012 >90  >90 mL/min Final   Comment:                                 The eGFR has been calculated                          using the CKD EPI equation.                          This calculation has not been                          validated in all clinical                          situations.                          eGFR's persistently                          <90 mL/min signify                          possible Chronic Kidney Disease.  Marland Kitchen Prothrombin Time 07/28/2012 12.6  11.6 - 15.2 seconds Final  . INR 07/28/2012 0.95  0.00 - 1.49 Final  . ABO/RH(D) 07/28/2012 A POS   Final  . Antibody Screen 07/28/2012 NEG   Final  . Sample Expiration 07/28/2012 08/07/2012   Final  . Color, Urine 07/28/2012 YELLOW  YELLOW Final  . APPearance 07/28/2012 CLEAR  CLEAR Final  . Specific Gravity, Urine 07/28/2012 1.014  1.005 - 1.030 Final  . pH 07/28/2012 5.0  5.0 - 8.0 Final  . Glucose, UA 07/28/2012 NEGATIVE  NEGATIVE mg/dL Final  . Hgb urine dipstick 07/28/2012 NEGATIVE  NEGATIVE Final  . Bilirubin Urine 07/28/2012 NEGATIVE  NEGATIVE Final  . Ketones, ur 07/28/2012 NEGATIVE  NEGATIVE mg/dL Final  .  Protein, ur 07/28/2012 NEGATIVE  NEGATIVE mg/dL Final  . Urobilinogen, UA 07/28/2012 0.2  0.0 - 1.0 mg/dL Final  . Nitrite 82/95/6213 NEGATIVE  NEGATIVE Final  . Leukocytes, UA 07/28/2012 MODERATE* NEGATIVE Final  . MRSA, PCR 07/28/2012 NEGATIVE  NEGATIVE Final  . Staphylococcus aureus 07/28/2012 NEGATIVE  NEGATIVE Final   Comment:                                 The Xpert SA Assay (FDA                          approved for NASAL specimens                          in patients over 22 years of age),                          is one component of                           a comprehensive surveillance                          program.  Test performance has                          been validated by [employer] for patients greater                          than or equal to 53 year old.                          It is not intended                          to diagnose infection nor to                          guide or monitor treatment.  . WBC 07/28/2012 6.0  4.0 - 10.5 K/uL Final  . RBC 07/28/2012 4.31  3.87 - 5.11 MIL/uL Final  . Hemoglobin 07/28/2012 13.1  12.0 - 15.0 g/dL Final  . HCT 08/65/7846 39.6  36.0 - 46.0 % Final  . MCV 07/28/2012 91.9  78.0 - 100.0 fL Final  . MCH 07/28/2012 30.4  26.0 - 34.0 pg Final  . MCHC 07/28/2012 33.1  30.0 - 36.0 g/dL Final  . RDW 96/29/5284 13.9  11.5 - 15.5 % Final  . Platelets 07/28/2012 190  150 - 400 K/uL Final  . Specimen Description 07/28/2012 URINE, CLEAN CATCH   Final  . Special Requests 07/28/2012 NONE   Final  . Culture  Setup Time 07/28/2012 07/28/2012 22:03   Final  . Colony Count 07/28/2012 NO GROWTH   Final  . Culture 07/28/2012 NO GROWTH   Final  . Report Status 07/28/2012 07/29/2012 FINAL   Final  . Squamous Epithelial / LPF 07/28/2012 FEW* RARE Final  . WBC, UA 07/28/2012 11-20  <3 WBC/hpf  Final  . Bacteria, UA 07/28/2012 FEW* RARE Final  Appointment on 07/24/2012  Component Date Value Range Status  . WBC 07/24/2012 7.3  3.9 - 10.3 10e3/uL Final  . NEUT# 07/24/2012 5.2  1.5 - 6.5 10e3/uL Final  . HGB 07/24/2012 12.0  11.6 - 15.9 g/dL Final  . HCT 28/41/3244 36.4  34.8 - 46.6 % Final  . Platelets 07/24/2012 171  145 - 400 10e3/uL Final  . MCV 07/24/2012 91.9  79.5 - 101.0 fL Final  . MCH 07/24/2012 30.4  25.1 - 34.0 pg Final  . MCHC 07/24/2012 33.0  31.5 - 36.0 g/dL Final  . RBC 04/18/7251 3.96  3.70 - 5.45 10e6/uL Final  . RDW 07/24/2012 14.9* 11.2 - 14.5 % Final  . lymph# 07/24/2012 1.3  0.9 - 3.3 10e3/uL Final  . MONO# 07/24/2012 0.4  0.1 - 0.9 10e3/uL  Final  . Eosinophils Absolute 07/24/2012 0.3  0.0 - 0.5 10e3/uL Final  . Basophils Absolute 07/24/2012 0.0  0.0 - 0.1 10e3/uL Final  . NEUT% 07/24/2012 71.8  38.4 - 76.8 % Final  . LYMPH% 07/24/2012 18.5  14.0 - 49.7 % Final  . MONO% 07/24/2012 5.4  0.0 - 14.0 % Final  . EOS% 07/24/2012 3.8  0.0 - 7.0 % Final  . BASO% 07/24/2012 0.5  0.0 - 2.0 % Final  . CEA 07/24/2012 2.4  0.0 - 5.0 ng/mL Final  . Sodium 07/24/2012 138  136 - 145 mEq/L Final  . Potassium 07/24/2012 4.2  3.5 - 5.1 mEq/L Final  . Chloride 07/24/2012 102  98 - 107 mEq/L Final  . CO2 07/24/2012 24  22 - 29 mEq/L Final  . Glucose 07/24/2012 244* 70 - 99 mg/dl Final  . BUN 66/44/0347 17.6  7.0 - 26.0 mg/dL Final  . Creatinine 42/59/5638 0.8  0.6 - 1.1 mg/dL Final  . Total Bilirubin 07/24/2012 0.37  0.20 - 1.20 mg/dL Final  . Alkaline Phosphatase 07/24/2012 80  40 - 150 U/L Final  . AST 07/24/2012 17  5 - 34 U/L Final  . ALT 07/24/2012 30  0 - 55 U/L Final  . Total Protein 07/24/2012 7.5  6.4 - 8.3 g/dL Final  . Albumin 75/64/3329 3.8  3.5 - 5.0 g/dL Final  . Calcium 51/88/4166 9.8  8.4 - 10.4 mg/dL Final  Hospital Outpatient Visit on 06/18/2012  Component Date Value Range Status  . MRSA, PCR 06/18/2012 NEGATIVE  NEGATIVE Final  . Staphylococcus aureus 06/18/2012 NEGATIVE  NEGATIVE Final   Comment:                                 The Xpert SA Assay (FDA                          approved for NASAL specimens                          in patients over 10 years of age),                          is one component of                          a comprehensive surveillance  program.  Test performance has                          been validated by [facility] for patients greater                          than or equal to 16 year old.                          It is not intended                          to diagnose infection nor to                          guide or monitor  treatment.  Marland Kitchen aPTT 06/18/2012 28  24 - 37 seconds Final  . WBC 06/18/2012 7.6  4.0 - 10.5 K/uL Final  . RBC 06/18/2012 4.08  3.87 - 5.11 MIL/uL Final  . Hemoglobin 06/18/2012 12.3  12.0 - 15.0 g/dL Final  . HCT 16/01/9603 37.8  36.0 - 46.0 % Final  . MCV 06/18/2012 92.6  78.0 - 100.0 fL Final  . MCH 06/18/2012 30.1  26.0 - 34.0 pg Final  . MCHC 06/18/2012 32.5  30.0 - 36.0 g/dL Final  . RDW 54/12/8117 14.3  11.5 - 15.5 % Final  . Platelets 06/18/2012 194  150 - 400 K/uL Final  . Sodium 06/18/2012 135  135 - 145 mEq/L Final  . Potassium 06/18/2012 4.3  3.5 - 5.1 mEq/L Final  . Chloride 06/18/2012 97  96 - 112 mEq/L Final  . CO2 06/18/2012 24  19 - 32 mEq/L Final  . Glucose, Bld 06/18/2012 289* 70 - 99 mg/dL Final  . BUN 14/78/2956 27* 6 - 23 mg/dL Final  . Creatinine, Ser 06/18/2012 0.75  0.50 - 1.10 mg/dL Final  . Calcium 21/30/8657 9.8  8.4 - 10.5 mg/dL Final  . Total Protein 06/18/2012 8.0  6.0 - 8.3 g/dL Final  . Albumin 84/69/6295 4.0  3.5 - 5.2 g/dL Final  . AST 28/41/3244 21  0 - 37 U/L Final  . ALT 06/18/2012 29  0 - 35 U/L Final  . Alkaline Phosphatase 06/18/2012 78  39 - 117 U/L Final  . Total Bilirubin 06/18/2012 0.2* 0.3 - 1.2 mg/dL Final  . GFR calc non Af Amer 06/18/2012 86* >90 mL/min Final  . GFR calc Af Amer 06/18/2012 >90  >90 mL/min Final   Comment:                                 The eGFR has been calculated                          using the CKD EPI equation.                          This calculation has not been                          validated  in all clinical                          situations.                          eGFR's persistently                          <90 mL/min signify                          possible Chronic Kidney Disease.  Marland Kitchen Prothrombin Time 06/18/2012 12.8  11.6 - 15.2 seconds Final  . INR 06/18/2012 0.97  0.00 - 1.49 Final  . Color, Urine 06/18/2012 YELLOW  YELLOW Final  . APPearance 06/18/2012 CLEAR  CLEAR Final  . Specific Gravity,  Urine 06/18/2012 1.015  1.005 - 1.030 Final  . pH 06/18/2012 5.0  5.0 - 8.0 Final  . Glucose, UA 06/18/2012 250* NEGATIVE mg/dL Final  . Hgb urine dipstick 06/18/2012 NEGATIVE  NEGATIVE Final  . Bilirubin Urine 06/18/2012 NEGATIVE  NEGATIVE Final  . Ketones, ur 06/18/2012 NEGATIVE  NEGATIVE mg/dL Final  . Protein, ur 16/01/9603 NEGATIVE  NEGATIVE mg/dL Final  . Urobilinogen, UA 06/18/2012 0.2  0.0 - 1.0 mg/dL Final  . Nitrite 54/12/8117 NEGATIVE  NEGATIVE Final  . Leukocytes, UA 06/18/2012 SMALL* NEGATIVE Final  . ABO/RH(D) 06/18/2012 A POS   Final  . Antibody Screen 06/18/2012 NEG   Final  . Sample Expiration 06/18/2012 06/26/2012   Final  . ABO/RH(D) 06/18/2012 A POS   Final  . Squamous Epithelial / LPF 06/18/2012 RARE  RARE Final  . WBC, UA 06/18/2012 0-2  <3 WBC/hpf Final  . Bacteria, UA 06/18/2012 RARE  RARE Final     X-Rays:Dg Chest 2 View  07/28/2012  *RADIOLOGY REPORT*  Clinical Data: Preop knee surgery  CHEST - 2 VIEW  Comparison: 06/18/2012  Findings: Heart size is normal.  Negative for heart failure.  Lungs are clear without infiltrate or mass lesion.  No change from prior study.  IMPRESSION: No active cardiopulmonary abnormality.   Original Report Authenticated By: Janeece Riggers, M.D.     EKG: Orders placed in visit on 04/17/12  . EKG 12-LEAD  . EKG     Hospital Course: Carly Buckley is a 67 y.o. who was admitted to [facility]. They were brought to the operating room on 08/04/2012 and underwent Procedure(s): TOTAL KNEE ARTHROPLASTY STEROID INJECTION.  Patient tolerated the procedure well and was later transferred to the recovery room and then to the orthopaedic floor for postoperative care.  They were given PO and IV analgesics for pain control following their surgery.  They were given 24 hours of postoperative antibiotics of  Anti-infectives   Start     Dose/Rate Route Frequency Ordered Stop   08/04/12 1600  ceFAZolin (ANCEF) IVPB 1 g/50 mL premix     1 g 100  mL/hr over 30 Minutes Intravenous Every 6 hours 08/04/12 1214 08/04/12 2326   08/04/12 0630  ceFAZolin (ANCEF) IVPB 2 g/50 mL premix     2 g 100 mL/hr over 30 Minutes Intravenous On call to O.R. 08/04/12 1478 08/04/12 0915     and started on DVT prophylaxis in the form of Xarelto.   PT and OT were ordered for total joint protocol.  Discharge planning consulted to help with postop disposition and  equipment needs.  Patient had a tough night on the evening of surgery but felt a little better the next day.  They started to get up OOB with therapy on day one. Hemovac drain was pulled without difficulty.  Continued to work with therapy into day two despite some nausea.  Dressing was changed on day two and the incision was healing.  Oral pain pill was also changed. By day three, the patient had progressed with therapy and meeting their goals.  Incision was healing well.  Patient was seen in rounds and was ready to go home.   Discharge Medications: Prior to Admission medications   Medication Sig Start Date End Date Taking? Authorizing Provider  amLODipine (NORVASC) 10 MG tablet Take 10 mg by mouth daily before breakfast. 12/24/11  Yes Sheliah Hatch, MD  dextromethorphan-guaiFENesin (TUSSIN COUGH DM SUGAR FREE) 10-100 MG/5ML liquid Take 5 mLs by mouth every 4 (four) hours as needed for cough.   Yes Historical Provider, MD  lisinopril-hydrochlorothiazide (PRINZIDE,ZESTORETIC) 20-25 MG per tablet Take 1 tablet by mouth daily before breakfast. 01/03/12  Yes Sheliah Hatch, MD  nystatin-triamcinolone ointment [facility]) Apply 1 application topically 2 (two) times daily as needed (for rash). Apply to stomach   Yes Historical Provider, MD  Propylene Glycol (SYSTANE BALANCE) 0.6 % SOLN Place 1 drop into both eyes daily as needed (for dry eyes).   Yes Historical Provider, MD  aspirin 81 MG tablet Take 81 mg by mouth daily.    Historical Provider, MD  glimepiride (AMARYL) 4 MG tablet Take 4 mg by mouth daily  before breakfast. 04/01/12   Sheliah Hatch, MD  HYDROcodone-acetaminophen (NORCO/VICODIN) 5-325 MG per tablet Take 1-2 tablets by mouth every 4 (four) hours as needed. 08/06/12   Loanne Drilling, MD  metFORMIN (GLUCOPHAGE) 1000 MG tablet Take 1,000-1,500 mg by mouth 2 (two) times daily with a meal. Takes with 1000 mg in the morning,  500 mg lunch,  and 1000 at night    Historical Provider, MD  methocarbamol (ROBAXIN) 500 MG tablet Take 1 tablet (500 mg total) by mouth every 6 (six) hours as needed. 08/05/12   Loanne Drilling, MD  ONE TOUCH ULTRA TEST test strip USE AS DIRECTED TO TEST BLOOD SUGAR 08/11/12   Sheliah Hatch, MD  rivaroxaban (XARELTO) 10 MG TABS tablet Take 1 tablet (10 mg total) by mouth daily with breakfast. 08/05/12   Loanne Drilling, MD  traMADol (ULTRAM) 50 MG tablet Take 1-2 tablets (50-100 mg total) by mouth every 6 (six) hours as needed. 08/05/12   Loanne Drilling, MD    Diet: Cardiac diet and Diabetic diet Activity:WBAT Follow-up:in 2 weeks Disposition - Home Discharged Condition: good    Future Appointments Provider Department Dept Phone   09/05/2012 8:00 AM Sheliah Hatch, MD [facility]  [place] [phone]   07/24/2013 2:30 PM [facility] MEDICAL ONCOLOGY [phone]   07/24/2013 3:00 PM Benjiman Core, MD [facility] MEDICAL ONCOLOGY [phone]       Medication List    STOP taking these medications       aspirin 81 MG tablet     cefUROXime 500 MG tablet  Commonly known as:  CEFTIN     conjugated estrogens vaginal cream  Commonly known as:  PREMARIN     multivitamin tablet      TAKE these medications       amLODipine 10 MG tablet  Commonly known  as:  NORVASC  Take 10 mg by mouth daily before breakfast.     glimepiride 4 MG tablet  Commonly known as:  AMARYL  Take 4 mg by mouth daily before breakfast.     HYDROcodone-acetaminophen 5-325 MG per tablet  Commonly known  as:  NORCO/VICODIN  Take 1-2 tablets by mouth every 4 (four) hours as needed.     lisinopril-hydrochlorothiazide 20-25 MG per tablet  Commonly known as:  PRINZIDE,ZESTORETIC  Take 1 tablet by mouth daily before breakfast.     metFORMIN 1000 MG tablet  Commonly known as:  GLUCOPHAGE  Take 1,000-1,500 mg by mouth 2 (two) times daily with a meal. Takes with 1000 mg in the morning,  500 mg lunch,  and 1000 at night     methocarbamol 500 MG tablet  Commonly known as:  ROBAXIN  Take 1 tablet (500 mg total) by mouth every 6 (six) hours as needed.     nystatin-triamcinolone ointment  Commonly known as:  MYCOLOG  Apply 1 application topically 2 (two) times daily as needed (for rash). Apply to stomach     rivaroxaban 10 MG Tabs tablet  Commonly known as:  XARELTO  Take 1 tablet (10 mg total) by mouth daily with breakfast.     SYSTANE BALANCE 0.6 % Soln  Generic drug:  Propylene Glycol  Place 1 drop into both eyes daily as needed (for dry eyes).     traMADol 50 MG tablet  Commonly known as:  ULTRAM  Take 1-2 tablets (50-100 mg total) by mouth every 6 (six) hours as needed.     TUSSIN COUGH DM SUGAR FREE 10-100 MG/5ML liquid  Generic drug:  dextromethorphan-guaiFENesin  Take 5 mLs by mouth every 4 (four) hours as needed for cough.           Follow-up Information   Follow up with Loanne Drilling, MD. Schedule an appointment as soon as possible for a visit on 08/19/2012. (Call [phone] tomorrow to make the appointment)    Contact information:   [address] [place] [address] [phone]       Signed: Patrica Duel 08/19/2012, 9:30 AM

## 2012-08-19 NOTE — Progress Notes (Signed)
  Subjective:    Patient ID: Carly Buckley, female    DOB: Dec 14, 1945, 67 y.o.   MRN: [id number]  HPI DM- chronic problem, got call from Home [facility] yesterday that CBGs were 'too high to proceed w/ therapy'.  Yesterday AM was 293, last night 157.  Fasting this AM 178.  S/p R TKR.  On Metformin 1000, 500, 1000 TID.  Denies symptomatic lows.  + nausea 'at the thought of PT'.  HTN- chronic problem, well controlled on Lisinopril HCTZ and amlodipine.  Denies CP, SOB, HAs, visual changes, edema   Review of Systems For ROS see HPI     Objective:   Physical Exam  Vitals reviewed. Constitutional: She is oriented to person, place, and time. She appears well-developed and well-nourished. No distress.  HENT:  Head: Normocephalic and atraumatic.  Eyes: Conjunctivae and EOM are normal. Pupils are equal, round, and reactive to light.  Neck: Normal range of motion. Neck supple. No thyromegaly present.  Cardiovascular: Regular rhythm, normal heart sounds and intact distal pulses.   No murmur heard. Tachy but regular  Pulmonary/Chest: Effort normal and breath sounds normal. No respiratory distress.  Abdominal: Soft. She exhibits no distension. There is no tenderness.  Musculoskeletal: She exhibits no edema.  Lymphadenopathy:    She has no cervical adenopathy.  Neurological: She is alert and oriented to person, place, and time.  Skin: Skin is warm and dry.  Psychiatric: She has a normal mood and affect. Her behavior is normal.          Assessment & Plan:

## 2012-08-19 NOTE — Patient Instructions (Addendum)
Follow up in 1 month to recheck heart rate and sugar Increase the Glimepiride to 8 mg daily (2 tabs) to improve the sugars Continue the Metformin as directed Start the Trazodone nightly for sleep- 1/2 tab only! Call with any questions or concerns Hang in there!!!

## 2012-08-26 DIAGNOSIS — G4700 Insomnia, unspecified: Secondary | ICD-10-CM | POA: Insufficient documentation

## 2012-08-26 NOTE — Assessment & Plan Note (Signed)
Chronic problem.  Initially elevated but improved w/ time.  Check labs.  No anticipated med changes.

## 2012-08-26 NOTE — Assessment & Plan Note (Signed)
Deteriorated.  Pt reports CBGs have been elevated since surgery.  Pt has not been able to exercise, possible elevated sugars due to increased stress.  Continue metformin, increase amaryl.  Reviewed sxs of low blood sugar.  Will follow closely.

## 2012-08-26 NOTE — Assessment & Plan Note (Addendum)
New.  Suspect this is due to recent stress of knee replacement and pain/deconditioning.  Check labs to r/o anemia, thyroid abnormality or other metabolic cause.  Will follow.

## 2012-08-26 NOTE — Assessment & Plan Note (Signed)
New.  Pt having difficulty sleeping due to pain and anxiety.  Start trazodone.  Will follow.

## 2012-08-29 ENCOUNTER — Other Ambulatory Visit: Payer: Self-pay | Admitting: Family Medicine

## 2012-08-29 NOTE — Telephone Encounter (Signed)
Rx sent to the pharmacy by e-script.//AB/CMA 

## 2012-09-05 ENCOUNTER — Ambulatory Visit (INDEPENDENT_AMBULATORY_CARE_PROVIDER_SITE_OTHER): Payer: Medicare Other | Admitting: Family Medicine

## 2012-09-05 ENCOUNTER — Encounter: Payer: Self-pay | Admitting: Family Medicine

## 2012-09-05 VITALS — BP 130/60 | HR 89 | Temp 97.6°F | Ht 65.25 in | Wt 193.4 lb

## 2012-09-05 DIAGNOSIS — I10 Essential (primary) hypertension: Secondary | ICD-10-CM

## 2012-09-05 DIAGNOSIS — E119 Type 2 diabetes mellitus without complications: Secondary | ICD-10-CM

## 2012-09-05 DIAGNOSIS — Z0000 Encounter for general adult medical examination without abnormal findings: Secondary | ICD-10-CM

## 2012-09-05 LAB — HEPATIC FUNCTION PANEL
Albumin: 4.1 g/dL (ref 3.5–5.2)
Alkaline Phosphatase: 64 U/L (ref 39–117)
Bilirubin, Direct: 0 mg/dL (ref 0.0–0.3)

## 2012-09-05 LAB — LIPID PANEL
HDL: 56.8 mg/dL (ref >39.00)
VLDL: 12.8 mg/dL (ref 0.0–40.0)

## 2012-09-05 NOTE — Progress Notes (Signed)
  Subjective:    Patient ID: Carly Buckley, female    DOB: 1946/02/05, 67 y.o.   MRN: [id number]  HPI Here today for CPE.  Risk Factors: DM- chronic problem, UTD on A1C.  On Metformin, Amaryl.  On Lisinopril for renal protection.  CBGs have been labile since R TKR.  Overdue on eye exam.  Denies symptomatic lows.  No numbness/tingling hands/feet. HTN- chronic problem, on Norvasc, Lisinopril HCTZ.  No CP, SOB, HAs, visual changes, edema.  Physical Activity: now having PT 3x/week Fall Risk: currently elevated due to R TKR, using walker Depression: no current sxs Hearing: normal to conversational tones, decreased to whispered voice ADL's: independent Cognitive: normal linear thought process, memory and attention intact Home Safety: feels safe at home, lives w/ husband Height, Weight, BMI, Visual Acuity: see vitals, vision corrected to 20/20 w/ glasses Counseling: UTD on mammo, DEXA, colonoscopy ([facility]) Labs Ordered: See A&P Care Plan: See A&P    Review of Systems Patient reports no vision/ hearing changes, adenopathy,fever, weight change,  persistant/recurrent hoarseness , swallowing issues, chest pain, palpitations, edema, persistant/recurrent cough, hemoptysis, dyspnea (rest/exertional/paroxysmal nocturnal), gastrointestinal bleeding (melena, rectal bleeding), abdominal pain, significant heartburn, bowel changes, GU symptoms (dysuria, hematuria, incontinence), Gyn symptoms (abnormal  bleeding, pain),  syncope, focal weakness, memory loss, numbness & tingling, skin/hair/nail changes, abnormal bruising or bleeding, anxiety, or depression.     Objective:   Physical Exam General Appearance:    Alert, cooperative, no distress, appears stated age  Head:    Normocephalic, without obvious abnormality, atraumatic  Eyes:    PERRL, conjunctiva/corneas clear, EOM's intact, fundi    benign, both eyes  Ears:    Normal TM's and external ear canals, both ears  Nose:   Nares normal, septum midline,  mucosa normal, no drainage    or sinus tenderness  Throat:   Lips, mucosa, and tongue normal; teeth and gums normal  Neck:   Supple, symmetrical, trachea midline, no adenopathy;    Thyroid: no enlargement/tenderness/nodules  Back:     Symmetric, no curvature, ROM normal, no CVA tenderness  Lungs:     Clear to auscultation bilaterally, respirations unlabored  Chest Wall:    No tenderness or deformity   Heart:    Regular rate and rhythm, S1 and S2 normal, no murmur, rub   or gallop  Breast Exam:    Deferred to mammo  Abdomen:     Soft, non-tender, bowel sounds active all four quadrants,    no masses, no organomegaly  Genitalia:    Deferred due to TKR  Rectal:    Extremities:   Extremities normal, atraumatic, no cyanosis or edema  Pulses:   2+ and symmetric all extremities  Skin:   Skin color, texture, turgor normal, no rashes or lesions  Lymph nodes:   Cervical, supraclavicular, and axillary nodes normal  Neurologic:   CNII-XII intact, normal strength, sensation and reflexes    throughout          Assessment & Plan:

## 2012-09-05 NOTE — Assessment & Plan Note (Signed)
Chronic problem.  Reviewed last A1C- sugars remain labile due to physical stress of TKR.  Pt overdue on eye exam- encouraged her to schedule.  Reviewed importance of low carb diet.  Will follow closely.

## 2012-09-05 NOTE — Assessment & Plan Note (Signed)
Pt's PE WNL.  UTD on DEXA and colonoscopy- due to schedule mammo.  Check labs.  Anticipatory guidance provided.

## 2012-09-05 NOTE — Patient Instructions (Addendum)
Follow up in 3-4 months to recheck sugars We'll notify you of your lab results Schedule your mammo at your convenience GET YOUR EYE EXAM! Call with any questions or concerns Happy Memorial Day!

## 2012-09-05 NOTE — Assessment & Plan Note (Signed)
Chronic problem, adequate control.  Asymptomatic.  No changes. 

## 2012-09-09 ENCOUNTER — Encounter: Payer: Self-pay | Admitting: *Deleted

## 2012-09-28 IMAGING — CT CT CHEST W/ CM
2 of 5 series · 16 of 46 positions shown, 18 images · IV contrast (APPLIED)
Comparison: CT 06/27/2010

CT CHEST

CLINICAL DATA: Colon resection following colon cancer diagnosis
2333.  Chemotherapy complete.

CT CHEST, ABDOMEN AND PELVIS WITH CONTRAST
TECHNIQUE: Multidetector CT imaging of the chest, abdomen and
pelvis was performed following the standard protocol during bolus
administration of intravenous contrast.
Contrast: 100mL OMNIPAQUE IOHEXOL 300 MG/ML  SOLN

[Series 2: cap with st · axial · 0.72mm/px · z∈[-587,-52]mm · 13 of 123 slices shown, 15 images]
[im 8/123  soft-tissue]
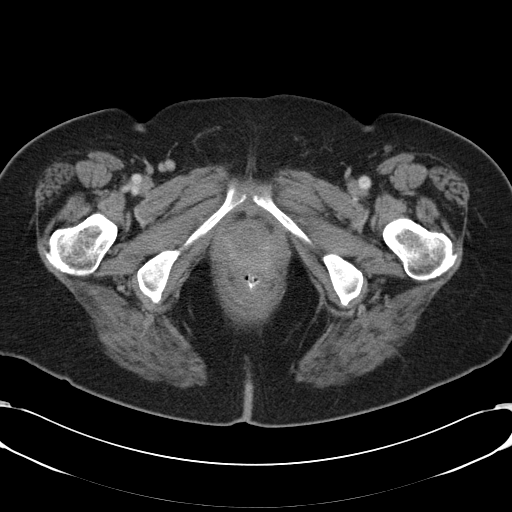
[im 8/123  bone]
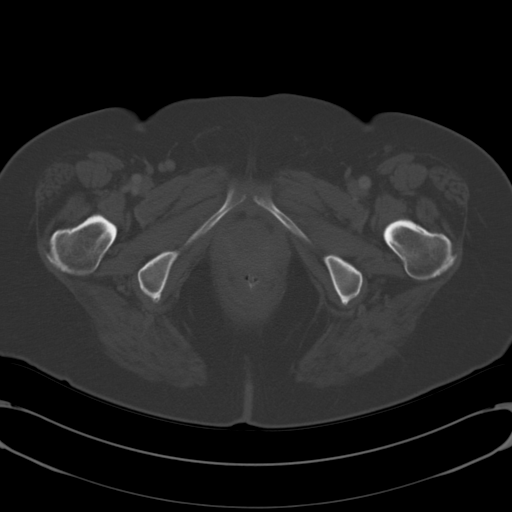
[im 15/123  soft-tissue]
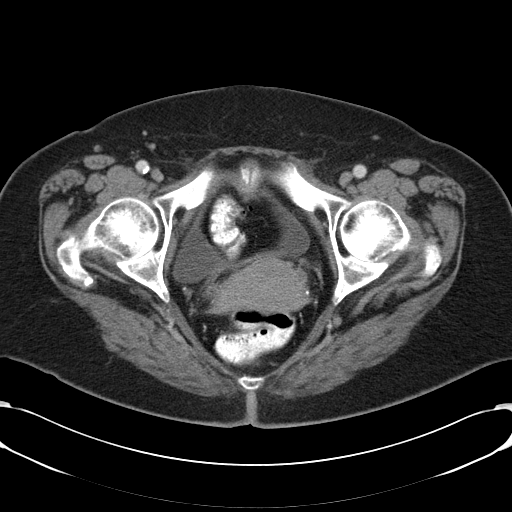
[im 29/123  soft-tissue]
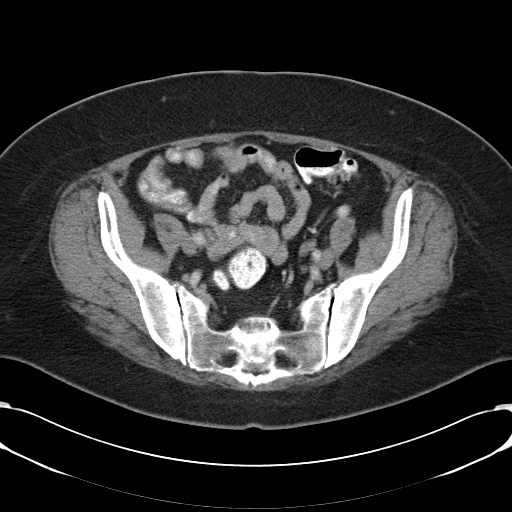
[im 36/123  soft-tissue]
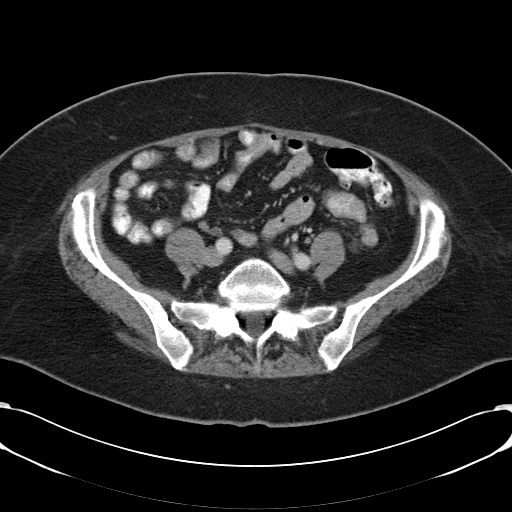
[im 44/123  soft-tissue]
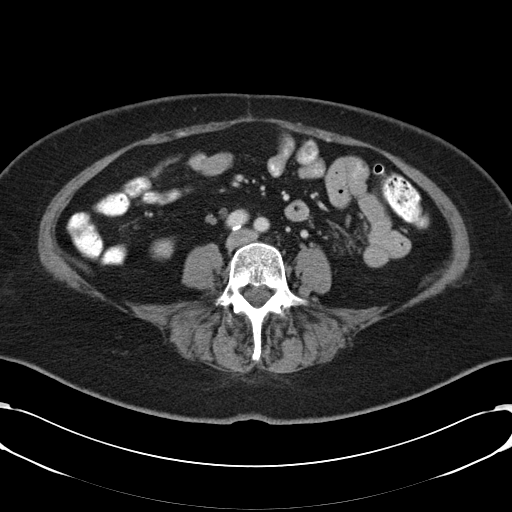
[im 51/123  soft-tissue]
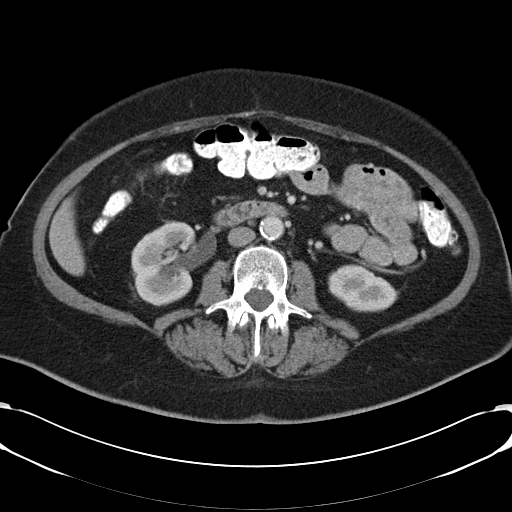
[im 65/123  soft-tissue]
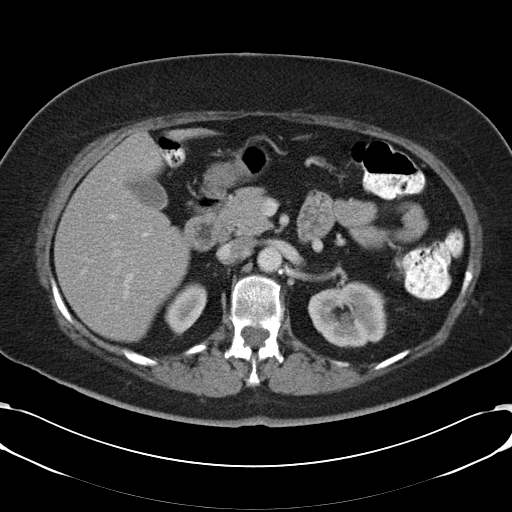
[im 72/123  soft-tissue]
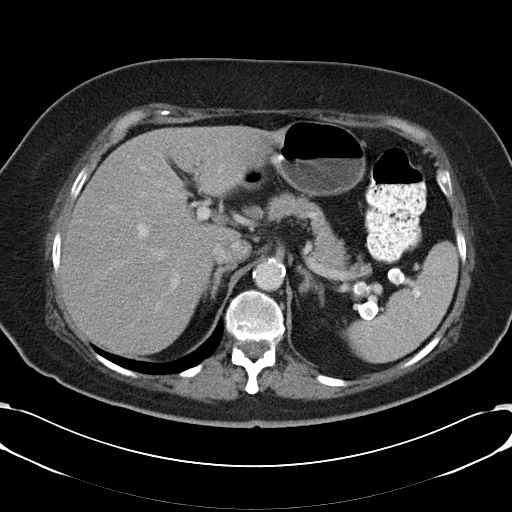
[im 79/123  soft-tissue]
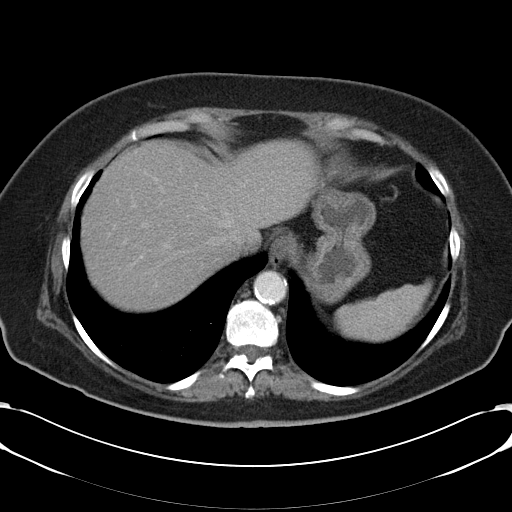
[im 79/123  bone]
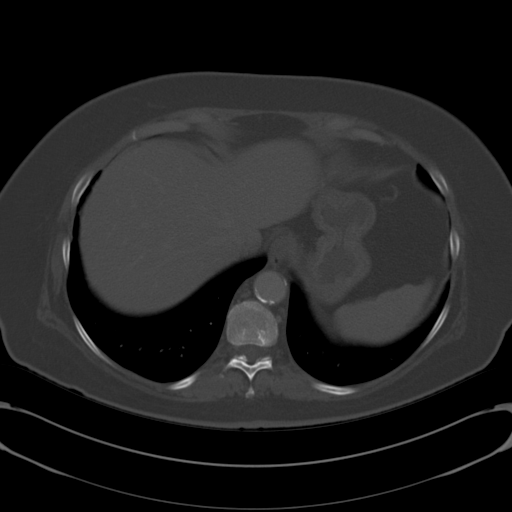
[im 87/123  soft-tissue]
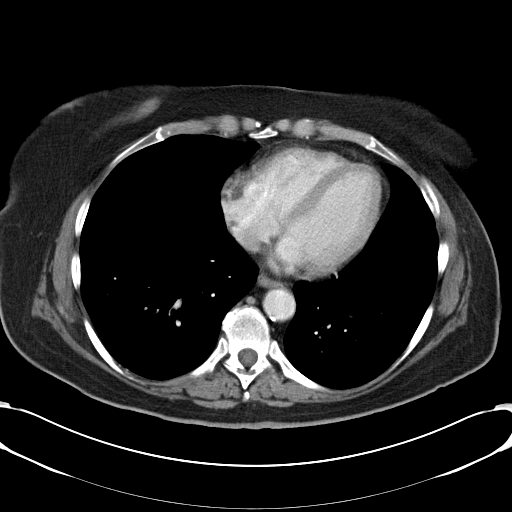
[im 94/123  soft-tissue]
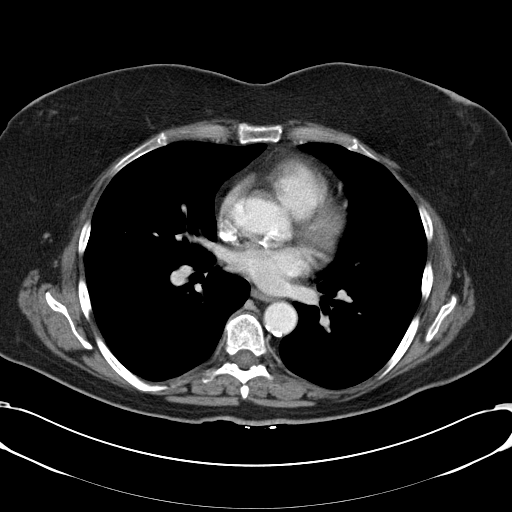
[im 108/123  soft-tissue]
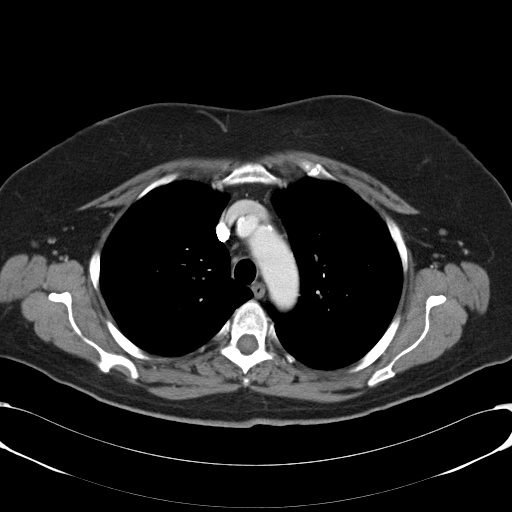
[im 115/123  soft-tissue]
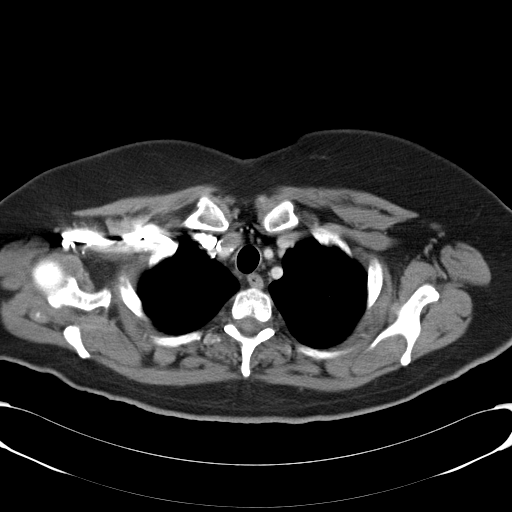

[Series 602: <mpr thick range> · coronal · 1.19mm/px · 3 of 89 slices shown]
[im 30/89  soft-tissue]
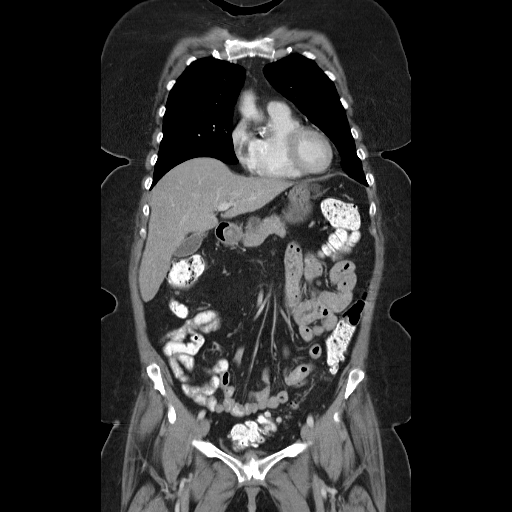
[im 40/89  soft-tissue]
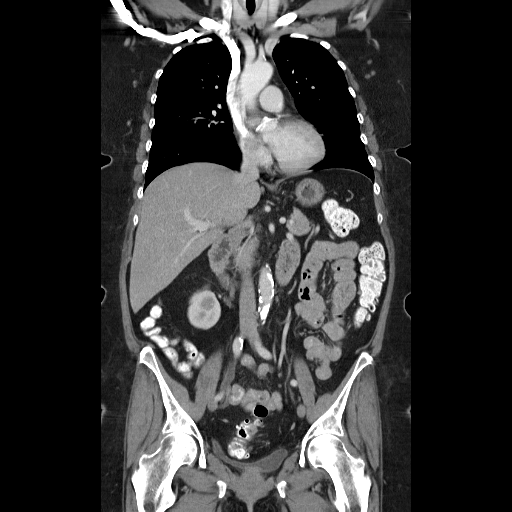
[im 49/89  soft-tissue]
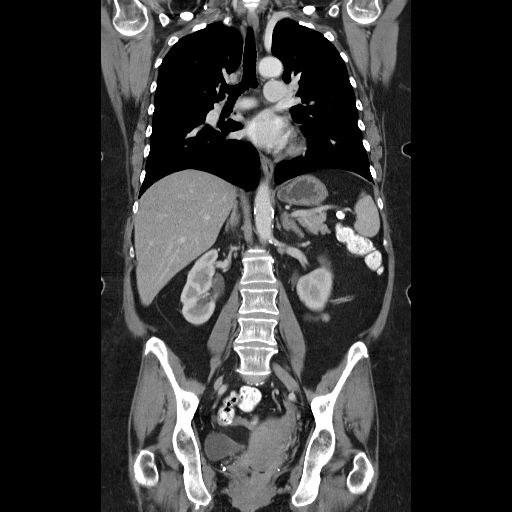

[16 of 46 positions shown; findings below may reference images not displayed]

FINDINGS: No axillary or supraclavicular lymphadenopathy.  No
mediastinal or hilar lymphadenopathy.  Coronary calcifications are
present.

Review of the lung parenchyma demonstrates no new or suspicious
pulmonary nodules.  Airways are normal.
IMPRESSION: No evidence of thoracic metastasis.

Coronary artery calcifications.

CT ABDOMEN AND PELVIS
FINDINGS: No focal hepatic lesion.  Gallbladder, pancreas, spleen,
adrenal glands, kidneys are unchanged.  There is a fatty lesion
extending from the cortex of the right kidney which is stable over
multiple comparison exams back  to 4996.

The stomach, small bowel and small bowel are normal.  There is an
enteric colonic anastomoses in the right upper quadrant consistent
with right hemicolectomy.  No nodularity or obstruction present.
Small 6 mm lymph node adjacent to the anastomosis (image 71)
compares to 5 mm on prior.  The distal colon and sigmoid colon are
unchanged.  There are diverticula the sigmoid colon.

Abdominal aorta normal caliber.  No retroperitoneal periportal
lymphadenopathy.  No mesenteric lymphadenopathy is evident.
Periportal lymph nodes are stable in size.

No free fluid the pelvis.  The bladder and uterus are normal.  No
pelvic lymphadenopathy. Review of  bone windows demonstrates no
aggressive osseous lesions.
IMPRESSION: 1..  No evidence metastasis in the abdomen or pelvis.
2.  Small lymph node adjacent to the anastomosis is not
significantly changed.  Recommend attention on follow-up.

3.  Stable periportal lymph nodes.

## 2012-11-07 ENCOUNTER — Other Ambulatory Visit: Payer: Self-pay | Admitting: Family Medicine

## 2012-11-07 ENCOUNTER — Telehealth: Payer: Self-pay | Admitting: *Deleted

## 2012-11-07 MED ORDER — NYSTATIN-TRIAMCINOLONE 100000-0.1 UNIT/GM-% EX OINT: 1.0000 "application " | TOPICAL_OINTMENT | Freq: Two times a day (BID) | CUTANEOUS | Status: DC | PRN

## 2012-11-07 NOTE — Telephone Encounter (Signed)
Rx sent to the pharmacy by e-script.//AB/CMA 

## 2012-11-07 NOTE — Telephone Encounter (Signed)
Ok for Mycolog refill, #60, 1 refill

## 2012-11-07 NOTE — Telephone Encounter (Signed)
Pharmacy is requesting a refill for Mycolog ointment last date filled 01/03/12 per historical provider. Please Advise.

## 2012-11-07 NOTE — Telephone Encounter (Signed)
Sent Rx via escribe.

## 2012-12-05 ENCOUNTER — Ambulatory Visit: Payer: Medicare Other | Admitting: Family Medicine

## 2012-12-24 ENCOUNTER — Ambulatory Visit (INDEPENDENT_AMBULATORY_CARE_PROVIDER_SITE_OTHER): Payer: Medicare Other | Admitting: Family Medicine

## 2012-12-24 ENCOUNTER — Encounter: Payer: Self-pay | Admitting: Family Medicine

## 2012-12-24 VITALS — BP 124/80 | HR 76 | Temp 97.7°F | Ht 65.25 in | Wt 190.6 lb

## 2012-12-24 DIAGNOSIS — E119 Type 2 diabetes mellitus without complications: Secondary | ICD-10-CM

## 2012-12-24 LAB — BASIC METABOLIC PANEL
CO2: 24 mEq/L (ref 19–32)
Calcium: 9.9 mg/dL (ref 8.4–10.5)
Creatinine, Ser: 0.6 mg/dL (ref 0.4–1.2)

## 2012-12-24 LAB — HEMOGLOBIN A1C: Hgb A1c MFr Bld: 7.7 % — ABNORMAL HIGH (ref 4.6–6.5)

## 2012-12-24 LAB — MICROALBUMIN / CREATININE URINE RATIO
Creatinine,U: 36.5 mg/dL
Microalb Creat Ratio: 3.8 mg/g (ref 0.0–30.0)

## 2012-12-24 NOTE — Progress Notes (Signed)
  Subjective:    Patient ID: Carly Buckley, female    DOB: [birth year], 67 y.o.   MRN: [id number]  HPI DM- chronic problem, reports CBGs are 'up and down'.  UTD on eye exam- November 27, 2012.  No CP, SOB, HAs, visual changes, edema, numbness hands/feet.  On Amaryl, Metformin.  On ACE for renal protection.  Good BP control.   Review of Systems For ROS see HPI     Objective:   Physical Exam  Vitals reviewed. Constitutional: She is oriented to person, place, and time. She appears well-developed and well-nourished. No distress.  HENT:  Head: Normocephalic and atraumatic.  Eyes: Conjunctivae and EOM are normal. Pupils are equal, round, and reactive to light.  Neck: Normal range of motion. Neck supple. No thyromegaly present.  Cardiovascular: Normal rate, regular rhythm, normal heart sounds and intact distal pulses.   No murmur heard. Pulmonary/Chest: Effort normal and breath sounds normal. No respiratory distress.  Abdominal: Soft. She exhibits no distension. There is no tenderness.  Musculoskeletal: She exhibits no edema.  Lymphadenopathy:    She has no cervical adenopathy.  Neurological: She is alert and oriented to person, place, and time.  Skin: Skin is warm and dry.  Psychiatric: She has a normal mood and affect. Her behavior is normal.          Assessment & Plan:

## 2012-12-24 NOTE — Patient Instructions (Signed)
Follow up in 3-4 months to recheck sugars and cholesterol Keep up the good work!  You look great! We'll notify you of your lab results and make any changes if needed Call with any questions or concerns Happy Fall!!!

## 2012-12-24 NOTE — Assessment & Plan Note (Signed)
Chronic problem.  Asymptomatic.  UTD on eye exam.  No neuropathy noted on foot exam.  Check labs.  Adjust meds prn.

## 2012-12-25 ENCOUNTER — Encounter: Payer: Self-pay | Admitting: General Practice

## 2012-12-29 ENCOUNTER — Other Ambulatory Visit: Payer: Self-pay | Admitting: Family Medicine

## 2012-12-29 MED ORDER — LISINOPRIL-HYDROCHLOROTHIAZIDE 20-25 MG PO TABS: 1.0000 | ORAL_TABLET | Freq: Every day | ORAL | Status: DC

## 2012-12-29 NOTE — Telephone Encounter (Signed)
Rx was refilled for Lisinop-HCTZ 20-25 mg   Ag cma

## 2012-12-29 NOTE — Telephone Encounter (Signed)
Rx filled and sent to [facility] pharmacy. SW, CMA

## 2013-01-15 ENCOUNTER — Other Ambulatory Visit: Payer: Self-pay | Admitting: Family Medicine

## 2013-01-15 NOTE — Telephone Encounter (Signed)
Med filled.  

## 2013-02-27 ENCOUNTER — Other Ambulatory Visit: Payer: Self-pay | Admitting: Family Medicine

## 2013-02-27 NOTE — Telephone Encounter (Signed)
Metformin refilled per protocol

## 2013-04-01 ENCOUNTER — Ambulatory Visit: Payer: Medicare Other | Admitting: Family Medicine

## 2013-04-17 ENCOUNTER — Ambulatory Visit: Payer: Medicare Other | Admitting: Family Medicine

## 2013-04-27 ENCOUNTER — Encounter: Payer: Self-pay | Admitting: General Practice

## 2013-04-27 ENCOUNTER — Encounter: Payer: Self-pay | Admitting: Family Medicine

## 2013-04-27 ENCOUNTER — Ambulatory Visit (INDEPENDENT_AMBULATORY_CARE_PROVIDER_SITE_OTHER): Payer: Medicare Other | Admitting: Family Medicine

## 2013-04-27 ENCOUNTER — Other Ambulatory Visit: Payer: Self-pay | Admitting: General Practice

## 2013-04-27 VITALS — BP 142/80 | HR 98 | Temp 98.2°F | Resp 16 | Wt 193.1 lb

## 2013-04-27 DIAGNOSIS — E119 Type 2 diabetes mellitus without complications: Secondary | ICD-10-CM

## 2013-04-27 DIAGNOSIS — I10 Essential (primary) hypertension: Secondary | ICD-10-CM

## 2013-04-27 LAB — HEMOGLOBIN A1C: Hgb A1c MFr Bld: 7.3 % — ABNORMAL HIGH (ref 4.6–6.5)

## 2013-04-27 LAB — BASIC METABOLIC PANEL
BUN: 27 mg/dL — AB (ref 6–23)
CHLORIDE: 104 meq/L (ref 96–112)
CO2: 24 mEq/L (ref 19–32)
Calcium: 10 mg/dL (ref 8.4–10.5)
Creatinine, Ser: 0.8 mg/dL (ref 0.4–1.2)
GFR: 78.2 mL/min (ref >60.00)
GLUCOSE: 121 mg/dL — AB (ref 70–99)
POTASSIUM: 4.5 meq/L (ref 3.5–5.1)
Sodium: 140 mEq/L (ref 135–145)

## 2013-04-27 LAB — LIPID PANEL
CHOLESTEROL: 159 mg/dL (ref 0–200)
HDL: 55 mg/dL (ref >39.00)
LDL Cholesterol: 91 mg/dL (ref 0–99)
TRIGLYCERIDES: 64 mg/dL (ref 0.0–149.0)
Total CHOL/HDL Ratio: 3
VLDL: 12.8 mg/dL (ref 0.0–40.0)

## 2013-04-27 LAB — HEPATIC FUNCTION PANEL
ALT: 17 U/L (ref 0–35)
AST: 15 U/L (ref 0–37)
Albumin: 4.2 g/dL (ref 3.5–5.2)
Alkaline Phosphatase: 60 U/L (ref 39–117)
BILIRUBIN DIRECT: 0 mg/dL (ref 0.0–0.3)
TOTAL PROTEIN: 7.6 g/dL (ref 6.0–8.3)
Total Bilirubin: 0.6 mg/dL (ref 0.3–1.2)

## 2013-04-27 MED ORDER — AMLODIPINE BESYLATE 10 MG PO TABS: ORAL_TABLET | ORAL | Status: DC

## 2013-04-27 MED ORDER — METFORMIN HCL 500 MG PO TABS: ORAL_TABLET | ORAL | Status: DC

## 2013-04-27 MED ORDER — GLIMEPIRIDE 4 MG PO TABS: ORAL_TABLET | ORAL | Status: DC

## 2013-04-27 NOTE — Assessment & Plan Note (Signed)
Chronic problem.  Typically well controlled.  UTD on eye exam.  On ARB for renal protection.  Asymptomatic.  Foot exam done today.  Check labs.  Adjust meds prn

## 2013-04-27 NOTE — Assessment & Plan Note (Signed)
Chronic problem.  Adequate control.  Slightly elevated today b/c pt in anxious.  Check labs.  No anticipated med changes.

## 2013-04-27 NOTE — Patient Instructions (Signed)
Schedule your complete physical for the end of May We'll notify you of your lab results and make any changes if needed Keep up the good work!  You look great! Call with any questions or concerns Happy New Year!!!

## 2013-04-27 NOTE — Progress Notes (Signed)
Pre visit review using our clinic review tool, if applicable. No additional management support is needed unless otherwise documented below in the visit note. 

## 2013-04-27 NOTE — Progress Notes (Signed)
   Subjective:    Patient ID: Carly Buckley, female    DOB: 1946-03-26, 68 y.o.   MRN: [id number]  HPI DM- chronic problem, on Metformin and amaryl.  On ACE for renal protection.  UTD on eye exam.  Denies shaky or dizziness.  No N/V/D.  Not checking CBGs.  No numbness or tingling in hands/feet.  HTN- chronic problem, on Lisinopril/HCTZ, norvasc.  BP up slightly today but pt reports she's 'anxious'.  No CP, SOB, HAs, visual changes, edema.   Review of Systems For ROS see HPI     Objective:   Physical Exam  Vitals reviewed. Constitutional: She is oriented to person, place, and time. She appears well-developed and well-nourished. No distress.  HENT:  Head: Normocephalic and atraumatic.  Eyes: Conjunctivae and EOM are normal. Pupils are equal, round, and reactive to light.  Neck: Normal range of motion. Neck supple. No thyromegaly present.  Cardiovascular: Normal rate, regular rhythm, normal heart sounds and intact distal pulses.   No murmur heard. Pulmonary/Chest: Effort normal and breath sounds normal. No respiratory distress.  Abdominal: Soft. She exhibits no distension. There is no tenderness.  Musculoskeletal: She exhibits no edema.  Lymphadenopathy:    She has no cervical adenopathy.  Neurological: She is alert and oriented to person, place, and time.  Skin: Skin is warm and dry.  Psychiatric: She has a normal mood and affect. Her behavior is normal.          Assessment & Plan:

## 2013-04-28 ENCOUNTER — Telehealth: Payer: Self-pay

## 2013-04-28 NOTE — Telephone Encounter (Signed)
Relevant patient education mailed to patient.  

## 2013-05-11 ENCOUNTER — Other Ambulatory Visit: Payer: Self-pay | Admitting: Family Medicine

## 2013-05-11 NOTE — Telephone Encounter (Signed)
Med filled.  

## 2013-05-12 ENCOUNTER — Encounter: Payer: Self-pay | Admitting: *Deleted

## 2013-05-12 NOTE — Progress Notes (Signed)
Mailed completed application for handicap sticker to patient's home.

## 2013-05-19 ENCOUNTER — Telehealth: Payer: Self-pay | Admitting: Family Medicine

## 2013-05-19 NOTE — Telephone Encounter (Signed)
Relevant patient education mailed to patient.  

## 2013-07-10 LAB — HM COLONOSCOPY

## 2013-07-14 ENCOUNTER — Encounter: Payer: Self-pay | Admitting: General Practice

## 2013-07-24 ENCOUNTER — Other Ambulatory Visit: Payer: Medicare Other

## 2013-07-24 ENCOUNTER — Ambulatory Visit: Payer: Medicare Other | Admitting: Oncology

## 2013-07-28 ENCOUNTER — Telehealth: Payer: Self-pay | Admitting: Oncology

## 2013-07-28 NOTE — Telephone Encounter (Signed)
returned call re r/s appt. lmonvm for pt to call to let us know when she can come in.

## 2013-07-29 ENCOUNTER — Telehealth: Payer: Self-pay | Admitting: Oncology

## 2013-07-29 NOTE — Telephone Encounter (Signed)
, °

## 2013-08-24 ENCOUNTER — Other Ambulatory Visit: Payer: Self-pay | Admitting: Family Medicine

## 2013-08-24 NOTE — Telephone Encounter (Signed)
Med filled.  

## 2013-08-25 ENCOUNTER — Other Ambulatory Visit: Payer: Self-pay | Admitting: Oncology

## 2013-08-25 ENCOUNTER — Telehealth: Payer: Self-pay | Admitting: Oncology

## 2013-08-25 ENCOUNTER — Encounter: Payer: Self-pay | Admitting: Oncology

## 2013-08-25 ENCOUNTER — Other Ambulatory Visit (HOSPITAL_BASED_OUTPATIENT_CLINIC_OR_DEPARTMENT_OTHER): Payer: Medicare Other

## 2013-08-25 ENCOUNTER — Ambulatory Visit (HOSPITAL_BASED_OUTPATIENT_CLINIC_OR_DEPARTMENT_OTHER): Payer: Medicare Other | Admitting: Oncology

## 2013-08-25 VITALS — BP 151/60 | HR 93 | Temp 97.9°F | Resp 19 | Ht 62.0 in | Wt 192.8 lb

## 2013-08-25 DIAGNOSIS — Z85038 Personal history of other malignant neoplasm of large intestine: Secondary | ICD-10-CM

## 2013-08-25 DIAGNOSIS — C189 Malignant neoplasm of colon, unspecified: Secondary | ICD-10-CM

## 2013-08-25 DIAGNOSIS — G609 Hereditary and idiopathic neuropathy, unspecified: Secondary | ICD-10-CM

## 2013-08-25 LAB — COMPREHENSIVE METABOLIC PANEL (CC13)
ALK PHOS: 75 U/L (ref 40–150)
ALT: 16 U/L (ref 0–55)
ANION GAP: 14 meq/L — AB (ref 3–11)
AST: 10 U/L (ref 5–34)
Albumin: 3.8 g/dL (ref 3.5–5.0)
BILIRUBIN TOTAL: 0.31 mg/dL (ref 0.20–1.20)
BUN: 20.1 mg/dL (ref 7.0–26.0)
CO2: 23 meq/L (ref 22–29)
Calcium: 10.2 mg/dL (ref 8.4–10.4)
Chloride: 106 mEq/L (ref 98–109)
Creatinine: 0.8 mg/dL (ref 0.6–1.1)
Glucose: 175 mg/dl — ABNORMAL HIGH (ref 70–140)
Potassium: 4 mEq/L (ref 3.5–5.1)
Sodium: 143 mEq/L (ref 136–145)
Total Protein: 7.1 g/dL (ref 6.4–8.3)

## 2013-08-25 LAB — CBC WITH DIFFERENTIAL/PLATELET
BASO%: 0.5 % (ref 0.0–2.0)
BASOS ABS: 0 10*3/uL (ref 0.0–0.1)
EOS ABS: 0.3 10*3/uL (ref 0.0–0.5)
EOS%: 4.2 % (ref 0.0–7.0)
HEMATOCRIT: 36.9 % (ref 34.8–46.6)
HGB: 11.9 g/dL (ref 11.6–15.9)
LYMPH%: 27.1 % (ref 14.0–49.7)
MCH: 29 pg (ref 25.1–34.0)
MCHC: 32.2 g/dL (ref 31.5–36.0)
MCV: 90 fL (ref 79.5–101.0)
MONO#: 0.4 10*3/uL (ref 0.1–0.9)
MONO%: 7.1 % (ref 0.0–14.0)
NEUT#: 3.8 10*3/uL (ref 1.5–6.5)
NEUT%: 61.1 % (ref 38.4–76.8)
PLATELETS: 194 10*3/uL (ref 145–400)
RBC: 4.1 10*6/uL (ref 3.70–5.45)
RDW: 14.5 % (ref 11.2–14.5)
WBC: 6.2 10*3/uL (ref 3.9–10.3)
lymph#: 1.7 10*3/uL (ref 0.9–3.3)

## 2013-08-25 LAB — CEA: CEA: 1.9 ng/mL (ref 0.0–5.0)

## 2013-08-25 IMAGING — CR DG CHEST 2V
2 series · 2 of 2 positions shown · non-contrast
Comparison: CT chest 07/23/2011 and plain films of the chest
06/25/2011.

CLINICAL DATA: Preoperative respiratory films.

CHEST - 2 VIEW

[w chest pa]
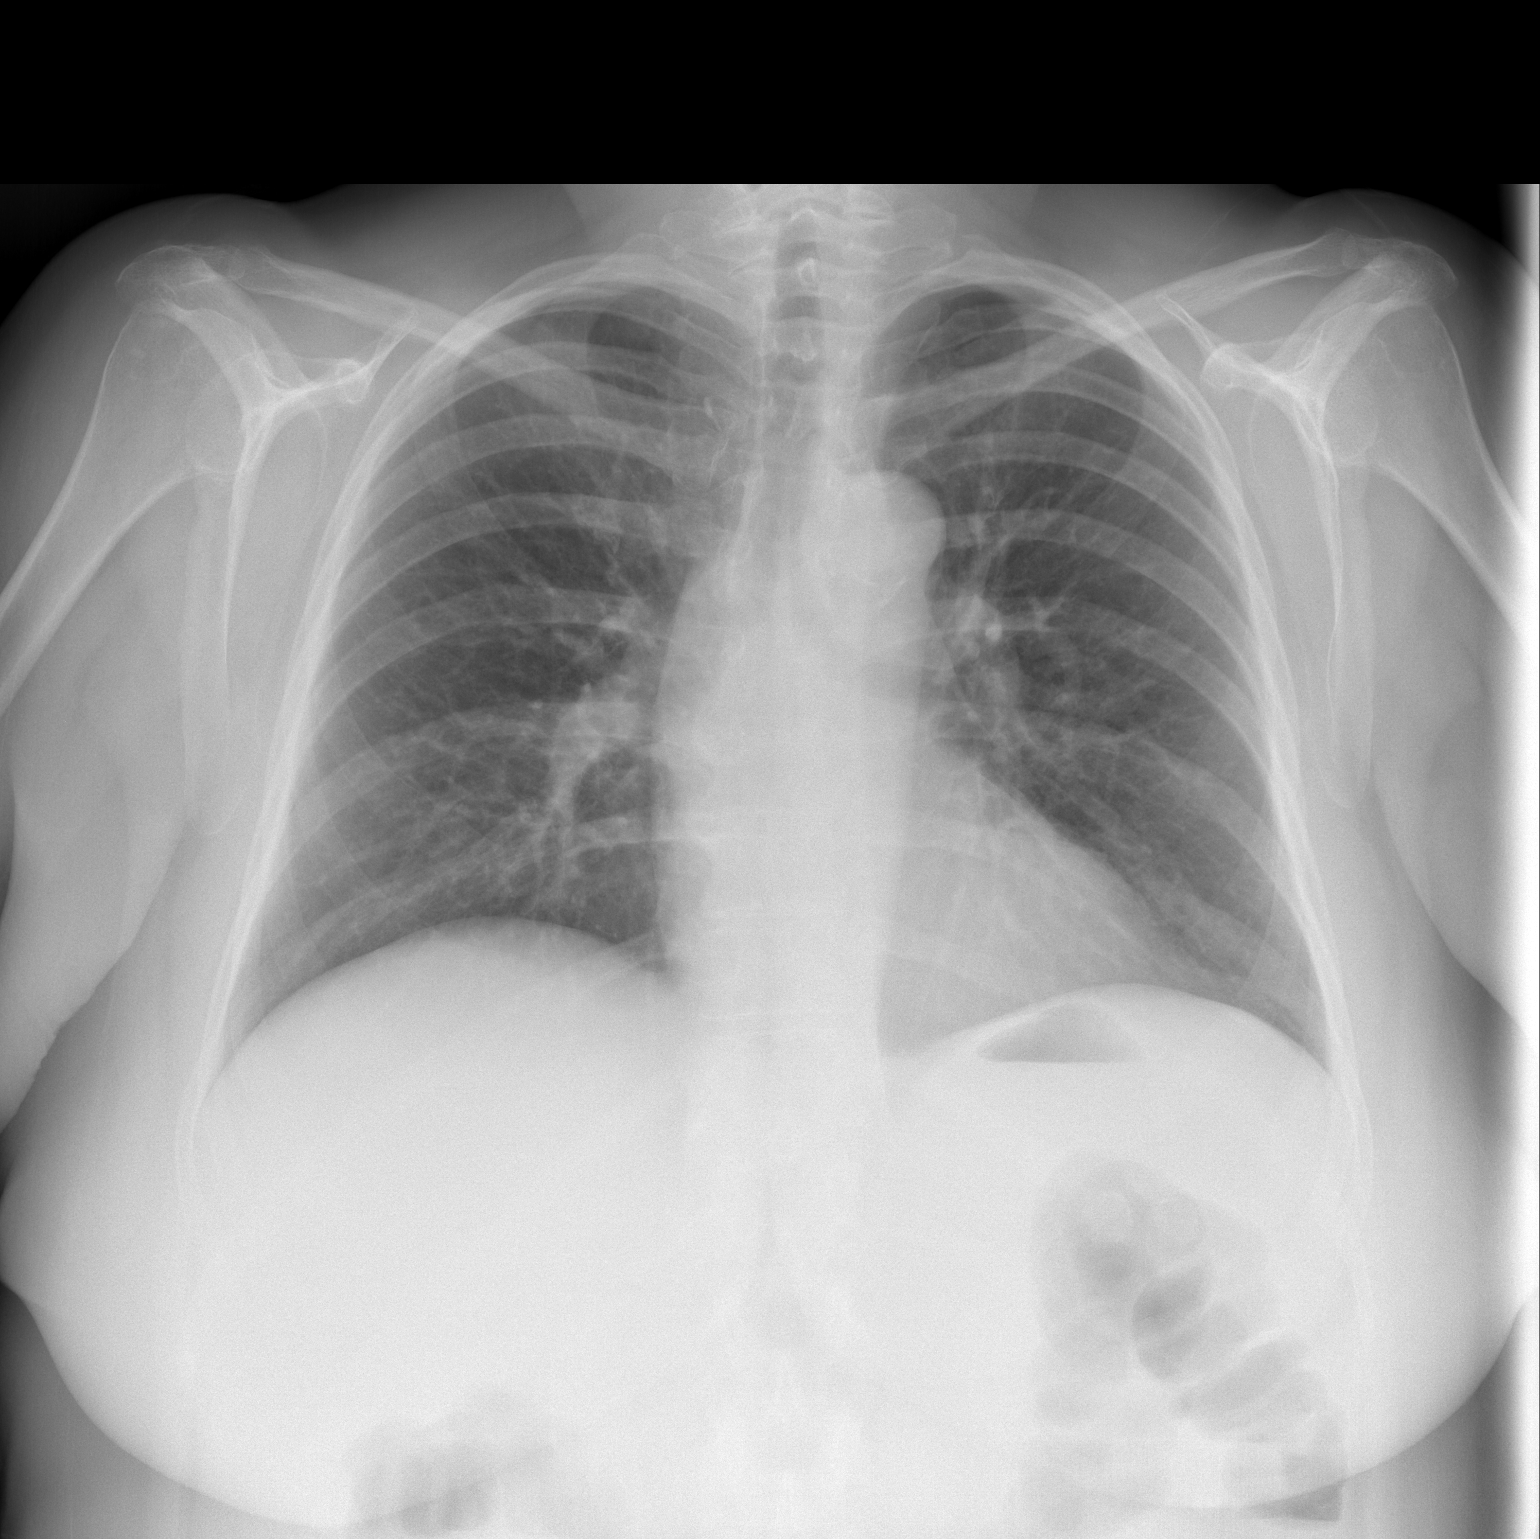

[w chest lat]
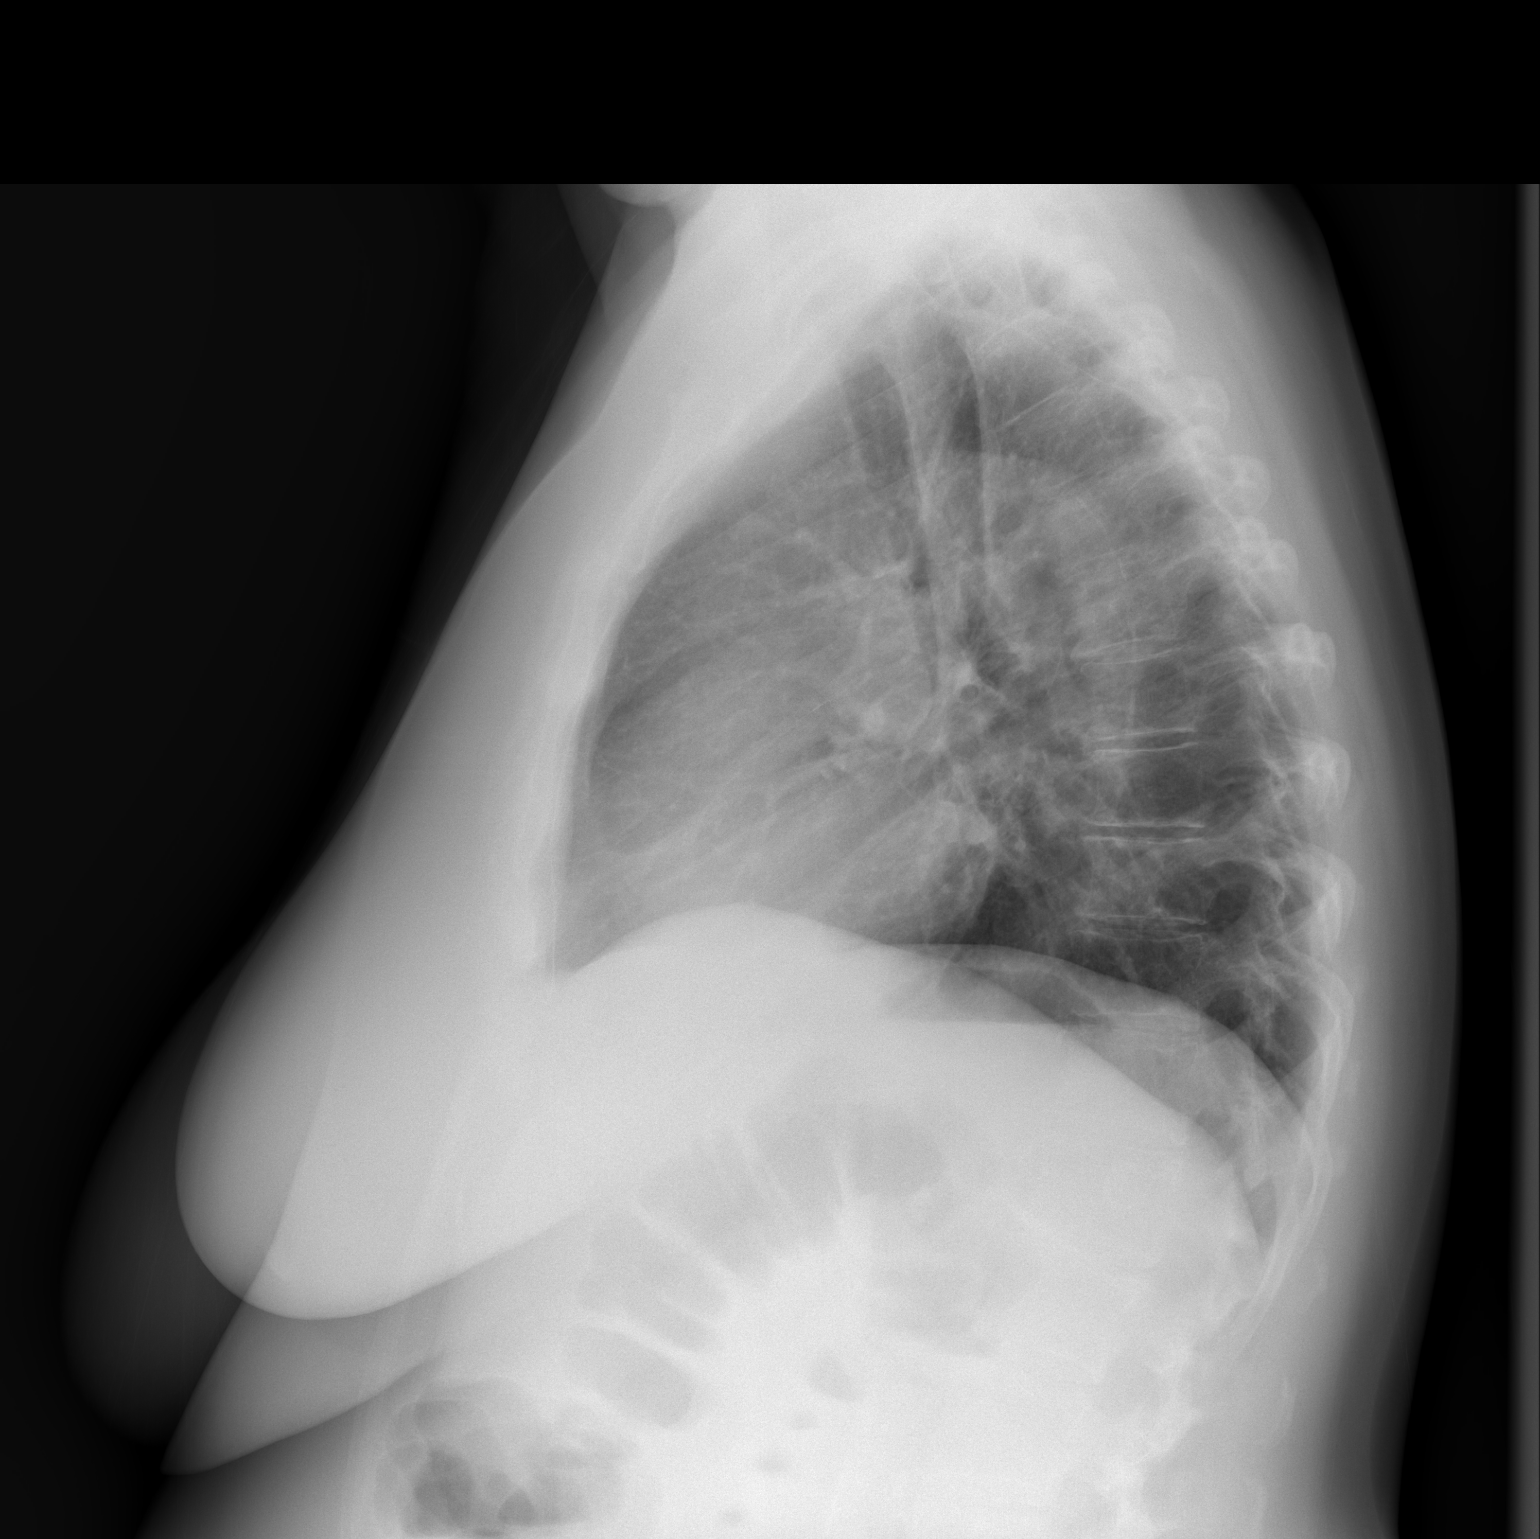

[2 of 2 positions shown; findings below may reference images not displayed]

FINDINGS: The lungs are clear.  Heart size is normal.  No
pneumothorax or pleural effusion.
IMPRESSION: Negative chest.

## 2013-08-25 NOTE — Telephone Encounter (Signed)
Gave pt appt for lab and MD for  May 2016 °

## 2013-08-25 NOTE — Progress Notes (Signed)
Hematology and Oncology Follow Up Visit  Carly Buckley [id number] [birth year] 68 y.o. 08/25/2013 1:39 PM  CC: Carly Buckley, M.D.  Carly Buckley, M.D.  Carly Buckley, M.D.    Principle Diagnosis: This is a 68 year old woman with colon cancer diagnosed in June 2007, T3 N1 right-sided colon cancer.   Prior Therapy:  1. She had laparoscopically-assisted hemicolectomy done on September 19, 2005.  Had 2 cm tumor with 1/24 lymph nodes involved. 2. The patient received adjuvant FOLFOX chemotherapy utilizing 5-FU, leucovorin, and oxaliplatin therapy.  Therapy concluded in December 2007.  Therapy complicated by mild neutropenia and peripheral neuropathy.  Current therapy: Observation and surveillance.  Interim History:  Carly Buckley presents today for a followup visit by herself. She is a 68 year old woman with history of stage III colon cancer was diagnosed initially in June 2007, received adjuvant chemotherapy concluded in December 2007, and has had really no signs of cancer recurrence since.  Since her last visit, she continues to do well.  She is up to speed on her colonoscopies the most recent of which was done this year and continues to be asymptomatic.  She is not reporting any abdominal pain or distention.  Does not report any hematochezia.  Does not report any melena. She still has some residual grade one peripheral neuropathy that is unchanged. She does not report any headaches or blurry vision or double vision. Does not report any chest pain shortness of breath or difficulty breathing. She has gained weight since her last visit but she still relatively active. She has not reported any frequency urgency or hesitancy. Does not report any musculoskeletal complaints. She has not reported any rashes or lesions or petechiae. The rest of review of system is unremarkable.  Medications: I have reviewed the patient's current medications.   Current Outpatient Prescriptions  Medication Sig Dispense  Refill  . amLODipine (NORVASC) 10 MG tablet TAKE ONE TABLET BY MOUTH ONCE DAILY  30 tablet  5  . aspirin 81 MG tablet Take 81 mg by mouth daily.      Marland Kitchen glimepiride (AMARYL) 4 MG tablet TAKE TWO TABLETS BY MOUTH ONCE DAILY  60 tablet  5  . lisinopril-hydrochlorothiazide (PRINZIDE,ZESTORETIC) 20-25 MG per tablet TAKE ONE TABLET BY MOUTH ONCE DAILY BEFORE BREAKFAST  30 tablet  6  . metFORMIN (GLUCOPHAGE) 1000 MG tablet TAKE ONE TABLET BY MOUTH TWICE DAILY WITH FOOD  60 tablet  5  . metFORMIN (GLUCOPHAGE) 500 MG tablet TAKE ONE TABLET BY MOUTH EVERY DAY AFTER BREAKFAST  30 tablet  5  . nystatin-triamcinolone ointment (MYCOLOG) APPLY 1 APPLICATION  TOPICALLY TO STOMACH TWICE DAILY AS NEEDED FOR  RASH  30 g  3  . ONE TOUCH ULTRA TEST test strip USE AS DIRECTED TO TEST BLOOD SUGAR  100 each  2  . PREMARIN vaginal cream INSERT VAGINALLY DAILY AS DIRECTED  30 g  11  . Propylene Glycol (SYSTANE BALANCE) 0.6 % SOLN Place 1 drop into both eyes daily as needed (for dry eyes).      . traMADol (ULTRAM) 50 MG tablet Take 1-2 tablets (50-100 mg total) by mouth every 6 (six) hours as needed.  80 tablet  1  . traZODone (DESYREL) 50 MG tablet Take 0.5-1 tablets (25-50 mg total) by mouth at bedtime as needed for sleep.  30 tablet  3   No current facility-administered medications for this visit.     Allergies:  Allergies  Allergen Reactions  . Morphine And Related  Nauseated      Physical Exam: Blood pressure 151/60, pulse 93, temperature 97.9 F (36.6 C), temperature source Oral, resp. rate 19, height 5\' 2"  (1.575 m), weight 192 lb 12.8 oz (87.454 kg), SpO2 99.00%. ECOG: 1 General appearance: alert awake not in any distress Head: Normocephalic, without obvious abnormality.  Neck: no adenopathy, no masses.  Lymph nodes: Cervical, supraclavicular, and axillary nodes normal. Heart:regular rate and rhythm, S1, S2.  Lung:chest clear, no wheezing.  Abdomin: soft, non-tender, without masses or  organomegaly. No shifting dullness. No rebound or guarding. EXT:no erythema, induration, or nodules. Neurological examination: No deficits noted.   Lab Results: Lab Results  Component Value Date   WBC 6.2 08/25/2013   HGB 11.9 08/25/2013   HCT 36.9 08/25/2013   MCV 90.0 08/25/2013   PLT 194 08/25/2013     Chemistry      Component Value Date/Time   NA 140 04/27/2013 0958   NA 138 07/24/2012 1450   NA 145 07/23/2011 1101   K 4.5 04/27/2013 0958   K 4.2 07/24/2012 1450   K 3.9 07/23/2011 1101   CL 104 04/27/2013 0958   CL 102 07/24/2012 1450   CL 100 07/23/2011 1101   CO2 24 04/27/2013 0958   CO2 24 07/24/2012 1450   CO2 27 07/23/2011 1101   BUN 27* 04/27/2013 0958   BUN 17.6 07/24/2012 1450   BUN 22 07/23/2011 1101   CREATININE 0.8 04/27/2013 0958   CREATININE 0.8 07/24/2012 1450   CREATININE 1.0 07/23/2011 1101      Component Value Date/Time   CALCIUM 10.0 04/27/2013 0958   CALCIUM 9.8 07/24/2012 1450   CALCIUM 9.5 07/23/2011 1101   ALKPHOS 60 04/27/2013 0958   ALKPHOS 80 07/24/2012 1450   ALKPHOS 71 07/23/2011 1101   AST 15 04/27/2013 0958   AST 17 07/24/2012 1450   AST 18 07/23/2011 1101   ALT 17 04/27/2013 0958   ALT 30 07/24/2012 1450   ALT 20 07/23/2011 1101   BILITOT 0.6 04/27/2013 0958   BILITOT 0.37 07/24/2012 1450   BILITOT 0.50 07/23/2011 1101     Impression and Plan:  This is a pleasant 68 year old female with the following issues. 1. Stage III colon cancer, T3 N1 disease.  She is over 5 years (close to 8) out from her surgery and the conclusion of her chemotherapy.  No evidence to suggest recurrent disease.  Her last CT scan in 2013 showed no relapse at this time. The plan is to ask her to come back in 12 months' time and repeat imaging studies as needed.  Also repeat liver function tests and CEA.  We will certainly do that sooner if needed.  2. Colonoscopic screening.  Last was done in 2015 a likely will be repeated in 5 years. 3. Peripheral neuropathy.  Seems to have resolved at this time and  very faint at this time.  Wyatt Portela, MD 5/12/20151:39 PM

## 2013-09-11 ENCOUNTER — Encounter: Payer: Medicare Other | Admitting: Family Medicine

## 2013-10-03 ENCOUNTER — Other Ambulatory Visit: Payer: Self-pay | Admitting: Family Medicine

## 2013-10-05 NOTE — Telephone Encounter (Signed)
Med filled.  

## 2013-10-29 ENCOUNTER — Ambulatory Visit: Payer: Medicare Other | Admitting: Family Medicine

## 2013-10-30 ENCOUNTER — Ambulatory Visit (INDEPENDENT_AMBULATORY_CARE_PROVIDER_SITE_OTHER): Payer: Medicare Other | Admitting: Family Medicine

## 2013-10-30 ENCOUNTER — Encounter: Payer: Self-pay | Admitting: Family Medicine

## 2013-10-30 VITALS — BP 124/68 | HR 97 | Temp 97.7°F | Resp 16 | Wt 189.4 lb

## 2013-10-30 DIAGNOSIS — J4520 Mild intermittent asthma, uncomplicated: Secondary | ICD-10-CM

## 2013-10-30 DIAGNOSIS — J45909 Unspecified asthma, uncomplicated: Secondary | ICD-10-CM

## 2013-10-30 DIAGNOSIS — I10 Essential (primary) hypertension: Secondary | ICD-10-CM

## 2013-10-30 DIAGNOSIS — E119 Type 2 diabetes mellitus without complications: Secondary | ICD-10-CM

## 2013-10-30 LAB — CBC WITH DIFFERENTIAL/PLATELET
Basophils Absolute: 0 10*3/uL (ref 0.0–0.1)
Basophils Relative: 0.5 % (ref 0.0–3.0)
EOS ABS: 0.4 10*3/uL (ref 0.0–0.7)
EOS PCT: 5.6 % — AB (ref 0.0–5.0)
HEMATOCRIT: 39.2 % (ref 36.0–46.0)
Hemoglobin: 12.9 g/dL (ref 12.0–15.0)
LYMPHS ABS: 1.9 10*3/uL (ref 0.7–4.0)
Lymphocytes Relative: 24.1 % (ref 12.0–46.0)
MCHC: 32.9 g/dL (ref 30.0–36.0)
MCV: 90.9 fl (ref 78.0–100.0)
MONO ABS: 0.3 10*3/uL (ref 0.1–1.0)
Monocytes Relative: 4 % (ref 3.0–12.0)
Neutro Abs: 5.2 10*3/uL (ref 1.4–7.7)
Neutrophils Relative %: 65.8 % (ref 43.0–77.0)
Platelets: 182 10*3/uL (ref 150.0–400.0)
RBC: 4.31 Mil/uL (ref 3.87–5.11)
RDW: 15.1 % (ref 11.5–15.5)
WBC: 7.9 10*3/uL (ref 4.0–10.5)

## 2013-10-30 LAB — BASIC METABOLIC PANEL
BUN: 23 mg/dL (ref 6–23)
CO2: 24 mEq/L (ref 19–32)
Calcium: 10.2 mg/dL (ref 8.4–10.5)
Chloride: 102 mEq/L (ref 96–112)
Creatinine, Ser: 0.8 mg/dL (ref 0.4–1.2)
GFR: 75.84 mL/min (ref >60.00)
Glucose, Bld: 208 mg/dL — ABNORMAL HIGH (ref 70–99)
Potassium: 4.3 mEq/L (ref 3.5–5.1)
Sodium: 136 mEq/L (ref 135–145)

## 2013-10-30 LAB — LIPID PANEL
CHOLESTEROL: 164 mg/dL (ref 0–200)
HDL: 57.6 mg/dL (ref >39.00)
LDL CALC: 94 mg/dL (ref 0–99)
NonHDL: 106.4
Total CHOL/HDL Ratio: 3
Triglycerides: 62 mg/dL (ref 0.0–149.0)
VLDL: 12.4 mg/dL (ref 0.0–40.0)

## 2013-10-30 LAB — HEPATIC FUNCTION PANEL
ALT: 22 U/L (ref 0–35)
AST: 18 U/L (ref 0–37)
Albumin: 4.3 g/dL (ref 3.5–5.2)
Alkaline Phosphatase: 75 U/L (ref 39–117)
BILIRUBIN DIRECT: 0 mg/dL (ref 0.0–0.3)
BILIRUBIN TOTAL: 0.3 mg/dL (ref 0.2–1.2)
Total Protein: 7.6 g/dL (ref 6.0–8.3)

## 2013-10-30 LAB — HEMOGLOBIN A1C: Hgb A1c MFr Bld: 7.5 % — ABNORMAL HIGH (ref 4.6–6.5)

## 2013-10-30 MED ORDER — CETIRIZINE HCL 10 MG PO TABS: 10.0000 mg | ORAL_TABLET | Freq: Every day | ORAL | Status: DC

## 2013-10-30 MED ORDER — ALBUTEROL SULFATE HFA 108 (90 BASE) MCG/ACT IN AERS: 2.0000 | INHALATION_SPRAY | RESPIRATORY_TRACT | Status: DC | PRN

## 2013-10-30 MED ORDER — ALBUTEROL SULFATE (2.5 MG/3ML) 0.083% IN NEBU
2.5000 mg | INHALATION_SOLUTION | Freq: Once | RESPIRATORY_TRACT | Status: AC
  Administered 2013-10-30: 2.5 mg via RESPIRATORY_TRACT

## 2013-10-30 NOTE — Patient Instructions (Signed)
Follow up as scheduled We'll notify you of your lab results and make any changes if needed Start the Zyrtec daily to decrease allergy inflammation and congestion Use the Albuterol inhaler- 2 puffs- only as needed for chest tightness, wheezing, or shortness of breath Drink plenty of fluids Call with any questions or concerns Hang in there!

## 2013-10-30 NOTE — Assessment & Plan Note (Addendum)
New.  Suspect this is due to particle inhalation during pt's recent remodeling.  Her 'funny feeling' improved after neb tx.  Lungs were clear on exam.  She has copious PND which is probably exacerbating situation.  Start OTC antihistamine daily and use albuterol prn.  Reviewed supportive care and red flags that should prompt return.  Pt expressed understanding and is in agreement w/ plan.

## 2013-10-30 NOTE — Assessment & Plan Note (Signed)
Chronic problem.  Tolerating meds w/o difficulty.  No symptomatic lows.  Check labs.  Adjust meds prn

## 2013-10-30 NOTE — Assessment & Plan Note (Signed)
Chronic problem.  Well controlled.  Asymptomatic.  No anticipated med changes.  Check labs.

## 2013-10-30 NOTE — Progress Notes (Signed)
Pre visit review using our clinic review tool, if applicable. No additional management support is needed unless otherwise documented below in the visit note. 

## 2013-10-30 NOTE — Progress Notes (Signed)
   Subjective:    Patient ID: Carly Buckley, female    DOB: [birth year], 68 y.o.   MRN: [id number]  HPI DM- chronic problem, on Metformin and Amaryl.  No symptomatic lows.  + chemo induced neuropathy.  HTN- chronic problem, well controlled on Norvasc, Lisinopril HCTZ.  No CP, new intermittent SOB (see below).  No HAs, visual changes, edema.  Cough- pt was remodeling bathroom and was sanding drywall w/o a mask.  Now having some SOB, 'a funny feeling'.  sxs started ~6 weeks ago.  No fevers.     Review of Systems For ROS see HPI     Objective:   Physical Exam  Constitutional: She appears well-developed and well-nourished. No distress.  HENT:  Head: Normocephalic and atraumatic.  Right Ear: Tympanic membrane normal.  Left Ear: Tympanic membrane normal.  Nose: Mucosal edema and rhinorrhea present. Right sinus exhibits no maxillary sinus tenderness and no frontal sinus tenderness. Left sinus exhibits no maxillary sinus tenderness and no frontal sinus tenderness.  Mouth/Throat: Mucous membranes are normal. Posterior oropharyngeal erythema (w/ PND) present.  Eyes: Conjunctivae and EOM are normal. Pupils are equal, round, and reactive to light.  Neck: Normal range of motion. Neck supple.  Cardiovascular: Normal rate, regular rhythm and normal heart sounds.   Pulmonary/Chest: Effort normal and breath sounds normal. No respiratory distress. She has no wheezes. She has no rales.  Musculoskeletal: She exhibits no edema.  Lymphadenopathy:    She has no cervical adenopathy.          Assessment & Plan:

## 2013-11-02 ENCOUNTER — Other Ambulatory Visit: Payer: Self-pay | Admitting: Family Medicine

## 2013-11-03 NOTE — Telephone Encounter (Signed)
Med filled.  

## 2013-12-02 ENCOUNTER — Other Ambulatory Visit: Payer: Self-pay | Admitting: Family Medicine

## 2013-12-02 NOTE — Telephone Encounter (Signed)
Med filled.  

## 2013-12-08 ENCOUNTER — Other Ambulatory Visit: Payer: Self-pay | Admitting: Family Medicine

## 2013-12-08 NOTE — Telephone Encounter (Signed)
Med filled.  

## 2013-12-18 ENCOUNTER — Encounter: Payer: Medicare Other | Admitting: Family Medicine

## 2014-03-31 LAB — HM DIABETES EYE EXAM

## 2014-04-01 LAB — HM DIABETES EYE EXAM

## 2014-04-06 ENCOUNTER — Other Ambulatory Visit: Payer: Self-pay | Admitting: General Practice

## 2014-04-06 MED ORDER — LISINOPRIL-HYDROCHLOROTHIAZIDE 20-25 MG PO TABS: ORAL_TABLET | ORAL | Status: DC

## 2014-04-08 ENCOUNTER — Encounter: Payer: Self-pay | Admitting: General Practice

## 2014-04-20 ENCOUNTER — Encounter: Payer: Self-pay | Admitting: Family Medicine

## 2014-04-21 ENCOUNTER — Ambulatory Visit (INDEPENDENT_AMBULATORY_CARE_PROVIDER_SITE_OTHER): Payer: Medicare Other | Admitting: Family Medicine

## 2014-04-21 ENCOUNTER — Ambulatory Visit (HOSPITAL_BASED_OUTPATIENT_CLINIC_OR_DEPARTMENT_OTHER)
Admission: RE | Admit: 2014-04-21 | Discharge: 2014-04-21 | Disposition: A | Discharge status: Home / Self Care | Payer: Medicare Other | Source: Ambulatory Visit | Attending: Family Medicine | Admitting: Family Medicine

## 2014-04-21 ENCOUNTER — Encounter: Payer: Self-pay | Admitting: Family Medicine

## 2014-04-21 ENCOUNTER — Telehealth: Payer: Self-pay | Admitting: Internal Medicine

## 2014-04-21 VITALS — BP 126/78 | HR 62 | Temp 98.1°F | Resp 16 | Ht 66.0 in | Wt 200.1 lb

## 2014-04-21 DIAGNOSIS — I4891 Unspecified atrial fibrillation: Secondary | ICD-10-CM

## 2014-04-21 DIAGNOSIS — Z1231 Encounter for screening mammogram for malignant neoplasm of breast: Secondary | ICD-10-CM

## 2014-04-21 DIAGNOSIS — J4540 Moderate persistent asthma, uncomplicated: Secondary | ICD-10-CM

## 2014-04-21 DIAGNOSIS — E1140 Type 2 diabetes mellitus with diabetic neuropathy, unspecified: Secondary | ICD-10-CM | POA: Diagnosis not present

## 2014-04-21 DIAGNOSIS — Z0000 Encounter for general adult medical examination without abnormal findings: Secondary | ICD-10-CM | POA: Diagnosis not present

## 2014-04-21 DIAGNOSIS — E119 Type 2 diabetes mellitus without complications: Secondary | ICD-10-CM | POA: Diagnosis not present

## 2014-04-21 DIAGNOSIS — I480 Paroxysmal atrial fibrillation: Secondary | ICD-10-CM | POA: Insufficient documentation

## 2014-04-21 DIAGNOSIS — E2839 Other primary ovarian failure: Secondary | ICD-10-CM

## 2014-04-21 DIAGNOSIS — R0602 Shortness of breath: Secondary | ICD-10-CM | POA: Diagnosis not present

## 2014-04-21 DIAGNOSIS — I10 Essential (primary) hypertension: Secondary | ICD-10-CM | POA: Diagnosis not present

## 2014-04-21 LAB — LIPID PANEL
Cholesterol: 164 mg/dL (ref 0–200)
HDL: 61 mg/dL (ref >39.00)
LDL Cholesterol: 94 mg/dL (ref 0–99)
NonHDL: 103
TRIGLYCERIDES: 45 mg/dL (ref 0.0–149.0)
Total CHOL/HDL Ratio: 3
VLDL: 9 mg/dL (ref 0.0–40.0)

## 2014-04-21 LAB — CBC WITH DIFFERENTIAL/PLATELET
Basophils Absolute: 0 10*3/uL (ref 0.0–0.1)
Basophils Relative: 0.4 % (ref 0.0–3.0)
Eosinophils Absolute: 0.4 10*3/uL (ref 0.0–0.7)
Eosinophils Relative: 6.7 % — ABNORMAL HIGH (ref 0.0–5.0)
HEMATOCRIT: 38.6 % (ref 36.0–46.0)
Hemoglobin: 12.3 g/dL (ref 12.0–15.0)
Lymphocytes Relative: 22.6 % (ref 12.0–46.0)
Lymphs Abs: 1.4 10*3/uL (ref 0.7–4.0)
MCHC: 31.8 g/dL (ref 30.0–36.0)
MCV: 92.9 fl (ref 78.0–100.0)
MONOS PCT: 4.1 % (ref 3.0–12.0)
Monocytes Absolute: 0.3 10*3/uL (ref 0.1–1.0)
NEUTROS PCT: 66.2 % (ref 43.0–77.0)
Neutro Abs: 4.1 10*3/uL (ref 1.4–7.7)
Platelets: 193 10*3/uL (ref 150.0–400.0)
RBC: 4.16 Mil/uL (ref 3.87–5.11)
RDW: 15.6 % — ABNORMAL HIGH (ref 11.5–15.5)
WBC: 6.2 10*3/uL (ref 4.0–10.5)

## 2014-04-21 LAB — BASIC METABOLIC PANEL
BUN: 17 mg/dL (ref 6–23)
CO2: 25 mEq/L (ref 19–32)
Calcium: 9.9 mg/dL (ref 8.4–10.5)
Chloride: 103 mEq/L (ref 96–112)
Creatinine, Ser: 0.7 mg/dL (ref 0.4–1.2)
GFR: 89.83 mL/min (ref >60.00)
GLUCOSE: 127 mg/dL — AB (ref 70–99)
Potassium: 4.6 mEq/L (ref 3.5–5.1)
SODIUM: 138 meq/L (ref 135–145)

## 2014-04-21 LAB — HEPATIC FUNCTION PANEL
ALBUMIN: 4.4 g/dL (ref 3.5–5.2)
ALT: 18 U/L (ref 0–35)
AST: 17 U/L (ref 0–37)
Alkaline Phosphatase: 69 U/L (ref 39–117)
BILIRUBIN DIRECT: 0.1 mg/dL (ref 0.0–0.3)
BILIRUBIN TOTAL: 0.5 mg/dL (ref 0.2–1.2)
TOTAL PROTEIN: 7.6 g/dL (ref 6.0–8.3)

## 2014-04-21 LAB — TSH: TSH: 1.5 u[IU]/mL (ref 0.35–4.50)

## 2014-04-21 LAB — HEMOGLOBIN A1C: HEMOGLOBIN A1C: 7.7 % — AB (ref 4.6–6.5)

## 2014-04-21 MED ORDER — RIVAROXABAN 20 MG PO TABS: 20.0000 mg | ORAL_TABLET | Freq: Every day | ORAL | Status: DC

## 2014-04-21 MED ORDER — ESTROGENS, CONJUGATED 0.625 MG/GM VA CREA: TOPICAL_CREAM | Freq: Every day | VAGINAL | Status: DC

## 2014-04-21 NOTE — Progress Notes (Signed)
Subjective:    Patient ID: Carly Buckley, female    DOB: 10/23/1945, 69 y.o.   MRN: [id number]  HPI Here today for CPE.  Risk Factors: HTN- chronic problem, on Lisinopril HCTZ, Amlodipine. DM- chronic problem, on Amaryl, Metformin.  UTD on eye exam.  On ACE for renal protection. Atrophic vaginitis- chronic problem, has used premarin cream in the past but has been off of this.  Pt interested in restarting. 'funny feeling' in chest- comes and goes.  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  No difficulty swallowing.  Denies SOB.  'i just feel it'.  Unable to explain what she's feeling.  'cancer runs real bad in my family, don't you think I should check it out?'  'if i think about it, i feel it'.  Reports it can occur 'several times a day'. Physical Activity: pt has been walking regularly Fall Risk: low Depression: denies current sxs Hearing: normal to conversational tones and whispered voice at 6 ft ADL's: independent Cognitive: normal linear thought process, memory and attention intact Home Safety: safe at home, lives w/ husband Height, Weight, BMI, Visual Acuity: see vitals, vision corrected to 20/20 w/ glasses Counseling: UTD on colonoscopy, eye exam.  Due for mammo and DEXA.  No need for paps due to age.  Pt declines flu shot, willing to get Pneumonia shot.  Overdue on tetanus but not willing 'unless i cut myself'. Labs Ordered: See A&P Care Plan: See A&P    Review of Systems Patient reports no vision/ hearing changes, adenopathy,fever, weight change,  persistant/recurrent hoarseness , swallowing issues, chest pain, edema, persistant/recurrent cough, hemoptysis, dyspnea (rest/exertional/paroxysmal nocturnal), gastrointestinal bleeding (melena, rectal bleeding), abdominal pain, significant heartburn, bowel changes, GU symptoms (dysuria, hematuria, incontinence), Gyn symptoms (abnormal  bleeding, pain),  syncope, focal weakness, memory loss, skin/hair/nail changes,  abnormal bruising or bleeding, anxiety, or depression.   + intermittent palpitations + neuropathy- occurred after chemo    Objective:   Physical Exam General Appearance:    Alert, cooperative, no distress, appears stated age  Head:    Normocephalic, without obvious abnormality, atraumatic  Eyes:    PERRL, conjunctiva/corneas clear, EOM's intact, fundi    benign, both eyes  Ears:    Normal TM's and external ear canals, both ears  Nose:   Nares normal, septum midline, mucosa normal, no drainage    or sinus tenderness  Throat:   Lips, mucosa, and tongue normal; teeth and gums normal  Neck:   Supple, symmetrical, trachea midline, no adenopathy;    Thyroid: no enlargement/tenderness/nodules  Back:     Symmetric, no curvature, ROM normal, no CVA tenderness  Lungs:     Clear to auscultation bilaterally, respirations unlabored  Chest Wall:    No tenderness or deformity   Heart:    Regular rate and rhythm, S1 and S2 normal, no murmur, rub   or gallop  Breast Exam:    Deferred to mammo  Abdomen:     Soft, non-tender, bowel sounds active all four quadrants,    no masses, no organomegaly  Genitalia:    Deferred at pt request  Rectal:    Extremities:   Extremities normal, atraumatic, no cyanosis or edema  Pulses:   2+ and symmetric all extremities  Skin:   Skin color, texture, turgor normal, no rashes or lesions  Lymph nodes:   Cervical, supraclavicular, and axillary nodes normal  Neurologic:   CNII-XII intact, normal strength, sensation and reflexes    throughout  Assessment & Plan:

## 2014-04-21 NOTE — Telephone Encounter (Signed)
Received a call from [place] at Dr.Tabori's office.Stated patient was in their office this morning for a wellness check up.Stated she was complaining about funny feeling in chest at times.EKG was done.Stated Dr.Tabori wanted cardiologist to review.Advised to fax EKG and will have DOD Dr.Hilty to review.

## 2014-04-21 NOTE — Patient Instructions (Signed)
Follow up in 3-4 months to recheck diabetes Go downstairs and get your chest xray We'll notify you of your lab results and make any changes if needed We'll call you with you mammo and bone density appts Keep up the good work with healthy food choices and regular exercise Call with any questions or concerns Happy New Year!

## 2014-04-21 NOTE — Progress Notes (Signed)
Pre visit review using our clinic review tool, if applicable. No additional management support is needed unless otherwise documented below in the visit note. 

## 2014-04-22 ENCOUNTER — Encounter: Payer: Self-pay | Admitting: General Practice

## 2014-04-25 NOTE — Assessment & Plan Note (Signed)
Chronic problem.  UTD on eye exam.  On ACE.  Check labs.  Adjust meds prn

## 2014-04-25 NOTE — Assessment & Plan Note (Signed)
Pt's PE unchanged from previous.  UTD on colonoscopy.  No need for paps.  Due for mammo and DEXA- these were ordered today.  Written screening schedule updated and given to pt.  Pt declines Td.  Check labs.  Anticipatory guidance provided.

## 2014-04-25 NOTE — Assessment & Plan Note (Signed)
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  Due to 'funny feeling' will get CXR to assess.

## 2014-04-25 NOTE — Assessment & Plan Note (Signed)
Chronic problem.  Adequate control.  Asymptomatic w/ exception of intermittent palpitations which is now known to be Afib.  Check labs.  No anticipated med changes at this time.

## 2014-04-25 NOTE — Assessment & Plan Note (Signed)
New.  Pt is asymptomatic in office but suspect this may be the cause of the 'funny feeling' she is having and definitely the cause of her palpitations.  Based on info provided by cardiology, will start pt on Xarelto 20mg  daily, stop ASA.  No need for rate control at this time as pt's pulse WNL.  Pt to f/u w/ cards in 3-4 weeks.  Pt aware of dx and plan.

## 2014-04-26 ENCOUNTER — Telehealth: Payer: Self-pay | Admitting: Family Medicine

## 2014-04-26 NOTE — Telephone Encounter (Signed)
EKG reviewed by Dr.Hilty 04/21/14.Dr.Hilty called Dr.Tabori.

## 2014-04-26 NOTE — Telephone Encounter (Signed)
xarelto is the safest option and greatly reduces the risk of stroke in the setting of Afib.  She needs to be on this for at least 3 weeks when she sees cardiology so that if they need to do a procedure to get her heart back in rhythm, they are able to do it.  I would STRONGLY recommend taking medication as directed (this is my, and cardiology's, recommendation)

## 2014-04-26 NOTE — Telephone Encounter (Signed)
Caller name: Aubriee Relation to pt: self  Call back number:  [phone] Pharmacy:  Reason for call:   Patient states that she refuses to take xarelto. She states that she does not like the side effects and would like to be prescribed something else.

## 2014-04-26 NOTE — Telephone Encounter (Signed)
Spoke with pt and advised that she needs to be on a blood thinner. Pt was not happy with this and said she was worried due to what was said on the internet and there being a lawsuit against Xarelto.

## 2014-04-28 NOTE — Telephone Encounter (Signed)
Pt states that she was not aware that Dr. Birdie Riddle sent a Rx to [facility] on [address].  Pt was made aware that Rx was sent for her to pick up after her samples run out.  Pt stated understanding.  No furthers needs voiced at this time.

## 2014-04-28 NOTE — Telephone Encounter (Signed)
Can you please triage this pt in regards to this blood thinner.

## 2014-04-28 NOTE — Telephone Encounter (Signed)
Pt would like for you to call her again states she has questions again about her blood thinner she has to take

## 2014-05-24 ENCOUNTER — Encounter: Payer: Self-pay | Admitting: Internal Medicine

## 2014-05-24 ENCOUNTER — Ambulatory Visit (INDEPENDENT_AMBULATORY_CARE_PROVIDER_SITE_OTHER): Payer: Medicare Other | Admitting: Internal Medicine

## 2014-05-24 VITALS — BP 138/60 | HR 106 | Ht 65.5 in | Wt 194.9 lb

## 2014-05-24 DIAGNOSIS — R9431 Abnormal electrocardiogram [ECG] [EKG]: Secondary | ICD-10-CM

## 2014-05-24 DIAGNOSIS — E119 Type 2 diabetes mellitus without complications: Secondary | ICD-10-CM | POA: Diagnosis not present

## 2014-05-24 DIAGNOSIS — R000 Tachycardia, unspecified: Secondary | ICD-10-CM

## 2014-05-24 DIAGNOSIS — R0609 Other forms of dyspnea: Secondary | ICD-10-CM | POA: Insufficient documentation

## 2014-05-24 DIAGNOSIS — I4891 Unspecified atrial fibrillation: Secondary | ICD-10-CM | POA: Diagnosis not present

## 2014-05-24 DIAGNOSIS — I10 Essential (primary) hypertension: Secondary | ICD-10-CM | POA: Diagnosis not present

## 2014-05-24 HISTORY — DX: Other forms of dyspnea: R06.09

## 2014-05-24 HISTORY — DX: Abnormal electrocardiogram (ECG) (EKG): R94.31

## 2014-05-24 MED ORDER — METOPROLOL SUCCINATE ER 25 MG PO TB24: 25.0000 mg | ORAL_TABLET | Freq: Every day | ORAL | Status: DC

## 2014-05-24 NOTE — Patient Instructions (Signed)
Your physician has recommended you make the following change in your medication: START metoprolol succinate 25mg  once daily   Your physician has requested that you have a lexiscan myoview. For further information please visit [email or web address]. Please follow instruction sheet, as given.  Your physician recommends that you schedule a follow-up appointment after your tests with Dr. Debara Pickett.

## 2014-05-24 NOTE — Progress Notes (Signed)
OFFICE NOTE  Chief Complaint:  Palpitaitons  Primary Care Physician: Annye Asa, MD  HPI:  Carly Buckley  Is a pleasant 69 year old female kindly referred to me by Dr. Alvester Morin for palpitations. She recently saw her in the office for a routine physical and was found to have atrial fibrillation. She does have a history of palpitations has gone on for number of years.  Over the phone I recommended she start on Xarelto  And she's been taking that medication since we spoke about a month ago. She  Presents today for formal cardiac evaluation. Her EKG demonstrates a sinus tachycardia with Q waves in 3 and aVF as well as poor anterior R-wave progression out until lead V6 concerning for possible anterolateral infarct and inferior infarct.  She denies any history of known coronary disease. Apparently in the past she been evaluated by Dr. Cathie Olden  And has stress testing in number of years ago which was negative. She denies any chest pain but does report shortness of breath with exertion and intermittent palpitations. She also has rest tachycardia.  PMHx:  Past Medical History  Diagnosis Date  . Colon cancer     colon ca dx 07  . Diabetes mellitus   . Hypertension   . Malignant neoplasm of colon   . Neuropathy, idiopathic   . Type II diabetes mellitus without mention of complication   . Helicobacter pylori (H. pylori)   . Diverticulitis   . Colon cancer 2008  . Arthritis   . History of colon cancer 2007    colon resection  . Cough 07/24/12    productive, ? color, no fever, congested   . Dysrhythmia     palpitations  . Complication of anesthesia   . PONV (postoperative nausea and vomiting)     Past Surgical History  Procedure Laterality Date  . Colon resection  2007    colon cancer  . Tubal ligation    . Tonsillectomy    . Total knee arthroplasty Right 08/04/2012    Procedure: TOTAL KNEE ARTHROPLASTY;  Surgeon: Gearlean Alf, MD;  Location: [facility];  Service: Orthopedics;   Laterality: Right;  . Steriod injection Left 08/04/2012    Procedure: STEROID INJECTION;  Surgeon: Gearlean Alf, MD;  Location: [facility];  Service: Orthopedics;  Laterality: Left;    FAMHx:  Family History  Problem Relation Age of Onset  . Diabetes Sister   . Diabetes Brother   . Brain cancer Daughter   . Lung cancer Mother 63  . Kidney failure Father 66    SOCHx:   reports that she quit smoking about 41 years ago. She has never used smokeless tobacco. She reports that she does not drink alcohol or use illicit drugs.  ALLERGIES:  Allergies  Allergen Reactions  . Morphine And Related     Nauseated   . Oxycodone Nausea Only    ROS: A comprehensive review of systems was negative except for: Respiratory: positive for dyspnea on exertion Cardiovascular: positive for irregular heart beat and palpitations  HOME MEDS: Current Outpatient Prescriptions  Medication Sig Dispense Refill  . albuterol (PROAIR HFA) 108 (90 BASE) MCG/ACT inhaler Inhale 2 puffs into the lungs every 4 (four) hours as needed for wheezing or shortness of breath. 1 Inhaler 6  . amLODipine (NORVASC) 10 MG tablet TAKE ONE TABLET BY MOUTH ONCE DAILY 30 tablet 6  . cetirizine (ZYRTEC) 10 MG tablet Take 1 tablet (10 mg total) by mouth daily. 30 tablet  11  . conjugated estrogens (PREMARIN) vaginal cream Place vaginally daily. 0.5-1 gram vaginally 42.5 g 11  . glimepiride (AMARYL) 4 MG tablet TAKE TWO TABLETS BY MOUTH ONCE DAILY 60 tablet 6  . lisinopril-hydrochlorothiazide (PRINZIDE,ZESTORETIC) 20-25 MG per tablet TAKE ONE TABLET BY MOUTH ONCE DAILY BEFORE BREAKFAST 30 tablet 3  . metFORMIN (GLUCOPHAGE) 500 MG tablet TAKE ONE TABLET BY MOUTH ONCE DAILY AFTER BREAKFAST 30 tablet 6  . nystatin-triamcinolone ointment (MYCOLOG) APPLY 1 APPLICATION  TOPICALLY TO STOMACH TWICE DAILY AS NEEDED FOR  RASH 30 g 3  . ONE TOUCH ULTRA TEST test strip USE AS DIRECTED TO TEST BLOOD SUGAR 100 each 2  . Propylene Glycol (SYSTANE  BALANCE) 0.6 % SOLN Place 1 drop into both eyes daily as needed (for dry eyes).    . rivaroxaban (XARELTO) 20 MG TABS tablet Take 1 tablet (20 mg total) by mouth daily with supper. 30 tablet 3  . metoprolol succinate (TOPROL-XL) 25 MG 24 hr tablet Take 1 tablet (25 mg total) by mouth daily. 30 tablet 6   No current facility-administered medications for this visit.    LABS/IMAGING: No results found for this or any previous visit (from the past 48 hour(s)). No results found.  VITALS: BP 138/60 mmHg  Pulse 106  Ht 5' 5.5" (1.664 m)  Wt 194 lb 14.4 oz (88.406 kg)  BMI 31.93 kg/m2  EXAM: General appearance: alert and no distress Neck: no carotid bruit and no JVD Lungs: clear to auscultation bilaterally Heart: Regular tachycardia, no murmur Abdomen: soft, non-tender; bowel sounds normal; no masses,  no organomegaly Extremities: extremities normal, atraumatic, no cyanosis or edema Pulses: 2+ and symmetric Skin: Skin color, texture, turgor normal. No rashes or lesions Neurologic: Grossly normal Psych: Pleasant, appears nervous  EKG: Sinus tachycardia at 106, inferior and anterolateral Q waves concerning for infarct  ASSESSMENT: 1. Paroxysmal atrial fibrillation 2. Anticoagulation on Xarelto for CHADSVASC score of 4 3. HTN 4. DM2 5. Abnormal EKG concerning for prior infarct  PLAN: 1.   [name] has new onset paroxysmal atrial fibrillation. She is tolerating Xarelto without any bleeding issues. Heart rate remains fast and therefore like to start her on low-dose metoprolol succinate 25 mg daily. There apparently is blood pressure room to tolerate this without needing to decrease her amlodipine. Blood pressure is fairly well-controlled. Diabetes is followed by her primary care provider. I would estimate her stroke risk between 5 and 7% per year therefore anticoagulation is recommended. Her EKG is abnormal demonstrating what appear to be inferior and anterolateral Q waves concerning for  possible prior infarct. I would recommend stress testing to rule out ischemia and evaluate for any prior scar. She has had some shortness of breath with exertion which could be due to some degree of cardiomyopathy. Since she is in sinus rhythm today, she will not need cardioversion.  Plan to see her back to discuss results of her stress test and see if she's tolerating her medications in a few weeks. Thanks again for the consultation.  Carly Casino, MD, [facility] Attending Cardiologist [facility]  Carly Buckley 05/24/2014, 4:04 PM

## 2014-06-02 ENCOUNTER — Telehealth (HOSPITAL_COMMUNITY): Payer: Self-pay

## 2014-06-02 NOTE — Telephone Encounter (Signed)
Encounter complete. 

## 2014-06-03 ENCOUNTER — Telehealth (HOSPITAL_COMMUNITY): Payer: Self-pay

## 2014-06-03 NOTE — Telephone Encounter (Signed)
Encounter complete. 

## 2014-06-04 ENCOUNTER — Ambulatory Visit (HOSPITAL_COMMUNITY)
Admission: RE | Admit: 2014-06-04 | Discharge: 2014-06-04 | Disposition: A | Discharge status: Home / Self Care | Payer: Medicare Other | Source: Ambulatory Visit | Attending: Cardiology | Admitting: Cardiology

## 2014-06-04 DIAGNOSIS — I4891 Unspecified atrial fibrillation: Secondary | ICD-10-CM | POA: Diagnosis not present

## 2014-06-04 DIAGNOSIS — R9431 Abnormal electrocardiogram [ECG] [EKG]: Secondary | ICD-10-CM | POA: Diagnosis not present

## 2014-06-04 DIAGNOSIS — E663 Overweight: Secondary | ICD-10-CM | POA: Insufficient documentation

## 2014-06-04 DIAGNOSIS — R002 Palpitations: Secondary | ICD-10-CM | POA: Insufficient documentation

## 2014-06-04 DIAGNOSIS — Z87891 Personal history of nicotine dependence: Secondary | ICD-10-CM | POA: Diagnosis not present

## 2014-06-04 DIAGNOSIS — E119 Type 2 diabetes mellitus without complications: Secondary | ICD-10-CM | POA: Diagnosis not present

## 2014-06-04 DIAGNOSIS — I10 Essential (primary) hypertension: Secondary | ICD-10-CM | POA: Insufficient documentation

## 2014-06-04 DIAGNOSIS — R0609 Other forms of dyspnea: Secondary | ICD-10-CM | POA: Diagnosis not present

## 2014-06-04 MED ORDER — REGADENOSON 0.4 MG/5ML IV SOLN
0.4000 mg | Freq: Once | INTRAVENOUS | Status: AC
  Administered 2014-06-04: 0.4 mg via INTRAVENOUS

## 2014-06-04 MED ORDER — TECHNETIUM TC 99M SESTAMIBI GENERIC - CARDIOLITE
29.1000 | Freq: Once | INTRAVENOUS | Status: AC | PRN
  Administered 2014-06-04: 29.1 via INTRAVENOUS

## 2014-06-04 MED ORDER — TECHNETIUM TC 99M SESTAMIBI GENERIC - CARDIOLITE
10.4000 | Freq: Once | INTRAVENOUS | Status: AC | PRN
  Administered 2014-06-04: 10 via INTRAVENOUS

## 2014-06-04 NOTE — Telephone Encounter (Signed)
Encounter complete. 

## 2014-06-04 NOTE — Procedures (Addendum)
[facility] [facility] [address] [place] [address] [phone]  Cardiology Nuclear Med Study  Carly Buckley is a 69 y.o. female     MRN : [id number]     DOB: [birth year]  Procedure Date: 06/04/2014  Nuclear Med Background Indication for Stress Test:  Evaluation for Ischemia and Abnormal EKG History:  AFIB;PALPITATIONS;No prior respiratory history reported;No prior NUC MPI for comparison. Cardiac Risk Factors: History of Smoking, Hypertension, NIDDM and Overweight  Symptoms:  DOE and Palpitations   Nuclear Pre-Procedure Caffeine/Decaff Intake:  12:00am NPO After: 10am   IV Site: R Forearm  IV 0.9% NS with Angio Cath:  22g  Chest Size (in):  n/a IV Started by: Rolene Course, RN  Height: 5\' 6"  (1.676 m)  Cup Size: C  BMI:  Body mass index is 31.33 kg/(m^2). Weight:  194 lb (87.998 kg)   Tech Comments:  n/a    Nuclear Med Study 1 or 2 day study: 1 day  Stress Test Type:  Center Provider:  Lyman Bishop, MD   Resting Radionuclide: Technetium 82m Sestamibi  Resting Radionuclide Dose: 10.4 mCi   Stress Radionuclide:  Technetium 56m Sestamibi  Stress Radionuclide Dose: 29.1 mCi           Stress Protocol Rest HR: 76 Stress HR: 85  Rest BP: 133/63 Stress BP: 133/71  Exercise Time (min): n/a METS: n/a   Predicted Max HR: 152 bpm % Max HR: 64.47 bpm Rate Pressure Product: 13034  Dose of Adenosine (mg):  n/a Dose of Lexiscan: 0.4 mg  Dose of Atropine (mg): n/a Dose of Dobutamine: n/a mcg/kg/min (at max HR)  Stress Test Technologist: Leane Para, CCT Nuclear Technologist: Imagene Riches, CNMT   Rest Procedure:  Myocardial perfusion imaging was performed at rest 45 minutes following the intravenous administration of Technetium 56m Sestamibi. Stress Procedure:  The patient received IV Lexiscan 0.4 mg over 15-seconds.  Technetium 69m Sestamibi injected IV at 30-seconds.  There were no significant changes  with Lexiscan.  Quantitative spect images were obtained after a 45 minute delay.  Transient Ischemic Dilatation (Normal <1.22):  1.45  QGS EDV:  77 ml QGS ESV:  25 ml LV Ejection Fraction: 68%     Rest ECG: NSR, first degree AV block, septal MI.  Stress ECG: No significant ST segment change suggestive of ischemia.  QPS Raw Data Images:  Acquisition technically good; normal left ventricular size. Stress Images:  There is decreased uptake in the apex. Rest Images:  There is decreased uptake in the apex, less prominent compared to the stress images. Subtraction (SDS):  These findings are consistent with apical thinning and mild apical ischemia.  Impression Exercise Capacity:  Lexiscan with no exercise. BP Response:  Normal blood pressure response. Clinical Symptoms:  No significant symptoms noted. ECG Impression:  No significant ST segment change suggestive of ischemia. Comparison with Prior Nuclear Study: No previous nuclear study performed  Overall Impression:  Low risk stress nuclear study with a small, severe intensity, partially reversible apical defect consistent with apical thinning and mild apical ischemia.  LV Wall Motion:  NL LV Function; NL Wall Motion   Kirk Ruths, MD  06/04/2014 4:57 PM

## 2014-06-08 ENCOUNTER — Other Ambulatory Visit: Payer: Self-pay | Admitting: General Practice

## 2014-06-08 MED ORDER — AMLODIPINE BESYLATE 10 MG PO TABS: 10.0000 mg | ORAL_TABLET | Freq: Every day | ORAL | Status: DC

## 2014-06-15 ENCOUNTER — Telehealth: Payer: Self-pay | Admitting: Family Medicine

## 2014-06-15 NOTE — Telephone Encounter (Signed)
Left a message for call back.  Samples placed up front for pick up.

## 2014-06-15 NOTE — Telephone Encounter (Signed)
PA initiated

## 2014-06-15 NOTE — Telephone Encounter (Signed)
Sent to [facility] to work on [place]

## 2014-06-15 NOTE — Telephone Encounter (Signed)
Please give pt samples of 20mg  xarelto

## 2014-06-15 NOTE — Telephone Encounter (Signed)
Ok to give samples of Xarelto until PA is completed?

## 2014-06-15 NOTE — Telephone Encounter (Signed)
Caller name: Ale[place], [place] Relation to pt: self  Call back number: [phone] Pharmacy: [facility] [address] ([address]),  - [place] [phone] (Phone) [phone] (Fax)        Reason for call:  Patient wanted to inform MD she is completely out of rivaroxaban (XARELTO) 20 MG TABS tablet . Pt states she needs prior auth please advise.

## 2014-06-18 ENCOUNTER — Ambulatory Visit (INDEPENDENT_AMBULATORY_CARE_PROVIDER_SITE_OTHER): Payer: Medicare Other | Admitting: Internal Medicine

## 2014-06-18 ENCOUNTER — Encounter: Payer: Self-pay | Admitting: Internal Medicine

## 2014-06-18 VITALS — BP 148/70 | HR 96 | Ht 65.5 in | Wt 196.3 lb

## 2014-06-18 DIAGNOSIS — R9431 Abnormal electrocardiogram [ECG] [EKG]: Secondary | ICD-10-CM

## 2014-06-18 DIAGNOSIS — R0609 Other forms of dyspnea: Secondary | ICD-10-CM | POA: Diagnosis not present

## 2014-06-18 DIAGNOSIS — I480 Paroxysmal atrial fibrillation: Secondary | ICD-10-CM | POA: Diagnosis not present

## 2014-06-18 NOTE — Patient Instructions (Addendum)
Your physician wants you to follow-up in: 6 months with Dr. Debara Pickett. You will receive a reminder letter in the mail two months in advance. If you don't receive a letter, please call our office to schedule the follow-up appointment.   Xarelto 20mg  samples provided #25 15MG 922 Exp 09/2016

## 2014-06-18 NOTE — Progress Notes (Signed)
OFFICE NOTE  Chief Complaint:  Follow-up stress test  Primary Care Physician: Annye Asa, MD  HPI:  Carly Buckley  Is a pleasant 69 year old female kindly referred to me by Dr. Alvester Morin for palpitations. She recently saw her in the office for a routine physical and was found to have atrial fibrillation. She does have a history of palpitations has gone on for number of years.  Over the phone I recommended she start on Xarelto  And she's been taking that medication since we spoke about a month ago. She  Presents today for formal cardiac evaluation. Her EKG demonstrates a sinus tachycardia with Q waves in 3 and aVF as well as poor anterior R-wave progression out until lead V6 concerning for possible anterolateral infarct and inferior infarct.  She denies any history of known coronary disease. Apparently in the past she been evaluated by Dr. Cathie Olden  And has stress testing in number of years ago which was negative. She denies any chest pain but does report shortness of breath with exertion and intermittent palpitations. She also has rest tachycardia.  I had the pleasure see Mrs. Masse back in the office today. She reports some improvement in her shortness of breath. Her stress test was negative and EF was preserved at 68%. She reports no further significant palpitations. She's having no bleeding problems on Xarelto. She started walking and feels that she's had improved energy and shortness of breath. I have encouraged her to continue to walk.  PMHx:  Past Medical History  Diagnosis Date  . Colon cancer     colon ca dx 07  . Diabetes mellitus   . Hypertension   . Malignant neoplasm of colon   . Neuropathy, idiopathic   . Type II diabetes mellitus without mention of complication   . Helicobacter pylori (H. pylori)   . Diverticulitis   . Colon cancer 2008  . Arthritis   . History of colon cancer 2007    colon resection  . Cough 07/24/12    productive, ? color, no fever, congested     . Dysrhythmia     palpitations  . Complication of anesthesia   . PONV (postoperative nausea and vomiting)     Past Surgical History  Procedure Laterality Date  . Colon resection  2007    colon cancer  . Tubal ligation    . Tonsillectomy    . Total knee arthroplasty Right 08/04/2012    Procedure: TOTAL KNEE ARTHROPLASTY;  Surgeon: Gearlean Alf, MD;  Location: [facility];  Service: Orthopedics;  Laterality: Right;  . Steriod injection Left 08/04/2012    Procedure: STEROID INJECTION;  Surgeon: Gearlean Alf, MD;  Location: [facility];  Service: Orthopedics;  Laterality: Left;    FAMHx:  Family History  Problem Relation Age of Onset  . Diabetes Sister   . Diabetes Brother   . Brain cancer Daughter   . Lung cancer Mother 32  . Kidney failure Father 70    SOCHx:   reports that she quit smoking about 42 years ago. She has never used smokeless tobacco. She reports that she does not drink alcohol or use illicit drugs.  ALLERGIES:  Allergies  Allergen Reactions  . Morphine And Related     Nauseated   . Oxycodone Nausea Only    ROS: A comprehensive review of systems was negative.  HOME MEDS: Current Outpatient Prescriptions  Medication Sig Dispense Refill  . albuterol (PROAIR HFA) 108 (90 BASE) MCG/ACT inhaler Inhale  2 puffs into the lungs every 4 (four) hours as needed for wheezing or shortness of breath. 1 Inhaler 6  . amLODipine (NORVASC) 10 MG tablet Take 1 tablet (10 mg total) by mouth daily. 30 tablet 6  . conjugated estrogens (PREMARIN) vaginal cream Place vaginally daily. 0.5-1 gram vaginally 42.5 g 11  . glimepiride (AMARYL) 4 MG tablet TAKE TWO TABLETS BY MOUTH ONCE DAILY 60 tablet 6  . lisinopril-hydrochlorothiazide (PRINZIDE,ZESTORETIC) 20-25 MG per tablet TAKE ONE TABLET BY MOUTH ONCE DAILY BEFORE BREAKFAST 30 tablet 3  . metFORMIN (GLUCOPHAGE) 500 MG tablet TAKE ONE TABLET BY MOUTH ONCE DAILY AFTER BREAKFAST 30 tablet 6  . metoprolol succinate (TOPROL-XL) 25 MG 24  hr tablet Take 1 tablet (25 mg total) by mouth daily. 30 tablet 6  . nystatin-triamcinolone ointment (MYCOLOG) APPLY 1 APPLICATION  TOPICALLY TO STOMACH TWICE DAILY AS NEEDED FOR  RASH 30 g 3  . ONE TOUCH ULTRA TEST test strip USE AS DIRECTED TO TEST BLOOD SUGAR 100 each 2  . Propylene Glycol (SYSTANE BALANCE) 0.6 % SOLN Place 1 drop into both eyes daily as needed (for dry eyes).    . rivaroxaban (XARELTO) 20 MG TABS tablet Take 1 tablet (20 mg total) by mouth daily with supper. 30 tablet 3   No current facility-administered medications for this visit.    LABS/IMAGING: No results found for this or any previous visit (from the past 48 hour(s)). No results found.  VITALS: BP 148/70 mmHg  Pulse 96  Ht 5' 5.5" (1.664 m)  Wt 196 lb 4.8 oz (89.041 kg)  BMI 32.16 kg/m2  EXAM: deferred  EKG: deferred  ASSESSMENT: 1. Paroxysmal atrial fibrillation 2. Anticoagulation on Xarelto for CHADSVASC score of 4 3. HTN 4. DM2 5. Abnormal EKG concerning for prior infarct - low risk nuclear stress test with EF 68%  PLAN: 1.   Mrs. Pacholski has done well on Xarelto. I increased her Toprol last time for tachycardia and heart rate appears to be better controlled. She denies any significant shortness of breath and started walking and which she feels better. There is been no bleeding abnormalities. She is a little concerned about the cost of Xarelto, but when we discussed warfarin and vomiting associated with that she feels that she rather stay on the medicine. Blood pressure is well-controlled today. I've encouraged her to continue to work on exercise. Plan to see her back in 6 months.  Pixie Casino, MD, [facility] Attending Cardiologist [facility]  Jammy Stlouis C 06/18/2014, 10:30 AM

## 2014-06-18 NOTE — Telephone Encounter (Signed)
Patient called back stating that she did not want the samples.

## 2014-06-21 ENCOUNTER — Other Ambulatory Visit: Payer: Self-pay | Admitting: General Practice

## 2014-06-21 MED ORDER — GLIMEPIRIDE 4 MG PO TABS: 8.0000 mg | ORAL_TABLET | Freq: Every day | ORAL | Status: DC

## 2014-07-28 ENCOUNTER — Ambulatory Visit: Payer: Medicare Other

## 2014-07-28 ENCOUNTER — Other Ambulatory Visit: Payer: Medicare Other

## 2014-08-09 ENCOUNTER — Other Ambulatory Visit: Payer: Self-pay | Admitting: Family Medicine

## 2014-08-09 NOTE — Telephone Encounter (Signed)
Med filled.  

## 2014-08-19 ENCOUNTER — Ambulatory Visit (INDEPENDENT_AMBULATORY_CARE_PROVIDER_SITE_OTHER): Payer: Medicare Other | Admitting: Family Medicine

## 2014-08-19 ENCOUNTER — Ambulatory Visit: Payer: Medicare Other | Admitting: Family Medicine

## 2014-08-19 ENCOUNTER — Encounter: Payer: Self-pay | Admitting: Family Medicine

## 2014-08-19 VITALS — BP 126/80 | HR 80 | Temp 97.9°F | Resp 16 | Wt 196.4 lb

## 2014-08-19 DIAGNOSIS — E119 Type 2 diabetes mellitus without complications: Secondary | ICD-10-CM | POA: Diagnosis not present

## 2014-08-19 DIAGNOSIS — I4891 Unspecified atrial fibrillation: Secondary | ICD-10-CM | POA: Diagnosis not present

## 2014-08-19 LAB — BASIC METABOLIC PANEL
BUN: 21 mg/dL (ref 6–23)
CO2: 28 mEq/L (ref 19–32)
Calcium: 10.4 mg/dL (ref 8.4–10.5)
Chloride: 96 mEq/L (ref 96–112)
Creatinine, Ser: 0.71 mg/dL (ref 0.40–1.20)
GFR: 86.83 mL/min (ref >60.00)
Glucose, Bld: 150 mg/dL — ABNORMAL HIGH (ref 70–99)
POTASSIUM: 4 meq/L (ref 3.5–5.1)
SODIUM: 132 meq/L — AB (ref 135–145)

## 2014-08-19 LAB — HEMOGLOBIN A1C: Hgb A1c MFr Bld: 7.4 % — ABNORMAL HIGH (ref 4.6–6.5)

## 2014-08-19 MED ORDER — LISINOPRIL-HYDROCHLOROTHIAZIDE 20-25 MG PO TABS: ORAL_TABLET | ORAL | Status: DC

## 2014-08-19 MED ORDER — RIVAROXABAN 20 MG PO TABS: 20.0000 mg | ORAL_TABLET | Freq: Every day | ORAL | Status: DC

## 2014-08-19 MED ORDER — AMLODIPINE BESYLATE 10 MG PO TABS: 10.0000 mg | ORAL_TABLET | Freq: Every day | ORAL | Status: DC

## 2014-08-19 NOTE — Progress Notes (Signed)
   Subjective:    Patient ID: Carly Buckley, female    DOB: 03-04-1946, 69 y.o.   MRN: [id number]  HPI DM- chronic problem, on Metformin and amaryl.  On ACE for renal protection.  UTD on eye exam and foot exam.  Pt is walking daily- up to 2 miles.  Not checking sugars at home.  Denies symptomatic lows.  No numbness/tingling of hands/feet.  No CP, SOB, HAs, visual changes, edema, abd pain, N/V.   Review of Systems For ROS see HPI     Objective:   Physical Exam  Constitutional: She is oriented to person, place, and time. She appears well-developed and well-nourished. No distress.  HENT:  Head: Normocephalic and atraumatic.  Eyes: Conjunctivae and EOM are normal. Pupils are equal, round, and reactive to light.  Neck: Normal range of motion. Neck supple. No thyromegaly present.  Cardiovascular: Normal rate, regular rhythm, normal heart sounds and intact distal pulses.   No murmur heard. Pulmonary/Chest: Effort normal and breath sounds normal. No respiratory distress.  Abdominal: Soft. She exhibits no distension. There is no tenderness.  Musculoskeletal: She exhibits no edema.  Lymphadenopathy:    She has no cervical adenopathy.  Neurological: She is alert and oriented to person, place, and time.  Skin: Skin is warm and dry.  Psychiatric: She has a normal mood and affect. Her behavior is normal.  Vitals reviewed.         Assessment & Plan:

## 2014-08-19 NOTE — Patient Instructions (Signed)
Follow up in 3-4 months to recheck diabetes We'll notify you of your lab results and make any changes if needed Keep up the good work!  You look great! Call with any questions or concerns Happy Mother's Day!

## 2014-08-19 NOTE — Assessment & Plan Note (Signed)
Chronic problem.  Currently asymptomatic and w/o complication.  UTD on eye exam.  Foot exam done today.  On ACE for renal protection.  Exercising regularly- applauded her efforts.  Check labs.  Adjust meds prn

## 2014-08-19 NOTE — Progress Notes (Signed)
Pre visit review using our clinic review tool, if applicable. No additional management support is needed unless otherwise documented below in the visit note. 

## 2014-08-19 NOTE — Assessment & Plan Note (Signed)
Refill on Xarelto provided.

## 2014-08-20 ENCOUNTER — Encounter: Payer: Self-pay | Admitting: General Practice

## 2014-08-25 ENCOUNTER — Ambulatory Visit (HOSPITAL_BASED_OUTPATIENT_CLINIC_OR_DEPARTMENT_OTHER): Payer: Medicare Other | Admitting: Oncology

## 2014-08-25 ENCOUNTER — Other Ambulatory Visit (HOSPITAL_BASED_OUTPATIENT_CLINIC_OR_DEPARTMENT_OTHER): Payer: Medicare Other

## 2014-08-25 VITALS — BP 148/62 | HR 81 | Temp 97.6°F | Resp 98 | Ht 65.5 in | Wt 196.9 lb

## 2014-08-25 DIAGNOSIS — C189 Malignant neoplasm of colon, unspecified: Secondary | ICD-10-CM | POA: Diagnosis not present

## 2014-08-25 DIAGNOSIS — G629 Polyneuropathy, unspecified: Secondary | ICD-10-CM

## 2014-08-25 DIAGNOSIS — Z85038 Personal history of other malignant neoplasm of large intestine: Secondary | ICD-10-CM

## 2014-08-25 LAB — COMPREHENSIVE METABOLIC PANEL (CC13)
ALT: 17 U/L (ref 0–55)
ANION GAP: 12 meq/L — AB (ref 3–11)
AST: 15 U/L (ref 5–34)
Albumin: 4 g/dL (ref 3.5–5.0)
Alkaline Phosphatase: 79 U/L (ref 40–150)
BILIRUBIN TOTAL: 0.28 mg/dL (ref 0.20–1.20)
BUN: 24.1 mg/dL (ref 7.0–26.0)
CO2: 22 mEq/L (ref 22–29)
Calcium: 9.9 mg/dL (ref 8.4–10.4)
Chloride: 106 mEq/L (ref 98–109)
Creatinine: 0.8 mg/dL (ref 0.6–1.1)
EGFR: 75 mL/min/{1.73_m2} — AB (ref >90)
Glucose: 169 mg/dl — ABNORMAL HIGH (ref 70–140)
Potassium: 4 mEq/L (ref 3.5–5.1)
Sodium: 140 mEq/L (ref 136–145)
Total Protein: 7.3 g/dL (ref 6.4–8.3)

## 2014-08-25 LAB — CBC WITH DIFFERENTIAL/PLATELET
BASO%: 0.7 % (ref 0.0–2.0)
Basophils Absolute: 0 10*3/uL (ref 0.0–0.1)
EOS%: 3.9 % (ref 0.0–7.0)
Eosinophils Absolute: 0.2 10*3/uL (ref 0.0–0.5)
HEMATOCRIT: 36.2 % (ref 34.8–46.6)
HGB: 11.8 g/dL (ref 11.6–15.9)
LYMPH#: 1.6 10*3/uL (ref 0.9–3.3)
LYMPH%: 26.1 % (ref 14.0–49.7)
MCH: 30 pg (ref 25.1–34.0)
MCHC: 32.7 g/dL (ref 31.5–36.0)
MCV: 91.6 fL (ref 79.5–101.0)
MONO#: 0.3 10*3/uL (ref 0.1–0.9)
MONO%: 5.2 % (ref 0.0–14.0)
NEUT#: 4 10*3/uL (ref 1.5–6.5)
NEUT%: 64.1 % (ref 38.4–76.8)
Platelets: 177 10*3/uL (ref 145–400)
RBC: 3.95 10*6/uL (ref 3.70–5.45)
RDW: 15 % — ABNORMAL HIGH (ref 11.2–14.5)
WBC: 6.2 10*3/uL (ref 3.9–10.3)

## 2014-08-25 NOTE — Progress Notes (Signed)
Hematology and Oncology Follow Up Visit  DESHAY KIRSTEIN [id number] 12/04/1945 69 y.o. 08/25/2014 1:11 PM  CC: Rachell Cipro, M.D.  Haywood Lasso, M.D.  Jeryl Columbia, M.D.    Principle Diagnosis: This is a 69 year old woman with colon cancer diagnosed in June 2007, T3 N1 right-sided colon cancer.   Prior Therapy:  1. She had laparoscopically-assisted hemicolectomy done on September 19, 2005.  Had 2 cm tumor with 1/24 lymph nodes involved. 2. The patient received adjuvant FOLFOX chemotherapy utilizing 5-FU, leucovorin, and oxaliplatin therapy.  Therapy concluded in December 2007.  Therapy complicated by mild neutropenia and peripheral neuropathy.  Current therapy: Observation and surveillance.  Interim History:  Mrs. Labrum presents today for a followup visit with her husband.  Since her last visit, she reports doing very well without any new complaints.  She is up to speed on her colonoscopies the most recent of which was done in 2015.  She is not reporting any abdominal pain or distention.  Does not report any hematochezia.  Does not report any melena. She still has some residual grade one peripheral neuropathy that is unchanged. She does not report any headaches or blurry vision or double vision. Does not report any chest pain shortness of breath or difficulty breathing. She has not reported any frequency urgency or hesitancy. Does not report any musculoskeletal complaints. She has not reported any rashes or lesions or petechiae. The rest of review of system is unremarkable.  Medications: I have reviewed the patient's current medications.   Current Outpatient Prescriptions  Medication Sig Dispense Refill  . albuterol (PROAIR HFA) 108 (90 BASE) MCG/ACT inhaler Inhale 2 puffs into the lungs every 4 (four) hours as needed for wheezing or shortness of breath. 1 Inhaler 6  . amLODipine (NORVASC) 10 MG tablet Take 1 tablet (10 mg total) by mouth daily. 30 tablet 6  . conjugated estrogens  (PREMARIN) vaginal cream Place vaginally daily. 0.5-1 gram vaginally 42.5 g 11  . glimepiride (AMARYL) 4 MG tablet Take 2 tablets (8 mg total) by mouth daily. 60 tablet 6  . lisinopril-hydrochlorothiazide (PRINZIDE,ZESTORETIC) 20-25 MG per tablet TAKE ONE TABLET BY MOUTH ONCE DAILY BEFORE BREAKFAST 30 tablet 6  . metFORMIN (GLUCOPHAGE) 1000 MG tablet Take 1,000 mg by mouth 2 (two) times daily with a meal.     . metoprolol succinate (TOPROL-XL) 25 MG 24 hr tablet Take 1 tablet (25 mg total) by mouth daily. 30 tablet 6  . nystatin-triamcinolone ointment (MYCOLOG) APPLY 1 APPLICATION  TOPICALLY TO STOMACH TWICE DAILY AS NEEDED FOR  RASH 30 g 3  . ONE TOUCH ULTRA TEST test strip USE AS DIRECTED TO TEST BLOOD SUGAR 100 each 2  . Propylene Glycol (SYSTANE BALANCE) 0.6 % SOLN Place 1 drop into both eyes daily as needed (for dry eyes).    . rivaroxaban (XARELTO) 20 MG TABS tablet Take 1 tablet (20 mg total) by mouth daily with supper. 30 tablet 3   No current facility-administered medications for this visit.     Allergies:  Allergies  Allergen Reactions  . Morphine And Related     Nauseated   . Oxycodone Nausea Only     Physical Exam: Blood pressure 148/62, pulse 81, temperature 97.6 F (36.4 C), temperature source Oral, resp. rate 98, height 5' 5.5" (1.664 m), weight 196 lb 14.4 oz (89.313 kg), SpO2 99 %. ECOG: 1 General appearance: alert awake not in any distress Head: Normocephalic, without obvious abnormality.  Neck: no adenopathy, no masses.  Lymph  nodes: Cervical, supraclavicular, and axillary nodes normal. Heart:regular rate and rhythm, S1, S2.  Lung:chest clear, no wheezing.  Abdomin: soft, non-tender, without masses or organomegaly.  EXT:no erythema, induration, or nodules. Neurological examination: No deficits noted.   Lab Results: Lab Results  Component Value Date   WBC 6.2 08/25/2014   HGB 11.8 08/25/2014   HCT 36.2 08/25/2014   MCV 91.6 08/25/2014   PLT 177  08/25/2014     Chemistry      Component Value Date/Time   NA 132* 08/19/2014 1023   NA 143 08/25/2013 1307   NA 145 07/23/2011 1101   K 4.0 08/19/2014 1023   K 4.0 08/25/2013 1307   K 3.9 07/23/2011 1101   CL 96 08/19/2014 1023   CL 102 07/24/2012 1450   CL 100 07/23/2011 1101   CO2 28 08/19/2014 1023   CO2 23 08/25/2013 1307   CO2 27 07/23/2011 1101   BUN 21 08/19/2014 1023   BUN 20.1 08/25/2013 1307   BUN 22 07/23/2011 1101   CREATININE 0.71 08/19/2014 1023   CREATININE 0.8 08/25/2013 1307   CREATININE 1.0 07/23/2011 1101      Component Value Date/Time   CALCIUM 10.4 08/19/2014 1023   CALCIUM 10.2 08/25/2013 1307   CALCIUM 9.5 07/23/2011 1101   ALKPHOS 69 04/21/2014 1010   ALKPHOS 75 08/25/2013 1307   ALKPHOS 71 07/23/2011 1101   AST 17 04/21/2014 1010   AST 10 08/25/2013 1307   AST 18 07/23/2011 1101   ALT 18 04/21/2014 1010   ALT 16 08/25/2013 1307   ALT 20 07/23/2011 1101   BILITOT 0.5 04/21/2014 1010   BILITOT 0.31 08/25/2013 1307   BILITOT 0.50 07/23/2011 1101     Impression and Plan:  This is a pleasant 69 year old female with the following issues. 1. Stage III colon cancer, T3 N1 disease. No evidence to suggest recurrent disease at this time approaching 9 years from her diagnosis.  Her last CT scan in 2013 showed no relapse. There'll be no further need for oncology follow-up at this time and I'll be happy to see her in the future as needed. I offered her routine follow-up every 12 months or come back as needed and she elected to let me know if there is a problem and a follow-up at that time. 2. Colonoscopic screening.  Last was done in 2015 a likely will be repeated in 5 years. 3. Peripheral neuropathy.  Seems to be stable at this time. 4. Age-appropriate cancer screening: She is up-to-date and she is scheduled to have mammography in the near future.  Zola Button, MD 5/11/20161:11 PM

## 2014-08-26 ENCOUNTER — Ambulatory Visit: Payer: Medicare Other | Admitting: Oncology

## 2014-08-26 ENCOUNTER — Other Ambulatory Visit: Payer: Medicare Other

## 2014-08-26 LAB — CEA: CEA: 2.4 ng/mL (ref 0.0–5.0)

## 2014-08-27 ENCOUNTER — Other Ambulatory Visit: Payer: Self-pay | Admitting: General Practice

## 2014-08-27 MED ORDER — METFORMIN HCL 1000 MG PO TABS: 1000.0000 mg | ORAL_TABLET | Freq: Two times a day (BID) | ORAL | Status: DC

## 2014-10-15 ENCOUNTER — Other Ambulatory Visit: Payer: Self-pay | Admitting: General Practice

## 2014-10-15 MED ORDER — METFORMIN HCL 1000 MG PO TABS: 1000.0000 mg | ORAL_TABLET | Freq: Two times a day (BID) | ORAL | Status: DC

## 2014-10-28 ENCOUNTER — Telehealth: Payer: Self-pay | Admitting: Family Medicine

## 2014-10-28 NOTE — Telephone Encounter (Signed)
Pt called in regarding Xarelto. She said she was advised to call the office if she noticed blood in her stool. Pt states she noticed some yesterday and wanted to speak to a nurse. Transferred to A. Junious Silk.

## 2014-10-28 NOTE — Telephone Encounter (Signed)
Patient stated that she saw some blood on her tissue after a BM.  She did not notice any blood in the toilet or on stool.  Stool was a normal color and consistency.  She stated that she had abdominal pain yesterday before her stool and has not had any pain since.  She wanted to stop taking her Xarelto due to lawsuits she had seen and requested to be put on blood thinner that she had taken during knee replacement.  Per patient's chart, she was on Xarelto after knee replacement- patient notified.  She denies lightheadedness, shortness of breath, or any other signs of bleeding.  Notified patient that if she sees dark stool, feels dizzy to go to ED.  If she has another episode of blood on tissue, call and schedule office appointment.  Patient stated understanding and agreed.  Notified patient to keep taking Xarelto.  Dr. Birdie Riddle notified.

## 2014-11-01 ENCOUNTER — Encounter: Payer: Self-pay | Admitting: Internal Medicine

## 2014-11-01 ENCOUNTER — Telehealth: Payer: Self-pay | Admitting: Internal Medicine

## 2014-11-02 NOTE — Telephone Encounter (Signed)
Close encounter 

## 2014-11-30 ENCOUNTER — Encounter: Payer: Self-pay | Admitting: General Practice

## 2014-11-30 ENCOUNTER — Encounter: Payer: Self-pay | Admitting: Family Medicine

## 2014-11-30 ENCOUNTER — Ambulatory Visit (INDEPENDENT_AMBULATORY_CARE_PROVIDER_SITE_OTHER): Payer: Medicare Other | Admitting: Family Medicine

## 2014-11-30 VITALS — BP 132/76 | HR 78 | Temp 97.9°F | Resp 16 | Ht 66.0 in | Wt 199.0 lb

## 2014-11-30 DIAGNOSIS — E1140 Type 2 diabetes mellitus with diabetic neuropathy, unspecified: Secondary | ICD-10-CM | POA: Diagnosis not present

## 2014-11-30 DIAGNOSIS — I10 Essential (primary) hypertension: Secondary | ICD-10-CM | POA: Diagnosis not present

## 2014-11-30 DIAGNOSIS — Z23 Encounter for immunization: Secondary | ICD-10-CM | POA: Diagnosis not present

## 2014-11-30 DIAGNOSIS — E669 Obesity, unspecified: Secondary | ICD-10-CM | POA: Diagnosis not present

## 2014-11-30 LAB — CBC WITH DIFFERENTIAL/PLATELET
Basophils Absolute: 0 10*3/uL (ref 0.0–0.1)
Basophils Relative: 0.4 % (ref 0.0–3.0)
EOS PCT: 3.5 % (ref 0.0–5.0)
Eosinophils Absolute: 0.2 10*3/uL (ref 0.0–0.7)
HCT: 37.5 % (ref 36.0–46.0)
Hemoglobin: 12.4 g/dL (ref 12.0–15.0)
LYMPHS ABS: 1.3 10*3/uL (ref 0.7–4.0)
Lymphocytes Relative: 25.6 % (ref 12.0–46.0)
MCHC: 33 g/dL (ref 30.0–36.0)
MCV: 91.9 fl (ref 78.0–100.0)
MONO ABS: 0.3 10*3/uL (ref 0.1–1.0)
MONOS PCT: 5 % (ref 3.0–12.0)
Neutro Abs: 3.4 10*3/uL (ref 1.4–7.7)
Neutrophils Relative %: 65.5 % (ref 43.0–77.0)
Platelets: 183 10*3/uL (ref 150.0–400.0)
RBC: 4.08 Mil/uL (ref 3.87–5.11)
RDW: 14.8 % (ref 11.5–15.5)
WBC: 5.2 10*3/uL (ref 4.0–10.5)

## 2014-11-30 LAB — BASIC METABOLIC PANEL
BUN: 18 mg/dL (ref 6–23)
CO2: 26 mEq/L (ref 19–32)
Calcium: 9.8 mg/dL (ref 8.4–10.5)
Chloride: 100 mEq/L (ref 96–112)
Creatinine, Ser: 0.64 mg/dL (ref 0.40–1.20)
GFR: 97.8 mL/min (ref >60.00)
GLUCOSE: 156 mg/dL — AB (ref 70–99)
POTASSIUM: 3.9 meq/L (ref 3.5–5.1)
Sodium: 135 mEq/L (ref 135–145)

## 2014-11-30 LAB — HEMOGLOBIN A1C: Hgb A1c MFr Bld: 7.3 % — ABNORMAL HIGH (ref 4.6–6.5)

## 2014-11-30 MED ORDER — METFORMIN HCL 1000 MG PO TABS: 1000.0000 mg | ORAL_TABLET | Freq: Two times a day (BID) | ORAL | Status: DC

## 2014-11-30 MED ORDER — LISINOPRIL-HYDROCHLOROTHIAZIDE 20-25 MG PO TABS: ORAL_TABLET | ORAL | Status: DC

## 2014-11-30 MED ORDER — METOPROLOL SUCCINATE ER 25 MG PO TB24: 25.0000 mg | ORAL_TABLET | Freq: Every day | ORAL | Status: DC

## 2014-11-30 MED ORDER — GLIMEPIRIDE 4 MG PO TABS: 8.0000 mg | ORAL_TABLET | Freq: Every day | ORAL | Status: DC

## 2014-11-30 MED ORDER — AMLODIPINE BESYLATE 10 MG PO TABS: 10.0000 mg | ORAL_TABLET | Freq: Every day | ORAL | Status: DC

## 2014-11-30 NOTE — Assessment & Plan Note (Signed)
Chronic problem.  Adequate control.  Asymptomatic.  Check labs.  No anticipated med changes.  Refills provided.

## 2014-11-30 NOTE — Assessment & Plan Note (Signed)
Chronic problem.  Good med compliance.  UTD on eye exam.  Foot exam done today.  On ACE for renal protection.  Check labs.  Adjust meds prn

## 2014-11-30 NOTE — Progress Notes (Signed)
   Subjective:    Patient ID: Carly Buckley, female    DOB: 1945-06-29, 69 y.o.   MRN: [id number]  HPI DM- chronic problem.  On Metformin, Glimepiride.  UTD on foot exam, eye exam.  On ACE for renal protection.  Denies symptomatic lows.  No abd pain, N/V, no numbness/tingling above baseline.  HTN- chronic problem, on Amlodipine, Lisinopril-HCTZ, Metoprolol.  Denies CP, SOB, HAs, visual changes, edema.   Review of Systems For ROS see HPI     Objective:   Physical Exam  Constitutional: She is oriented to person, place, and time. She appears well-developed and well-nourished. No distress.  HENT:  Head: Normocephalic and atraumatic.  Eyes: Conjunctivae and EOM are normal. Pupils are equal, round, and reactive to light.  Neck: Normal range of motion. Neck supple. No thyromegaly present.  Cardiovascular: Normal rate, regular rhythm, normal heart sounds and intact distal pulses.   No murmur heard. Pulmonary/Chest: Effort normal and breath sounds normal. No respiratory distress.  Abdominal: Soft. She exhibits no distension. There is no tenderness.  Musculoskeletal: She exhibits no edema.  Lymphadenopathy:    She has no cervical adenopathy.  Neurological: She is alert and oriented to person, place, and time.  Skin: Skin is warm and dry.  Psychiatric: She has a normal mood and affect. Her behavior is normal.  Vitals reviewed.         Assessment & Plan:

## 2014-11-30 NOTE — Patient Instructions (Signed)
Schedule your complete physical after 04/22/15 We'll notify you of your lab results and make any changes if needed Keep up the good work on healthy diet and continue to get regular activity Call with any questions or concerns [place]!!!

## 2014-11-30 NOTE — Assessment & Plan Note (Signed)
Ongoing issue for pt.  Again encouraged low carb diet and regular exercise.  Will continue to follow.

## 2014-11-30 NOTE — Progress Notes (Signed)
Pre visit review using our clinic review tool, if applicable. No additional management support is needed unless otherwise documented below in the visit note. 

## 2014-12-10 ENCOUNTER — Other Ambulatory Visit: Payer: Self-pay | Admitting: Internal Medicine

## 2014-12-10 NOTE — Telephone Encounter (Signed)
Rx request sent to pharmacy.  

## 2014-12-15 ENCOUNTER — Ambulatory Visit: Payer: Medicare Other | Admitting: Internal Medicine

## 2015-01-06 ENCOUNTER — Encounter: Payer: Self-pay | Admitting: Internal Medicine

## 2015-01-06 ENCOUNTER — Ambulatory Visit (INDEPENDENT_AMBULATORY_CARE_PROVIDER_SITE_OTHER): Payer: Medicare Other | Admitting: Internal Medicine

## 2015-01-06 VITALS — BP 140/72 | HR 90 | Ht 65.0 in | Wt 197.0 lb

## 2015-01-06 DIAGNOSIS — R000 Tachycardia, unspecified: Secondary | ICD-10-CM

## 2015-01-06 DIAGNOSIS — I10 Essential (primary) hypertension: Secondary | ICD-10-CM

## 2015-01-06 DIAGNOSIS — R9431 Abnormal electrocardiogram [ECG] [EKG]: Secondary | ICD-10-CM | POA: Diagnosis not present

## 2015-01-06 DIAGNOSIS — I480 Paroxysmal atrial fibrillation: Secondary | ICD-10-CM | POA: Diagnosis not present

## 2015-01-06 MED ORDER — METOPROLOL SUCCINATE ER 25 MG PO TB24: 37.5000 mg | ORAL_TABLET | Freq: Every day | ORAL | Status: DC

## 2015-01-06 NOTE — Progress Notes (Signed)
OFFICE NOTE  Chief Complaint:   Occasional edema  Primary Care Physician: Annye Asa, MD  HPI:  Carly Buckley  Is a pleasant 69 year old female kindly referred to me by Dr. Alvester Morin for palpitations. She recently saw her in the office for a routine physical and was found to have atrial fibrillation. She does have a history of palpitations has gone on for number of years.  Over the phone I recommended she start on Xarelto  And she's been taking that medication since we spoke about a month ago. She  Presents today for formal cardiac evaluation. Her EKG demonstrates a sinus tachycardia with Q waves in 3 and aVF as well as poor anterior R-wave progression out until lead V6 concerning for possible anterolateral infarct and inferior infarct.  She denies any history of known coronary disease. Apparently in the past she been evaluated by Dr. Cathie Olden  And has stress testing in number of years ago which was negative. She denies any chest pain but does report shortness of breath with exertion and intermittent palpitations. She also has rest tachycardia.  I had the pleasure see [name] back in the office today. She reports some improvement in her shortness of breath. Her stress test was negative and EF was preserved at 68%. She reports no further significant palpitations. She's having no bleeding problems on Xarelto. She started walking and feels that she's had improved energy and shortness of breath. I have encouraged her to continue to walk.   I saw [name] back in the office today. Overall she's doing fairly well. She says she's had a few episodes of breakthrough atrial fibrillation but feels like it's not that frequently. She is taking Xarelto without any bleeding problems. Blood pressure is top normal today heart rate is in the 90s. She also has some occasional lower extremity swelling, suspect from Norvasc. She would benefit from elevation of the legs and compression stockings.  PMHx:    Past Medical History  Diagnosis Date  . Colon cancer     colon ca dx 07  . Diabetes mellitus   . Hypertension   . Malignant neoplasm of colon   . Neuropathy, idiopathic   . Type II diabetes mellitus without mention of complication   . Helicobacter pylori (H. pylori)   . Diverticulitis   . Colon cancer 2008  . Arthritis   . History of colon cancer 2007    colon resection  . Cough 07/24/12    productive, ? color, no fever, congested   . Dysrhythmia     palpitations  . Complication of anesthesia   . PONV (postoperative nausea and vomiting)     Past Surgical History  Procedure Laterality Date  . Colon resection  2007    colon cancer  . Tubal ligation    . Tonsillectomy    . Total knee arthroplasty Right 08/04/2012    Procedure: TOTAL KNEE ARTHROPLASTY;  Surgeon: Gearlean Alf, MD;  Location: [facility];  Service: Orthopedics;  Laterality: Right;  . Steriod injection Left 08/04/2012    Procedure: STEROID INJECTION;  Surgeon: Gearlean Alf, MD;  Location: [facility];  Service: Orthopedics;  Laterality: Left;    FAMHx:  Family History  Problem Relation Age of Onset  . Diabetes Sister   . Diabetes Brother   . Brain cancer Daughter   . Lung cancer Mother 57  . Kidney failure Father 12    SOCHx:   reports that she quit smoking about 2  years ago. She has never used smokeless tobacco. She reports that she does not drink alcohol or use illicit drugs.  ALLERGIES:  Allergies  Allergen Reactions  . Morphine And Related     Nauseated   . Oxycodone Nausea Only    ROS: A comprehensive review of systems was negative except for: Cardiovascular: positive for lower extremity edema  HOME MEDS: Current Outpatient Prescriptions  Medication Sig Dispense Refill  . albuterol (PROAIR HFA) 108 (90 BASE) MCG/ACT inhaler Inhale 2 puffs into the lungs every 4 (four) hours as needed for wheezing or shortness of breath. 1 Inhaler 6  . amLODipine (NORVASC) 10 MG tablet Take 1 tablet (10 mg  total) by mouth daily. 90 tablet 1  . conjugated estrogens (PREMARIN) vaginal cream Place vaginally daily. 0.5-1 gram vaginally 42.5 g 11  . glimepiride (AMARYL) 4 MG tablet Take 2 tablets (8 mg total) by mouth daily. 90 tablet 1  . lisinopril-hydrochlorothiazide (PRINZIDE,ZESTORETIC) 20-25 MG per tablet TAKE ONE TABLET BY MOUTH ONCE DAILY BEFORE BREAKFAST 90 tablet 1  . metFORMIN (GLUCOPHAGE) 1000 MG tablet Take 1 tablet (1,000 mg total) by mouth 2 (two) times daily with a meal. 180 tablet 1  . metoprolol succinate (TOPROL-XL) 25 MG 24 hr tablet Take 1.5 tablets (37.5 mg total) by mouth daily. 45 tablet 6  . nystatin-triamcinolone ointment (MYCOLOG) APPLY 1 APPLICATION  TOPICALLY TO STOMACH TWICE DAILY AS NEEDED FOR  RASH 30 g 3  . ONE TOUCH ULTRA TEST test strip USE AS DIRECTED TO TEST BLOOD SUGAR 100 each 2  . Propylene Glycol (SYSTANE BALANCE) 0.6 % SOLN Place 1 drop into both eyes daily as needed (for dry eyes).    . rivaroxaban (XARELTO) 20 MG TABS tablet Take 1 tablet (20 mg total) by mouth daily with supper. 30 tablet 3   No current facility-administered medications for this visit.    LABS/IMAGING: No results found for this or any previous visit (from the past 48 hour(s)). No results found.  VITALS: BP 140/72 mmHg  Pulse 90  Ht 5\' 5"  (1.651 m)  Wt 197 lb (89.359 kg)  BMI 32.78 kg/m2  EXAM: General appearance: alert and no distress Neck: no carotid bruit and no JVD Lungs: clear to auscultation bilaterally Heart: regular rate and rhythm, S1, S2 normal, no murmur, click, rub or gallop Abdomen: soft, non-tender; bowel sounds normal; no masses,  no organomegaly Extremities: extremities normal, atraumatic, no cyanosis or edema Pulses: 2+ and symmetric Skin: Skin color, texture, turgor normal. No rashes or lesions Neurologic: Grossly normal  psych: Pleasant  EKG:  Normal sinus rhythm at 90  ASSESSMENT: 1. Paroxysmal atrial fibrillation 2. Anticoagulation on Xarelto for  CHADSVASC score of 4 3. HTN 4. DM2 5. Abnormal EKG concerning for prior infarct - low risk nuclear stress test with EF 68%  PLAN: 1.   Mrs. Venezia has done well on Xarelto. I increased her Toprol last time for tachycardia and heart rate  Is still elevated. There is still room in her blood pressure to tolerate more medication. I recommend increasing her Toprol-XL now to 37.5 mg daily. Again I've encouraged her to work on elevating her feet and use compression stockings as necessary for swelling.   Plan to see her back in 6 months or sooner as necessary.  Pixie Casino, MD, [facility] Attending Cardiologist [place] [facility] [place] 01/06/2015, 5:22 PM

## 2015-01-06 NOTE — Patient Instructions (Signed)
Your physician has recommended you make the following change in your medication: INCREASE METOPROLOL TO 37.5MG  (1 1/2 TABLETS) DAILY.  A NEW RX HAS BEEN SENT TO  YOUR PHARMACY.  Your physician recommends that you schedule a follow-up appointment in: [place] DR. HILTY.    PRADAXA AND ELIQUIS ARE MEDS THAT CAN BE USED IN PLACE OF XARELTO.  YOU CAN CHECK WITH YOUR INSURANCE COMPANY AND SEE IF ONE IF MORE COST EFFECTIVE FOR YOU.

## 2015-01-07 ENCOUNTER — Other Ambulatory Visit: Payer: Self-pay | Admitting: Family Medicine

## 2015-01-07 NOTE — Telephone Encounter (Signed)
Please advise is this a long term medication for pt? Has been filled since 2014.

## 2015-01-07 NOTE — Telephone Encounter (Signed)
Medication filled to pharmacy as requested.   

## 2015-02-21 ENCOUNTER — Other Ambulatory Visit: Payer: Self-pay

## 2015-02-21 ENCOUNTER — Telehealth: Payer: Self-pay | Admitting: Internal Medicine

## 2015-02-21 MED ORDER — APIXABAN 5 MG PO TABS: 5.0000 mg | ORAL_TABLET | Freq: Two times a day (BID) | ORAL | Status: DC

## 2015-02-21 NOTE — Telephone Encounter (Signed)
Routed to [place] to assist in changing patient from [place] to eliquis - MD had notified her of other NOAC options at her last visit

## 2015-02-21 NOTE — Telephone Encounter (Signed)
Pt called in stating that she would like to change her blood thinner to Eliquis. She says that she has already discussed this with Dr. Debara Pickett and would like it called in the [facility] on [address]. She says she is running low on pills and would need this called in as soon as possible. Please f/u with her  Thanks

## 2015-02-21 NOTE — Telephone Encounter (Signed)
Spoke with patient, wishes to switch from Xarelto to Eliquis.  Based on SCr 0.64, age 69 and weight of 197 lb, she will be on 5 mg bid.  Explained to start Eliquis 24 hrs after last dose of Xarelto, then take q12h.  Patient voiced understanding, rx sent to [facility] on [address]

## 2015-02-22 ENCOUNTER — Telehealth: Payer: Self-pay

## 2015-02-22 ENCOUNTER — Telehealth: Payer: Self-pay | Admitting: Internal Medicine

## 2015-02-22 NOTE — Telephone Encounter (Signed)
Prior auth for Eliquis 5 mg sent to [facility]. 

## 2015-02-22 NOTE — Telephone Encounter (Signed)
Calling because the member is needing a Prior auth for Eliquis also wants to Tier exception as well , please call if you have any questions .Marland Kitchen  Thanks

## 2015-02-25 ENCOUNTER — Telehealth: Payer: Self-pay | Admitting: *Deleted

## 2015-02-25 ENCOUNTER — Encounter: Payer: Self-pay | Admitting: *Deleted

## 2015-02-25 NOTE — Telephone Encounter (Signed)
Patient and husband walked into to office. They state they have called their insurance - [employer] - and were told that patient's eliquis 5mg  BID can be changed from tier 3 ($120/month) to tier 2 ($8/month) if the MD calls and tells them it needs to be done. Explained to patient and her husband that a PA for this medication has been completed and that the processing of this type of paperwork is done in a central location not in our office thus I was not aware of this situation. Explained that I could offer samples and free 30 day card for eliquis so that patient can get started on this. Husband provided insurance phone # and was very insistent that I call today about a tier exception. Offered samples and free 30 day card so that patient would have medication if this was something that could not be fully addressed today.   [place] - patient's eliquis has been approved at tier 3  Submitted tier exception request over the phone - pending pharmacist review Case ID [id number]  LM for patient that tier exception has been submitted  Insurance phone: [phone]  eliquis 2.5mg  samples with instructions to take TWO tablets q12h given to patient - 4 boxes = 2 weeks Provided free 30 day card

## 2015-02-28 NOTE — Telephone Encounter (Signed)
Received notification that tier exception for eliquis was denied. Called patient and communicated this information. Her husband informed me that he has a letter stating the medication will only cost them $8/month - informed husband I do not have that information. Explained that med is covered w/insurance but does not qualify for lower tier. They will let us know if they cannot afford.

## 2015-03-03 ENCOUNTER — Telehealth: Payer: Self-pay | Admitting: Internal Medicine

## 2015-03-03 NOTE — Telephone Encounter (Signed)
Spoke with pt husband about medication as it is to expensive, $120/ month until they hit the donut hole and the cost increases as they have to pay out of pocket. He stated he has tried to do the assistance program but they do not qualify.  He would like to know what else there is that he wife can take that is less expensive, and not warfarin.  Told him I would send request over to Dr. Debara Pickett and our office will call him with his suggestions.  He verbalized understanding, no questions at this time.

## 2015-03-03 NOTE — Telephone Encounter (Signed)
Spoke with husband, with samples and 30 day free card, believes he has enough to last until January.  Advised that donut hole will end, should go back to $45/month copay, but he should fill on Jan 1 or 2 to be sure. He will call in January if price is still cost prohibitive

## 2015-03-03 NOTE — Telephone Encounter (Signed)
Carly Buckley is calling to speak to a nurse about his wife Carly Buckley medication..  Thanks

## 2015-03-03 NOTE — Telephone Encounter (Signed)
Carly Buckley - can you assist?  This patient wanted to change from xarelto (due to cost I think) to eliquis. Eliquis is now too costly. Had previously explained to patient/husband that the only generic medication comparable was warfarin but this came with routine blood monitoring.

## 2015-03-14 ENCOUNTER — Ambulatory Visit: Payer: Medicare Other | Admitting: Primary Care

## 2015-03-17 ENCOUNTER — Ambulatory Visit (INDEPENDENT_AMBULATORY_CARE_PROVIDER_SITE_OTHER): Payer: Medicare Other | Admitting: Primary Care

## 2015-03-17 ENCOUNTER — Encounter: Payer: Self-pay | Admitting: Primary Care

## 2015-03-17 VITALS — BP 136/72 | HR 79 | Temp 97.3°F | Ht 65.0 in | Wt 200.4 lb

## 2015-03-17 DIAGNOSIS — I480 Paroxysmal atrial fibrillation: Secondary | ICD-10-CM | POA: Diagnosis not present

## 2015-03-17 DIAGNOSIS — I10 Essential (primary) hypertension: Secondary | ICD-10-CM

## 2015-03-17 DIAGNOSIS — E1140 Type 2 diabetes mellitus with diabetic neuropathy, unspecified: Secondary | ICD-10-CM | POA: Diagnosis not present

## 2015-03-17 NOTE — Assessment & Plan Note (Signed)
A1C of 7.3 in August 2016.  Currently managed on glimiperide and metformin. Endorses fair diet. Will recheck A1C at upcoming physical in January.

## 2015-03-17 NOTE — Progress Notes (Signed)
Pre visit review using our clinic review tool, if applicable. No additional management support is needed unless otherwise documented below in the visit note. 

## 2015-03-17 NOTE — Progress Notes (Signed)
Subjective:    Patient ID: Carly Buckley, female    DOB: [birth year], 69 y.o.   MRN: [id number]  HPI  Carly Buckley is a 69 year old female who is transferring from the [employer] office. She has not complaints today and is not due for refills at this time. Her last physical was in early January 2016. Records reviewed.  1) Essential Hypertension: Currently managed on amlodipine 10 mg, lisinopril/HCTZ  20/25 mg, Toprol XL 25 mg. BP stable in clinic today. Denies chest pain, headaches, shortness of breath, dizziness.  2) Type 2 Diabetes: Currently managed on Glimepiride 4 mg and Metformin 1000 mg BID. She does not check her sugars at home. She endorses a fair diet overall.  3) Atrial Fibrillation: Currently managed on eliquis 5 mg. A1C of 7.3 in August 2016. She follows with cardiology every 6 months. Denies chest pain, falls, bruising.  Review of Systems  Respiratory: Negative for shortness of breath.   Cardiovascular: Negative for chest pain.  Neurological: Negative for dizziness, numbness and headaches.       Past Medical History  Diagnosis Date  . Colon cancer ([place])     colon ca dx 07, colon resection  . Diabetes mellitus   . Hypertension   . Malignant neoplasm of colon   . Neuropathy, idiopathic   . Type II diabetes mellitus without mention of complication   . Helicobacter pylori (H. pylori)   . Diverticulitis   . Arthritis   . Cough 07/24/12    productive, ? color, no fever, congested   . Dysrhythmia     palpitations  . Complication of anesthesia   . PONV (postoperative nausea and vomiting)     Social History   Social History  . Marital Status: Married    Spouse Name: N/A  . Number of Children: N/A  . Years of Education: N/A   Occupational History  . Not on file.   Social History Main Topics  . Smoking status: Former Smoker    Quit date: 06/18/1972  . Smokeless tobacco: Never Used  . Alcohol Use: No  . Drug Use: No  . Sexual Activity: Not on file    Other Topics Concern  . Not on file   Social History Narrative   Married.   4 children, 7 grandchildren, 3 great grandchildren.   Retired. Once worked at [employer]	[place].   Enjoys walking, puzzle books, spending time with family.    Past Surgical History  Procedure Laterality Date  . Colon resection  2007    colon cancer  . Tubal ligation    . Tonsillectomy    . Total knee arthroplasty Right 08/04/2012    Procedure: TOTAL KNEE ARTHROPLASTY;  Surgeon: Gearlean Alf, MD;  Location: [facility];  Service: Orthopedics;  Laterality: Right;  . Steriod injection Left 08/04/2012    Procedure: STEROID INJECTION;  Surgeon: Gearlean Alf, MD;  Location: [facility];  Service: Orthopedics;  Laterality: Left;    Family History  Problem Relation Age of Onset  . Diabetes Sister   . Diabetes Brother   . Brain cancer Daughter   . Lung cancer Mother 61  . Kidney failure Father 61    Allergies  Allergen Reactions  . Morphine And Related     Nauseated   . Oxycodone Nausea Only    Current Outpatient Prescriptions on File Prior to Visit  Medication Sig Dispense Refill  . amLODipine (NORVASC) 10 MG tablet Take 1 tablet (10 mg  total) by mouth daily. 90 tablet 1  . apixaban (ELIQUIS) 5 MG TABS tablet Take 1 tablet (5 mg total) by mouth 2 (two) times daily. 60 tablet 5  . conjugated estrogens (PREMARIN) vaginal cream Place vaginally daily. 0.5-1 gram vaginally 42.5 g 11  . glimepiride (AMARYL) 4 MG tablet Take 2 tablets (8 mg total) by mouth daily. 90 tablet 1  . lisinopril-hydrochlorothiazide (PRINZIDE,ZESTORETIC) 20-25 MG per tablet TAKE ONE TABLET BY MOUTH ONCE DAILY BEFORE BREAKFAST 90 tablet 1  . metFORMIN (GLUCOPHAGE) 1000 MG tablet Take 1 tablet (1,000 mg total) by mouth 2 (two) times daily with a meal. 180 tablet 1  . metoprolol succinate (TOPROL-XL) 25 MG 24 hr tablet Take 1.5 tablets (37.5 mg total) by mouth daily. 45 tablet 6  . ONE TOUCH ULTRA TEST test strip USE AS DIRECTED TO TEST  BLOOD SUGAR 100 each 2  . Propylene Glycol (SYSTANE BALANCE) 0.6 % SOLN Place 1 drop into both eyes daily as needed (for dry eyes).    Marland Kitchen albuterol (PROAIR HFA) 108 (90 BASE) MCG/ACT inhaler Inhale 2 puffs into the lungs every 4 (four) hours as needed for wheezing or shortness of breath. (Patient not taking: Reported on 03/17/2015) 1 Inhaler 6  . nystatin-triamcinolone ointment (MYCOLOG) APPLY 1 APPLICATION  TOPICALLY TO STOMACH TWICE DAILY AS NEEDED FOR  RASH (Patient not taking: Reported on 03/17/2015) 30 g 3   No current facility-administered medications on file prior to visit.    BP 136/72 mmHg  Pulse 79  Temp(Src) 97.3 F (36.3 C) (Oral)  Ht 5\' 5"  (1.651 m)  Wt 200 lb 6.4 oz (90.901 kg)  BMI 33.35 kg/m2  SpO2 97%    Objective:   Physical Exam  Constitutional: She is oriented to person, place, and time. She appears well-nourished.  Cardiovascular: Normal rate and regular rhythm.   Pulmonary/Chest: Effort normal and breath sounds normal.  Neurological: She is alert and oriented to person, place, and time.  Skin: Skin is warm and dry.          Assessment & Plan:

## 2015-03-17 NOTE — Assessment & Plan Note (Signed)
Rate regular today. Currently managed on eliquis 5 mg and follows with cardiology.

## 2015-03-17 NOTE — Assessment & Plan Note (Signed)
Stable today in clinic. Continue current regimen.

## 2015-03-17 NOTE — Patient Instructions (Signed)
Please schedule a physical with me in early to mid January. You will also schedule a lab only appointment one week prior. We will discuss your lab results during your physical.  It was a pleasure to meet you today! Please don't hesitate to call me with any questions. Welcome to [facility]!

## 2015-04-12 ENCOUNTER — Other Ambulatory Visit: Payer: Self-pay

## 2015-04-12 MED ORDER — LISINOPRIL-HYDROCHLOROTHIAZIDE 20-25 MG PO TABS: ORAL_TABLET | ORAL | Status: DC

## 2015-04-12 NOTE — Telephone Encounter (Signed)
Pt request written 90 day rx for lisinopril HCTZ; pts previous rx was by Dr Birdie Riddle and pt wants written rx to make sure pharmacy does fill correctly. PT IS WAITING. Pt established care 1201/16 and has CPX scheduled 05/04/15.

## 2015-04-12 NOTE — Telephone Encounter (Signed)
Allie Bossier NP did hand written rx for lisinopril HCTZ 20-25 mg with instructions take one tab by mouth every morning. # 90 x 3. Pt given rx.

## 2015-04-19 ENCOUNTER — Other Ambulatory Visit: Payer: Self-pay | Admitting: Primary Care

## 2015-04-19 DIAGNOSIS — E119 Type 2 diabetes mellitus without complications: Secondary | ICD-10-CM

## 2015-04-19 DIAGNOSIS — I10 Essential (primary) hypertension: Secondary | ICD-10-CM

## 2015-04-28 ENCOUNTER — Other Ambulatory Visit (INDEPENDENT_AMBULATORY_CARE_PROVIDER_SITE_OTHER): Payer: Medicare Other

## 2015-04-28 DIAGNOSIS — E119 Type 2 diabetes mellitus without complications: Secondary | ICD-10-CM

## 2015-04-28 DIAGNOSIS — I10 Essential (primary) hypertension: Secondary | ICD-10-CM

## 2015-04-28 LAB — COMPREHENSIVE METABOLIC PANEL
ALBUMIN: 4.5 g/dL (ref 3.5–5.2)
ALT: 19 U/L (ref 0–35)
AST: 14 U/L (ref 0–37)
Alkaline Phosphatase: 62 U/L (ref 39–117)
BILIRUBIN TOTAL: 0.5 mg/dL (ref 0.2–1.2)
BUN: 29 mg/dL — ABNORMAL HIGH (ref 6–23)
CALCIUM: 10.5 mg/dL (ref 8.4–10.5)
CO2: 28 mEq/L (ref 19–32)
Chloride: 100 mEq/L (ref 96–112)
Creatinine, Ser: 0.8 mg/dL (ref 0.40–1.20)
GFR: 75.5 mL/min (ref >60.00)
GLUCOSE: 141 mg/dL — AB (ref 70–99)
POTASSIUM: 4.9 meq/L (ref 3.5–5.1)
SODIUM: 137 meq/L (ref 135–145)
TOTAL PROTEIN: 7.4 g/dL (ref 6.0–8.3)

## 2015-04-28 LAB — HEMOGLOBIN A1C: HEMOGLOBIN A1C: 7.8 % — AB (ref 4.6–6.5)

## 2015-04-28 LAB — LIPID PANEL
CHOL/HDL RATIO: 3
CHOLESTEROL: 159 mg/dL (ref 0–200)
HDL: 59.9 mg/dL (ref >39.00)
LDL Cholesterol: 89 mg/dL (ref 0–99)
NonHDL: 98.87
TRIGLYCERIDES: 48 mg/dL (ref 0.0–149.0)
VLDL: 9.6 mg/dL (ref 0.0–40.0)

## 2015-04-28 LAB — TSH: TSH: 3.25 u[IU]/mL (ref 0.35–4.50)

## 2015-05-04 ENCOUNTER — Encounter: Payer: Self-pay | Admitting: Primary Care

## 2015-05-04 ENCOUNTER — Ambulatory Visit (INDEPENDENT_AMBULATORY_CARE_PROVIDER_SITE_OTHER): Payer: Medicare Other | Admitting: Primary Care

## 2015-05-04 VITALS — BP 134/64 | HR 91 | Temp 97.6°F | Ht 65.0 in | Wt 198.8 lb

## 2015-05-04 DIAGNOSIS — E669 Obesity, unspecified: Secondary | ICD-10-CM

## 2015-05-04 DIAGNOSIS — I10 Essential (primary) hypertension: Secondary | ICD-10-CM

## 2015-05-04 DIAGNOSIS — Z0000 Encounter for general adult medical examination without abnormal findings: Secondary | ICD-10-CM

## 2015-05-04 DIAGNOSIS — Z13820 Encounter for screening for osteoporosis: Secondary | ICD-10-CM | POA: Diagnosis not present

## 2015-05-04 DIAGNOSIS — Z1239 Encounter for other screening for malignant neoplasm of breast: Secondary | ICD-10-CM | POA: Diagnosis not present

## 2015-05-04 DIAGNOSIS — E1140 Type 2 diabetes mellitus with diabetic neuropathy, unspecified: Secondary | ICD-10-CM

## 2015-05-04 NOTE — Assessment & Plan Note (Signed)
Poor diet. Discussed good food choices and to increase exercise duration.

## 2015-05-04 NOTE — Assessment & Plan Note (Signed)
A1C of 7.8 as of recent. Still at goal of <8, but will closely monitor. Discussed the importance of a healthy diet and regular exercise in order for weight loss and to reduce risk of other medical diseases. Will recheck levels in 3 months.

## 2015-05-04 NOTE — Progress Notes (Signed)
Pre visit review using our clinic review tool, if applicable. No additional management support is needed unless otherwise documented below in the visit note. 

## 2015-05-04 NOTE — Assessment & Plan Note (Signed)
Declines flu, zostavax, and tetanus even though recommended. Bone density scan and mammogram ordered. Will provide pneumovax at next visit. Discussed the importance of a healthy diet and regular exercise in order for weight loss and to reduce risk of other medical diseases.  I have personally reviewed and have noted: 1. The patient's medical and social history 2. Their use of alcohol, tobacco or illicit drugs 3. Their current medications and supplements 4. The patient's functional ability including ADL's, fall risks,  home safety risks and hearing or visual impairment. 5. Diet and physical activities 6. Evidence for depression or mood disorder  Follow up in 1 year for repeat MWV.

## 2015-05-04 NOTE — Progress Notes (Signed)
Patient ID: Carly Buckley, female   DOB: 1945/09/16, 70 y.o.   MRN: [id number]  HPI: Carly Buckley is a 70 year old female who presents today for Medicare Wellness Exam.  Past Medical History  Diagnosis Date  . Colon cancer ([place])     colon ca dx 07, colon resection  . Diabetes mellitus   . Hypertension   . Malignant neoplasm of colon   . Neuropathy, idiopathic   . Type II diabetes mellitus without mention of complication   . Helicobacter pylori (H. pylori)   . Diverticulitis   . Arthritis   . Cough 07/24/12    productive, ? color, no fever, congested   . Dysrhythmia     palpitations  . Complication of anesthesia   . PONV (postoperative nausea and vomiting)     Current Outpatient Prescriptions  Medication Sig Dispense Refill  . amLODipine (NORVASC) 10 MG tablet Take 1 tablet (10 mg total) by mouth daily. 90 tablet 1  . apixaban (ELIQUIS) 5 MG TABS tablet Take 1 tablet (5 mg total) by mouth 2 (two) times daily. 60 tablet 5  . conjugated estrogens (PREMARIN) vaginal cream Place vaginally daily. 0.5-1 gram vaginally 42.5 g 11  . glimepiride (AMARYL) 4 MG tablet Take 2 tablets (8 mg total) by mouth daily. 90 tablet 1  . lisinopril-hydrochlorothiazide (PRINZIDE,ZESTORETIC) 20-25 MG tablet TAKE ONE TABLET BY MOUTH ONCE DAILY BEFORE BREAKFAST 90 tablet 3  . metFORMIN (GLUCOPHAGE) 1000 MG tablet Take 1 tablet (1,000 mg total) by mouth 2 (two) times daily with a meal. 180 tablet 1  . metoprolol succinate (TOPROL-XL) 25 MG 24 hr tablet Take 1.5 tablets (37.5 mg total) by mouth daily. 45 tablet 6  . ONE TOUCH ULTRA TEST test strip USE AS DIRECTED TO TEST BLOOD SUGAR 100 each 2  . Propylene Glycol (SYSTANE BALANCE) 0.6 % SOLN Place 1 drop into both eyes daily as needed (for dry eyes).    Marland Kitchen albuterol (PROAIR HFA) 108 (90 BASE) MCG/ACT inhaler Inhale 2 puffs into the lungs every 4 (four) hours as needed for wheezing or shortness of breath. (Patient not taking: Reported on 03/17/2015) 1 Inhaler 6  .  nystatin-triamcinolone ointment (MYCOLOG) APPLY 1 APPLICATION  TOPICALLY TO STOMACH TWICE DAILY AS NEEDED FOR  RASH (Patient not taking: Reported on 03/17/2015) 30 g 3   No current facility-administered medications for this visit.    Allergies  Allergen Reactions  . Morphine And Related     Nauseated   . Oxycodone Nausea Only    Family History  Problem Relation Age of Onset  . Diabetes Sister   . Diabetes Brother   . Brain cancer Daughter   . Lung cancer Mother 66  . Kidney failure Father 37    Social History   Social History  . Marital Status: Married    Spouse Name: N/A  . Number of Children: N/A  . Years of Education: N/A   Occupational History  . Not on file.   Social History Main Topics  . Smoking status: Former Smoker    Quit date: 06/18/1972  . Smokeless tobacco: Never Used  . Alcohol Use: No  . Drug Use: No  . Sexual Activity: Not on file   Other Topics Concern  . Not on file   Social History Narrative   Married.   4 children, 7 grandchildren, 3 great grandchildren.   Retired. Once worked at [employer]	[place].   Enjoys walking, puzzle books, spending time with family.  Hospitiliaztions: None  Health Maintenance:    Flu: Declines  Tetanus: Unsure. Declines  Pneumovax: Has not completed  Prevnar: Completed in August 2016  Zostavax: Has not completed. Declines  Bone Density: Last completed in 2013  Colonoscopy: Completed in 2015 via [facility]  Eye Doctor: Scheduled for January 23rd  Dental Exam: Completed 4-5 years ago.  Mammogram: Last completed in 2013      Providers: Dr. Shirley Muscat, Optometrist, Dr. Katy Fitch, Glaucoma specialist, Alma Friendly, PCP   I have personally reviewed and have noted: 1. The patient's medical and social history 2. Their use of alcohol, tobacco or illicit drugs 3. Their current medications and supplements 4. The patient's functional ability including ADL's, fall risks, home safety risks  and  hearing or visual impairment. 5. Diet and physical activities 6. Evidence for depression or mood disorder  Subjective:   Review of Systems:   Constitutional: Denies fever, malaise, fatigue, headache or abrupt weight changes.  HEENT: Denies eye pain, eye redness, ear pain, ringing in the ears, wax buildup, runny nose, nasal congestion, bloody nose, or sore throat. Respiratory: Denies difficulty breathing, shortness of breath, cough or sputum production.   Cardiovascular: Denies chest pain, chest tightness, palpitations or swelling in the hands or feet.  Gastrointestinal: Denies abdominal pain, bloating, constipation, diarrhea or blood in the stool.  GU: Denies urgency, frequency, pain with urination, burning sensation, blood in urine, odor or discharge. Musculoskeletal: Denies decrease in range of motion, difficulty with gait, muscle pain or joint pain and swelling.  Skin: Denies redness, rashes, lesions or ulcercations.  Neurological: Denies dizziness, difficulty with memory, difficulty with speech or problems with balance and coordination.   No other specific complaints in a complete review of systems (except as listed in HPI above).  Objective:  PE:   BP 134/64 mmHg  Pulse 91  Temp(Src) 97.6 F (36.4 C) (Oral)  Ht 5\' 5"  (1.651 m)  Wt 198 lb 12.8 oz (90.175 kg)  BMI 33.08 kg/m2  SpO2 98% Wt Readings from Last 3 Encounters:  05/04/15 198 lb 12.8 oz (90.175 kg)  03/17/15 200 lb 6.4 oz (90.901 kg)  01/06/15 197 lb (89.359 kg)    General: Appears their stated age, well developed, well nourished in NAD. Skin: Warm, dry and intact. No rashes, lesions or ulcerations noted. HEENT: Head: normal shape and size; Eyes: sclera white, no icterus, conjunctiva pink, PERRLA and EOMs intact; Ears: Tm's gray and intact, normal light reflex; Nose: mucosa pink and moist, septum midline; Throat/Mouth: Teeth present, mucosa pink and moist, no exudate, lesions or ulcerations noted.  Neck: Normal  range of motion. Neck supple, trachea midline. No massses, lumps or thyromegaly present.  Cardiovascular: Normal rate and rhythm. S1,S2 noted.  No murmur, rubs or gallops noted. No JVD or BLE edema. No carotid bruits noted. Pulmonary/Chest: Normal effort and positive vesicular breath sounds. No respiratory distress. No wheezes, rales or ronchi noted.  Abdomen: Soft and nontender. Normal bowel sounds, no bruits noted. No distention or masses noted. Liver, spleen and kidneys non palpable. Musculoskeletal: Normal range of motion. No signs of joint swelling. No difficulty with gait.  Neurological: Alert and oriented. Cranial nerves II-XII intact. Coordination normal. +DTRs bilaterally. Psychiatric: Mood and affect normal. Behavior is normal. Judgment and thought content normal.   EKG:  BMET    Component Value Date/Time   NA 137 04/28/2015 0839   NA 140 08/25/2014 1241   NA 145 07/23/2011 1101   K 4.9 04/28/2015 0839   K 4.0 08/25/2014  1241   K 3.9 07/23/2011 1101   CL 100 04/28/2015 0839   CL 102 07/24/2012 1450   CL 100 07/23/2011 1101   CO2 28 04/28/2015 0839   CO2 22 08/25/2014 1241   CO2 27 07/23/2011 1101   GLUCOSE 141* 04/28/2015 0839   GLUCOSE 169* 08/25/2014 1241   GLUCOSE 244* 07/24/2012 1450   GLUCOSE 125* 07/23/2011 1101   BUN 29* 04/28/2015 0839   BUN 24.1 08/25/2014 1241   BUN 22 07/23/2011 1101   CREATININE 0.80 04/28/2015 0839   CREATININE 0.8 08/25/2014 1241   CREATININE 1.0 07/23/2011 1101   CALCIUM 10.5 04/28/2015 0839   CALCIUM 9.9 08/25/2014 1241   CALCIUM 9.5 07/23/2011 1101   GFRNONAA >90 08/07/2012 0434   GFRAA >90 08/07/2012 0434    Lipid Panel     Component Value Date/Time   CHOL 159 04/28/2015 0839   TRIG 48.0 04/28/2015 0839   HDL 59.90 04/28/2015 0839   CHOLHDL 3 04/28/2015 0839   VLDL 9.6 04/28/2015 0839   LDLCALC 89 04/28/2015 0839    CBC    Component Value Date/Time   WBC 5.2 11/30/2014 0847   WBC 6.2 08/25/2014 1241   RBC 4.08  11/30/2014 0847   RBC 3.95 08/25/2014 1241   HGB 12.4 11/30/2014 0847   HGB 11.8 08/25/2014 1241   HCT 37.5 11/30/2014 0847   HCT 36.2 08/25/2014 1241   PLT 183.0 11/30/2014 0847   PLT 177 08/25/2014 1241   MCV 91.9 11/30/2014 0847   MCV 91.6 08/25/2014 1241   MCH 30.0 08/25/2014 1241   MCH 30.7 08/07/2012 0434   MCHC 33.0 11/30/2014 0847   MCHC 32.7 08/25/2014 1241   RDW 14.8 11/30/2014 0847   RDW 15.0* 08/25/2014 1241   LYMPHSABS 1.3 11/30/2014 0847   LYMPHSABS 1.6 08/25/2014 1241   MONOABS 0.3 11/30/2014 0847   MONOABS 0.3 08/25/2014 1241   EOSABS 0.2 11/30/2014 0847   EOSABS 0.2 08/25/2014 1241   BASOSABS 0.0 11/30/2014 0847   BASOSABS 0.0 08/25/2014 1241    Hgb A1C Lab Results  Component Value Date   HGBA1C 7.8* 04/28/2015      Assessment and Plan:   Medicare Annual Wellness Visit:  Diet: Endorses a healthy diet. Breakfast: Grits, eggs, bacon Lunch: Fast food, home made meals. Dinner: Scientist, research (physical sciences), vegetable, starch Snacks: Occasionally Beverages: Water, diet coke Desserts: Occasionally Physical activity: She walks 1-2 miles 5 days a week. Depression/mood screen: Negative Hearing: Intact to whispered voice Visual acuity: Grossly normal, performs annual eye exam  ADLs: Capable Fall risk: None Home safety: Good Cognitive evaluation: Intact to orientation, naming, recall and repetition EOL planning: Adv directives, full code. HCPA: Jonetta Speak, daughter.  Preventative Medicine: Declines flu, zostavax, and tetanus even though recommended. Bone density scan and mammogram ordered. Will provide pneumovax at next visit. Discussed the importance of a healthy diet and regular exercise in order for weight loss and to reduce risk of other medical diseases.   Next appointment: 6 months for follow up.

## 2015-05-04 NOTE — Assessment & Plan Note (Signed)
Stable. Continue current regimen. BMP stable.

## 2015-05-04 NOTE — Patient Instructions (Addendum)
It is important that you improve your diet. Please limit carbohydrates in the form of white bread, rice, pasta, cakes, fast food, sugary drinks, etc. Increase your consumption of fresh fruits and vegetables.  You need to consume about 2 liters of water daily.  Continue your exercise regimen. Consider adding an extra 30 minutes to your current time.  You will be contacted regarding your mammogram and bone density testing.  Schedule a lab only appointment in 3 months to recheck your diabetes levels.  Follow up in 6 months for re-evaluation.  It was a pleasure to see you today!

## 2015-05-05 ENCOUNTER — Encounter: Payer: Medicare Other | Admitting: Family Medicine

## 2015-05-11 ENCOUNTER — Other Ambulatory Visit: Payer: Self-pay | Admitting: Family Medicine

## 2015-05-11 NOTE — Telephone Encounter (Signed)
Pt came in office requesting Glimepiride 4 mg tablet. Refill request was sent to Dr. Birdie Riddle but now pt see's Alma Friendly. Pt will be out of medicine in  a few days. Please advise.  [place]  [phone]

## 2015-05-11 NOTE — Telephone Encounter (Signed)
Per DPR, left detail message for patient that prescription have been sent to pharmacy.

## 2015-06-01 ENCOUNTER — Telehealth: Payer: Self-pay | Admitting: Primary Care

## 2015-06-01 DIAGNOSIS — I10 Essential (primary) hypertension: Secondary | ICD-10-CM

## 2015-06-01 MED ORDER — AMLODIPINE BESYLATE 10 MG PO TABS: 10.0000 mg | ORAL_TABLET | Freq: Every day | ORAL | Status: DC

## 2015-06-01 NOTE — Telephone Encounter (Signed)
Pt came in today needing to get a refill on amlodipine 10mg  tab cip [facility] [place]  Pt has only 3-4 pills left

## 2015-06-01 NOTE — Telephone Encounter (Signed)
Noted.  Refill sent to pharmacy. 

## 2015-06-02 NOTE — Telephone Encounter (Signed)
Message left for patient to return my call. Tried to call patient 3 times yesterday but all went straight to voicemail. Tried again this morning and again to voicemail.

## 2015-06-29 ENCOUNTER — Telehealth: Payer: Self-pay | Admitting: Primary Care

## 2015-06-29 ENCOUNTER — Other Ambulatory Visit: Payer: Self-pay | Admitting: Internal Medicine

## 2015-06-29 DIAGNOSIS — Z1231 Encounter for screening mammogram for malignant neoplasm of breast: Secondary | ICD-10-CM

## 2015-06-29 DIAGNOSIS — E2839 Other primary ovarian failure: Secondary | ICD-10-CM

## 2015-06-29 NOTE — Telephone Encounter (Signed)
Noted. Changed diagnosis code.

## 2015-06-29 NOTE — Telephone Encounter (Signed)
i called [facility] [place] The bone density needs to be changed from screening  They said estrogen deficency would work

## 2015-06-30 NOTE — Telephone Encounter (Signed)
Left message asking pt to call office Her appointment @ breast center is 4/5 @ 11/:30 arrive @ 11:10 with insurance card and id Bone density  No calcium the day before or the day of appointment No metal snaps or zippers below waist Mammogram no deo lotion or powders  Pt needs to call end of march to make sure stephanie is working 4/5 they didn't have schedule out for tech

## 2015-07-05 DIAGNOSIS — H40031 Anatomical narrow angle, right eye: Secondary | ICD-10-CM | POA: Diagnosis not present

## 2015-07-05 DIAGNOSIS — H40032 Anatomical narrow angle, left eye: Secondary | ICD-10-CM | POA: Diagnosis not present

## 2015-07-05 DIAGNOSIS — H40022 Open angle with borderline findings, high risk, left eye: Secondary | ICD-10-CM | POA: Diagnosis not present

## 2015-07-06 ENCOUNTER — Telehealth: Payer: Self-pay | Admitting: Primary Care

## 2015-07-06 ENCOUNTER — Telehealth: Payer: Self-pay | Admitting: Nurse Practitioner

## 2015-07-06 DIAGNOSIS — E119 Type 2 diabetes mellitus without complications: Secondary | ICD-10-CM

## 2015-07-06 MED ORDER — METFORMIN HCL 1000 MG PO TABS: 1000.0000 mg | ORAL_TABLET | Freq: Two times a day (BID) | ORAL | Status: DC

## 2015-07-06 NOTE — Telephone Encounter (Signed)
Please notify Ms. Eddings that I've sent refills to [facility].

## 2015-07-06 NOTE — Telephone Encounter (Signed)
Spouse came in needing to get a refill on metformin 1000mg  tab ing Take one tablet by mouth twice dailey with meals [facility]  [address] [place] Pt only has 2 pill left Pt is going out of town tomorrow morning Please let pt know when this has been called in

## 2015-07-06 NOTE — Telephone Encounter (Signed)
Ok with me 

## 2015-07-06 NOTE — Telephone Encounter (Signed)
Patient called to request to switch from Dr. Debara Pickett to Dr. Acie Fredrickson due to the fact that her husband is Dr. Elmarie Shiley patient and she saw him many years ago at [facility].  She states the [facility] office is also more convenient for her.  I advised that I will route a message to both doctors for consent and after consent is received she will need to schedule a follow-up appointment as she is due now for 6 month follow-up.

## 2015-07-06 NOTE — Telephone Encounter (Signed)
Per DPR, left detail message that refills has been sent.

## 2015-07-07 NOTE — Telephone Encounter (Signed)
Please see below and schedule this patient for an appointment with Dr. Acie Fredrickson.  She is overdue for 6 month follow-up.

## 2015-07-07 NOTE — Telephone Encounter (Signed)
Fine with me.  Dr. H 

## 2015-07-19 NOTE — Telephone Encounter (Signed)
Please schedule patient with Dr. Acie Fredrickson

## 2015-07-20 ENCOUNTER — Other Ambulatory Visit: Payer: Medicare Other

## 2015-07-20 ENCOUNTER — Ambulatory Visit: Payer: Medicare Other

## 2015-07-22 ENCOUNTER — Encounter: Payer: Self-pay | Admitting: Cardiovascular Disease

## 2015-07-22 ENCOUNTER — Ambulatory Visit (INDEPENDENT_AMBULATORY_CARE_PROVIDER_SITE_OTHER): Payer: Medicare Other | Admitting: Cardiovascular Disease

## 2015-07-22 VITALS — BP 140/62 | HR 98 | Ht 65.0 in | Wt 197.8 lb

## 2015-07-22 DIAGNOSIS — I10 Essential (primary) hypertension: Secondary | ICD-10-CM | POA: Diagnosis not present

## 2015-07-22 DIAGNOSIS — I480 Paroxysmal atrial fibrillation: Secondary | ICD-10-CM | POA: Diagnosis not present

## 2015-07-22 MED ORDER — METOPROLOL SUCCINATE ER 50 MG PO TB24: 50.0000 mg | ORAL_TABLET | Freq: Every day | ORAL | Status: DC

## 2015-07-22 MED ORDER — APIXABAN 5 MG PO TABS: 5.0000 mg | ORAL_TABLET | Freq: Two times a day (BID) | ORAL | Status: DC

## 2015-07-22 NOTE — Patient Instructions (Addendum)
Medication Instructions:  Your physician has recommended you make the following change in your medication:  1-START eliquis 5 mg by mouth twice daily 2-Increase Toprol 50 mg by mouth daily  Labwork: NONE  Testing/Procedures: NONE  Follow-Up: Your physician wants you to follow-up in: 6 months with Dr. Acie Fredrickson. You will receive a reminder letter in the mail two months in advance. If you don't receive a letter, please call our office to schedule the follow-up appointment.  If you need a refill on your cardiac medications before your next appointment, please call your pharmacy.

## 2015-07-22 NOTE — Progress Notes (Signed)
OFFICE NOTE  Chief Complaint:   Occasional edema  Primary Care Physician: Sheral Flow, NP  Problem list 1. Essential hypertension 2. Atrial fibrillation 3. Diabetes Mellitus    HPI:  Carly Buckley  Is a pleasant 70 year old female kindly referred to me by Dr. Birdie Riddle for palpitations. She recently saw her in the office for a routine physical and was found to have atrial fibrillation. She does have a history of palpitations has gone on for number of years     July 22, 2015:  I'm seeing Keylani again today after a several year absence.   She has been seen by Dr. Debara Pickett in the recent past.   I have seen her and her husband in the past.  No CP or dyspnea.   Does not check her BP at home .  Can tell when she goes into atrial fib - lasts a few minute /  Has been walking 2 miles a day  - has had some recent knee issues and she has slacked off a bit    PMHx:  Past Medical History  Diagnosis Date  . Colon cancer ([place])     colon ca dx 07, colon resection  . Diabetes mellitus   . Hypertension   . Malignant neoplasm of colon   . Neuropathy, idiopathic   . Type II diabetes mellitus without mention of complication   . Helicobacter pylori (H. pylori)   . Diverticulitis   . Arthritis   . Cough 07/24/12    productive, ? color, no fever, congested   . Dysrhythmia     palpitations  . Complication of anesthesia   . PONV (postoperative nausea and vomiting)     Past Surgical History  Procedure Laterality Date  . Colon resection  2007    colon cancer  . Tubal ligation    . Tonsillectomy    . Total knee arthroplasty Right 08/04/2012    Procedure: TOTAL KNEE ARTHROPLASTY;  Surgeon: Gearlean Alf, MD;  Location: [facility];  Service: Orthopedics;  Laterality: Right;  . Steriod injection Left 08/04/2012    Procedure: STEROID INJECTION;  Surgeon: Gearlean Alf, MD;  Location: [facility];  Service: Orthopedics;  Laterality: Left;    FAMHx:  Family History  Problem Relation Age  of Onset  . Diabetes Sister   . Diabetes Brother   . Brain cancer Daughter   . Lung cancer Mother 57  . Kidney failure Father 30    SOCHx:   reports that she quit smoking about 43 years ago. She has never used smokeless tobacco. She reports that she does not drink alcohol or use illicit drugs.  ALLERGIES:  Allergies  Allergen Reactions  . Morphine And Related     Nauseated   . Oxycodone Nausea Only    ROS: A comprehensive review of systems was negative except for: Cardiovascular: positive for lower extremity edema  HOME MEDS: Current Outpatient Prescriptions  Medication Sig Dispense Refill  . amLODipine (NORVASC) 10 MG tablet Take 1 tablet (10 mg total) by mouth daily. 90 tablet 1  . apixaban (ELIQUIS) 5 MG TABS tablet Take 1 tablet (5 mg total) by mouth 2 (two) times daily. 60 tablet 5  . conjugated estrogens (PREMARIN) vaginal cream Place vaginally daily. 0.5-1 gram vaginally 42.5 g 11  . glimepiride (AMARYL) 4 MG tablet TAKE TWO TABLETS BY MOUTH ONCE DAILY 180 tablet 0  . lisinopril-hydrochlorothiazide (PRINZIDE,ZESTORETIC) 20-25 MG tablet TAKE ONE TABLET BY MOUTH ONCE DAILY BEFORE BREAKFAST 90  tablet 3  . metFORMIN (GLUCOPHAGE) 1000 MG tablet Take 1 tablet (1,000 mg total) by mouth 2 (two) times daily with a meal. 180 tablet 1  . metoprolol succinate (TOPROL-XL) 25 MG 24 hr tablet Take 1.5 tablets (37.5 mg total) by mouth daily. 45 tablet 6  . ONE TOUCH ULTRA TEST test strip USE AS DIRECTED TO TEST BLOOD SUGAR 100 each 2  . Propylene Glycol (SYSTANE BALANCE) 0.6 % SOLN Place 1 drop into both eyes daily as needed (for dry eyes).    Marland Kitchen albuterol (PROAIR HFA) 108 (90 BASE) MCG/ACT inhaler Inhale 2 puffs into the lungs every 4 (four) hours as needed for wheezing or shortness of breath. (Patient not taking: Reported on 03/17/2015) 1 Inhaler 6  . nystatin-triamcinolone ointment (MYCOLOG) APPLY 1 APPLICATION  TOPICALLY TO STOMACH TWICE DAILY AS NEEDED FOR  RASH (Patient not taking:  Reported on 03/17/2015) 30 g 3   No current facility-administered medications for this visit.    LABS/IMAGING: No results found for this or any previous visit (from the past 48 hour(s)). No results found.  VITALS: BP 140/62 mmHg  Pulse 98  Ht 5\' 5"  (1.651 m)  Wt 197 lb 12.8 oz (89.721 kg)  BMI 32.92 kg/m2  EXAM: General appearance: alert and no distress Neck: no carotid bruit and no JVD Lungs: clear to auscultation bilaterally Heart: regular rate and rhythm, S1, S2 normal, no murmur, click, rub or gallop Abdomen: soft, non-tender; bowel sounds normal; no masses,  no organomegaly Extremities: extremities normal, atraumatic, no cyanosis or edema Pulses: 2+ and symmetric Skin: Skin color, texture, turgor normal. No rashes or lesions Neurologic: Grossly normal  psych: Pleasant  EKG:  Normal sinus rhythm at 90  ASSESSMENT: 1. Paroxysmal atrial fibrillation:   2. Anticoagulation :  CHADSVASC score of 4 - She is currently on Eliquis. She has tried Xarelto in the past but switched to Eliquis because of cost. I informed her that this year Xarelto seems to be cheaper than Eliquis. She will call her pharmacy and see which one is less expensive. I do not have a preference. 3. HTN:  I've advised her to watch her salt. I have encouraged her to exercise. 4. DM2   Yomaira Solar, Wonda Cheng, MD  07/22/2015 10:22 AM    [facility] [place],  [place], [place]  [address] Pager [phone] Phone: [phone]; Fax: [phone]   [facility]  [address] [place], [place]  [address] [phone]   Fax [phone]

## 2015-07-26 DIAGNOSIS — H40033 Anatomical narrow angle, bilateral: Secondary | ICD-10-CM | POA: Diagnosis not present

## 2015-07-26 DIAGNOSIS — H2513 Age-related nuclear cataract, bilateral: Secondary | ICD-10-CM | POA: Diagnosis not present

## 2015-08-02 ENCOUNTER — Other Ambulatory Visit (INDEPENDENT_AMBULATORY_CARE_PROVIDER_SITE_OTHER): Payer: Medicare Other

## 2015-08-02 DIAGNOSIS — E119 Type 2 diabetes mellitus without complications: Secondary | ICD-10-CM

## 2015-08-02 LAB — HEMOGLOBIN A1C: Hgb A1c MFr Bld: 8 % — ABNORMAL HIGH (ref 4.6–6.5)

## 2015-08-05 ENCOUNTER — Ambulatory Visit
Admission: RE | Admit: 2015-08-05 | Discharge: 2015-08-05 | Disposition: A | Discharge status: Home / Self Care | Payer: Medicare Other | Source: Ambulatory Visit | Attending: Primary Care | Admitting: Primary Care

## 2015-08-05 ENCOUNTER — Ambulatory Visit
Admission: RE | Admit: 2015-08-05 | Discharge: 2015-08-05 | Disposition: A | Discharge status: Home / Self Care | Payer: Medicare Other | Source: Ambulatory Visit | Attending: Internal Medicine | Admitting: Internal Medicine

## 2015-08-05 DIAGNOSIS — E2839 Other primary ovarian failure: Secondary | ICD-10-CM

## 2015-08-05 DIAGNOSIS — Z1231 Encounter for screening mammogram for malignant neoplasm of breast: Secondary | ICD-10-CM

## 2015-08-05 DIAGNOSIS — M85851 Other specified disorders of bone density and structure, right thigh: Secondary | ICD-10-CM | POA: Diagnosis not present

## 2015-08-05 DIAGNOSIS — Z780 Asymptomatic menopausal state: Secondary | ICD-10-CM | POA: Diagnosis not present

## 2015-08-08 ENCOUNTER — Other Ambulatory Visit: Payer: Self-pay | Admitting: Primary Care

## 2015-08-09 ENCOUNTER — Encounter: Payer: Self-pay | Admitting: *Deleted

## 2015-08-25 DIAGNOSIS — H2512 Age-related nuclear cataract, left eye: Secondary | ICD-10-CM | POA: Diagnosis not present

## 2015-10-10 DIAGNOSIS — H2511 Age-related nuclear cataract, right eye: Secondary | ICD-10-CM | POA: Diagnosis not present

## 2015-10-13 DIAGNOSIS — H2511 Age-related nuclear cataract, right eye: Secondary | ICD-10-CM | POA: Diagnosis not present

## 2015-11-01 ENCOUNTER — Ambulatory Visit (INDEPENDENT_AMBULATORY_CARE_PROVIDER_SITE_OTHER): Payer: Medicare Other | Admitting: Primary Care

## 2015-11-01 VITALS — BP 134/64 | HR 81 | Temp 98.2°F | Ht 65.0 in | Wt 197.8 lb

## 2015-11-01 DIAGNOSIS — I10 Essential (primary) hypertension: Secondary | ICD-10-CM

## 2015-11-01 DIAGNOSIS — E669 Obesity, unspecified: Secondary | ICD-10-CM | POA: Diagnosis not present

## 2015-11-01 DIAGNOSIS — E1140 Type 2 diabetes mellitus with diabetic neuropathy, unspecified: Secondary | ICD-10-CM

## 2015-11-01 NOTE — Progress Notes (Signed)
Pre visit review using our clinic review tool, if applicable. No additional management support is needed unless otherwise documented below in the visit note. 

## 2015-11-01 NOTE — Assessment & Plan Note (Signed)
No weight gain or loss since last visit. Overall fair diet but does not exercise. Discussed importance of healthy diet and regular exercise in order for weight loss, especially given medical morbidities.

## 2015-11-01 NOTE — Assessment & Plan Note (Signed)
Last A1c of 8.03 months ago. She would like to defer A1c today for an additional 3 months as she has experienced increased stress and expects her A1c will be above goal. Long discussion today regarding importance of regular exercise and improvements in diet.  Will defer A1c for another 3 months and have her work on diet and exercise. If A1c above goal and we'll consider adding another medication. Continue metformin and glimepiride.

## 2015-11-01 NOTE — Patient Instructions (Signed)
Continue to work on improvements in your diet and try to get in some form of exercise.  Schedule a lab only appointment in 3 months for recheck of your A1C.  Follow up in January 2018 for your annual wellness visit/physical.  It was a pleasure to see you today!  Diabetes Mellitus and Food It is important for you to manage your blood sugar (glucose) level. Your blood glucose level can be greatly affected by what you eat. Eating healthier foods in the appropriate amounts throughout the day at about the same time each day will help you control your blood glucose level. It can also help slow or prevent worsening of your diabetes mellitus. Healthy eating may even help you improve the level of your blood pressure and reach or maintain a healthy weight.  General recommendations for healthful eating and cooking habits include:  Eating meals and snacks regularly. Avoid going long periods of time without eating to lose weight.  Eating a diet that consists mainly of plant-based foods, such as fruits, vegetables, nuts, legumes, and whole grains.  Using low-heat cooking methods, such as baking, instead of high-heat cooking methods, such as deep frying. Work with your dietitian to make sure you understand how to use the Nutrition Facts information on food labels. HOW CAN FOOD AFFECT ME? Carbohydrates Carbohydrates affect your blood glucose level more than any other type of food. Your dietitian will help you determine how many carbohydrates to eat at each meal and teach you how to count carbohydrates. Counting carbohydrates is important to keep your blood glucose at a healthy level, especially if you are using insulin or taking certain medicines for diabetes mellitus. Alcohol Alcohol can cause sudden decreases in blood glucose (hypoglycemia), especially if you use insulin or take certain medicines for diabetes mellitus. Hypoglycemia can be a life-threatening condition. Symptoms of hypoglycemia (sleepiness,  dizziness, and disorientation) are similar to symptoms of having too much alcohol.  If your health care provider has given you approval to drink alcohol, do so in moderation and use the following guidelines:  Women should not have more than one drink per day, and men should not have more than two drinks per day. One drink is equal to:  12 oz of beer.  5 oz of wine.  1 oz of hard liquor.  Do not drink on an empty stomach.  Keep yourself hydrated. Have water, diet soda, or unsweetened iced tea.  Regular soda, juice, and other mixers might contain a lot of carbohydrates and should be counted. WHAT FOODS ARE NOT RECOMMENDED? As you make food choices, it is important to remember that all foods are not the same. Some foods have fewer nutrients per serving than other foods, even though they might have the same number of calories or carbohydrates. It is difficult to get your body what it needs when you eat foods with fewer nutrients. Examples of foods that you should avoid that are high in calories and carbohydrates but low in nutrients include:  Trans fats (most processed foods list trans fats on the Nutrition Facts label).  Regular soda.  Juice.  Candy.  Sweets, such as cake, pie, doughnuts, and cookies.  Fried foods. WHAT FOODS CAN I EAT? Eat nutrient-rich foods, which will nourish your body and keep you healthy. The food you should eat also will depend on several factors, including:  The calories you need.  The medicines you take.  Your weight.  Your blood glucose level.  Your blood pressure level.  Your cholesterol  level. You should eat a variety of foods, including:  Protein.  Lean cuts of meat.  Proteins low in saturated fats, such as fish, egg whites, and beans. Avoid processed meats.  Fruits and vegetables.  Fruits and vegetables that may help control blood glucose levels, such as apples, mangoes, and yams.  Dairy products.  Choose fat-free or low-fat dairy  products, such as milk, yogurt, and cheese.  Grains, bread, pasta, and rice.  Choose whole grain products, such as multigrain bread, whole oats, and brown rice. These foods may help control blood pressure.  Fats.  Foods containing healthful fats, such as nuts, avocado, olive oil, canola oil, and fish. DOES EVERYONE WITH DIABETES MELLITUS HAVE THE SAME MEAL PLAN? Because every person with diabetes mellitus is different, there is not one meal plan that works for everyone. It is very important that you meet with a dietitian who will help you create a meal plan that is just right for you.   This information is not intended to replace advice given to you by your health care provider. Make sure you discuss any questions you have with your health care provider.   Document Released: 12/28/2004 Document Revised: 04/23/2014 Document Reviewed: 02/27/2013 Elsevier Interactive Patient Education [employer].

## 2015-11-01 NOTE — Progress Notes (Signed)
Subjective:    Patient ID: Carly Buckley, female    DOB: 02-25-1946, 70 y.o.   MRN: [id number]  HPI  Carly Buckley is a 70 year old female who presents today for follow up.  1) Type 2 Diabetes: A1C of 8.0 in April 2017. She is currently managed on Metformin 1000 mg BID and Glimepiride 4 mg. She is due for recheck today but is reluctant as she's experienced increased stress over the past 6 months with personal and family issues. Denies numbness, tingling, dizziness.  2) Essential Hypertension: Currently managed on Lisinopril-HCTZ 20/25, Amlodipine 10 mg, and Toprol XL 50 mg. BP stable in the clinic today. Denies chest pain, dizziness, headaches, acute visual changes (recent cataracts removed).  3) Obesity: Last visit in January 2017 she endorsed a poor diet without regular exercise. Since her last visit she's worked on improvements in her diet.  Her diet currently consists of: Breakfast: Egg, grits, bacon Lunch: Soup Dinner: Soup, eats out, Loss adjuster, chartered, Wellsite geologist  Snacks: Fruit Desserts: Occasionally Beverages: Water, green tea, coffee  Exercise: She is not currently exercising  Wt Readings from Last 3 Encounters:  11/01/15 197 lb 12.8 oz (89.721 kg)  07/22/15 197 lb 12.8 oz (89.721 kg)  05/04/15 198 lb 12.8 oz (90.175 kg)     Review of Systems  Respiratory: Negative for shortness of breath.   Cardiovascular: Negative for chest pain and leg swelling.  Neurological: Negative for dizziness, numbness and headaches.  Psychiatric/Behavioral:       Increased stress but does not feel anxious daily.       Past Medical History  Diagnosis Date  . Colon cancer ([place])     colon ca dx 07, colon resection  . Diabetes mellitus   . Hypertension   . Malignant neoplasm of colon   . Neuropathy, idiopathic   . Type II diabetes mellitus without mention of complication   . Helicobacter pylori (H. pylori)   . Diverticulitis   . Arthritis   . Cough 07/24/12    productive, ? color, no fever,  congested   . Dysrhythmia     palpitations  . Complication of anesthesia   . PONV (postoperative nausea and vomiting)      Social History   Social History  . Marital Status: Married    Spouse Name: N/A  . Number of Children: N/A  . Years of Education: N/A   Occupational History  . Not on file.   Social History Main Topics  . Smoking status: Former Smoker    Quit date: 06/18/1972  . Smokeless tobacco: Never Used  . Alcohol Use: No  . Drug Use: No  . Sexual Activity: Not on file   Other Topics Concern  . Not on file   Social History Narrative   Married.   4 children, 7 grandchildren, 3 great grandchildren.   Retired. Once worked at [employer]	[place].   Enjoys walking, puzzle books, spending time with family.    Past Surgical History  Procedure Laterality Date  . Colon resection  2007    colon cancer  . Tubal ligation    . Tonsillectomy    . Total knee arthroplasty Right 08/04/2012    Procedure: TOTAL KNEE ARTHROPLASTY;  Surgeon: Gearlean Alf, MD;  Location[facility];  Service: Orthopedics;  Laterality: Right;  . Steriod injection Left 08/04/2012    Procedure: STEROID INJECTION;  Surgeon: Gearlean Alf, MD;  Location[facility];  Service: Orthopedics;  Laterality: Left;    Family  History  Problem Relation Age of Onset  . Diabetes Sister   . Diabetes Brother   . Brain cancer Daughter   . Lung cancer Mother 26  . Kidney failure Father 15    Allergies  Allergen Reactions  . Morphine And Related     Nauseated   . Oxycodone Nausea Only    Current Outpatient Prescriptions on File Prior to Visit  Medication Sig Dispense Refill  . amLODipine (NORVASC) 10 MG tablet Take 1 tablet (10 mg total) by mouth daily. 90 tablet 1  . apixaban (ELIQUIS) 5 MG TABS tablet Take 1 tablet (5 mg total) by mouth 2 (two) times daily. 60 tablet 11  . conjugated estrogens (PREMARIN) vaginal cream Place vaginally daily. 0.5-1 gram vaginally 42.5 g 11  . glimepiride (AMARYL) 4 MG  tablet TAKE TWO TABLETS BY MOUTH ONCE DAILY 180 tablet 1  . lisinopril-hydrochlorothiazide (PRINZIDE,ZESTORETIC) 20-25 MG tablet TAKE ONE TABLET BY MOUTH ONCE DAILY BEFORE BREAKFAST 90 tablet 3  . metFORMIN (GLUCOPHAGE) 1000 MG tablet Take 1 tablet (1,000 mg total) by mouth 2 (two) times daily with a meal. 180 tablet 1  . metoprolol succinate (TOPROL-XL) 50 MG 24 hr tablet Take 1 tablet (50 mg total) by mouth daily. 30 tablet 11  . nystatin-triamcinolone ointment (MYCOLOG) APPLY 1 APPLICATION  TOPICALLY TO STOMACH TWICE DAILY AS NEEDED FOR  RASH 30 g 3  . ONE TOUCH ULTRA TEST test strip USE AS DIRECTED TO TEST BLOOD SUGAR 100 each 2  . Propylene Glycol (SYSTANE BALANCE) 0.6 % SOLN Place 1 drop into both eyes daily as needed (for dry eyes).     No current facility-administered medications on file prior to visit.    BP 134/64 mmHg  Pulse 81  Temp(Src) 98.2 F (36.8 C) (Oral)  Ht 5\' 5"  (1.651 m)  Wt 197 lb 12.8 oz (89.721 kg)  BMI 32.92 kg/m2  SpO2 95%    Objective:   Physical Exam  Constitutional: She appears well-nourished.  Neck: Neck supple.  Cardiovascular: Normal rate and regular rhythm.   Pulmonary/Chest: Effort normal and breath sounds normal.  Skin: Skin is warm and dry.  Psychiatric: She has a normal mood and affect.          Assessment & Plan:

## 2015-11-01 NOTE — Assessment & Plan Note (Signed)
Stable in the clinic today. Continue Toprol, lisinopril-hydrochlorothiazide, amlodipine. She is asymptomatic

## 2015-11-25 ENCOUNTER — Other Ambulatory Visit: Payer: Self-pay | Admitting: Primary Care

## 2015-11-25 DIAGNOSIS — I10 Essential (primary) hypertension: Secondary | ICD-10-CM

## 2016-01-04 ENCOUNTER — Encounter: Payer: Self-pay | Admitting: Cardiovascular Disease

## 2016-01-18 ENCOUNTER — Ambulatory Visit: Payer: Medicare Other | Admitting: Cardiovascular Disease

## 2016-01-26 ENCOUNTER — Other Ambulatory Visit: Payer: Self-pay | Admitting: Primary Care

## 2016-01-26 DIAGNOSIS — E119 Type 2 diabetes mellitus without complications: Secondary | ICD-10-CM

## 2016-02-01 ENCOUNTER — Other Ambulatory Visit: Payer: Medicare Other

## 2016-02-08 ENCOUNTER — Other Ambulatory Visit: Payer: Self-pay | Admitting: Primary Care

## 2016-02-15 ENCOUNTER — Other Ambulatory Visit (INDEPENDENT_AMBULATORY_CARE_PROVIDER_SITE_OTHER): Payer: Medicare Other

## 2016-02-15 DIAGNOSIS — E119 Type 2 diabetes mellitus without complications: Secondary | ICD-10-CM | POA: Diagnosis not present

## 2016-02-15 LAB — MICROALBUMIN / CREATININE URINE RATIO
CREATININE, U: 18.2 mg/dL
Microalb Creat Ratio: 0.5 mg/g (ref 0.0–30.0)
Microalb, Ur: 0.1 mg/dL (ref 0.0–1.9)

## 2016-02-15 LAB — HEMOGLOBIN A1C: Hgb A1c MFr Bld: 7.9 % — ABNORMAL HIGH (ref 4.6–6.5)

## 2016-02-17 ENCOUNTER — Telehealth: Payer: Self-pay | Admitting: Primary Care

## 2016-02-17 NOTE — Telephone Encounter (Signed)
Spoken and notified patient of Kate's comments. Patient verbalized understanding. 

## 2016-02-17 NOTE — Telephone Encounter (Signed)
Patient returned Chan's call. °

## 2016-03-06 ENCOUNTER — Encounter (INDEPENDENT_AMBULATORY_CARE_PROVIDER_SITE_OTHER): Payer: Self-pay

## 2016-03-06 ENCOUNTER — Ambulatory Visit (INDEPENDENT_AMBULATORY_CARE_PROVIDER_SITE_OTHER): Payer: Medicare Other | Admitting: Cardiovascular Disease

## 2016-03-06 ENCOUNTER — Encounter: Payer: Self-pay | Admitting: Cardiovascular Disease

## 2016-03-06 VITALS — BP 140/70 | HR 87 | Ht 65.5 in | Wt 200.0 lb

## 2016-03-06 DIAGNOSIS — I10 Essential (primary) hypertension: Secondary | ICD-10-CM

## 2016-03-06 DIAGNOSIS — I480 Paroxysmal atrial fibrillation: Secondary | ICD-10-CM | POA: Diagnosis not present

## 2016-03-06 NOTE — Patient Instructions (Signed)
Medication Instructions:  Your physician recommends that you continue on your current medications as directed. Please refer to the Current Medication list given to you today.   Labwork: None Ordered   Testing/Procedures: None Ordered   Follow-Up: Your physician wants you to follow-up in: 6 months with Dr. Nahser.  You will receive a reminder letter in the mail two months in advance. If you don't receive a letter, please call our office to schedule the follow-up appointment.   If you need a refill on your cardiac medications before your next appointment, please call your pharmacy.   Thank you for choosing [facility]! Leodan Bolyard, RN [phone]    

## 2016-03-06 NOTE — Progress Notes (Signed)
OFFICE NOTE  Chief Complaint:   Occasional edema  Primary Care Physician: Sheral Flow, NP  Problem list 1. Essential hypertension 2. Atrial fibrillation 3. Diabetes Mellitus    HPI:  Carly Buckley  Is a pleasant 70 year old female kindly referred to me by Dr. Birdie Riddle for palpitations. She recently saw Carly Buckley in the office for a routine physical and was found to have atrial fibrillation. She does have a history of palpitations has gone on for number of years     July 22, 2015:  I'm seeing Carly Buckley again today after a several year absence.   She has been seen by Dr. Debara Pickett in the recent past.   I have seen Carly Buckley and Carly Buckley husband in the past.  No CP or dyspnea.   Does not check Carly Buckley BP at home .  Can tell when she goes into atrial fib - lasts a few minute /  Has been walking 2 miles a day  - has had some recent knee issues and she has slacked off a bit   03/06/2016:  No pains or problems  PMHx:  Past Medical History:  Diagnosis Date  . Arthritis   . Colon cancer ([place])    colon ca dx 07, colon resection  . Complication of anesthesia   . Cough 07/24/12   productive, ? color, no fever, congested   . Diabetes mellitus   . Diverticulitis   . Dysrhythmia    palpitations  . Helicobacter pylori (H. pylori)   . Hypertension   . Malignant neoplasm of colon   . Neuropathy, idiopathic   . PONV (postoperative nausea and vomiting)   . Type II diabetes mellitus without mention of complication     Past Surgical History:  Procedure Laterality Date  . COLON RESECTION  2007   colon cancer  . STERIOD INJECTION Left 08/04/2012   Procedure: STEROID INJECTION;  Surgeon: Gearlean Alf, MD;  Location: [facility];  Service: Orthopedics;  Laterality: Left;  . TONSILLECTOMY    . TOTAL KNEE ARTHROPLASTY Right 08/04/2012   Procedure: TOTAL KNEE ARTHROPLASTY;  Surgeon: Gearlean Alf, MD;  [facility];  Service: Orthopedics;  Laterality: Right;  . TUBAL LIGATION      FAMHx:  Family  History  Problem Relation Age of Onset  . Lung cancer Mother 60  . Kidney failure Father 25  . Diabetes Sister   . Diabetes Brother   . Brain cancer Daughter     SOCHx:   reports that she quit smoking about 43 years ago. She has never used smokeless tobacco. She reports that she does not drink alcohol or use drugs.  ALLERGIES:  Allergies  Allergen Reactions  . Morphine And Related     Nauseated   . Oxycodone Nausea Only    ROS: A comprehensive review of systems was negative except for: Cardiovascular: positive for lower extremity edema  HOME MEDS: Current Outpatient Prescriptions  Medication Sig Dispense Refill  . amLODipine (NORVASC) 10 MG tablet TAKE ONE TABLET BY MOUTH ONCE DAILY 90 tablet 1  . apixaban (ELIQUIS) 5 MG TABS tablet Take 1 tablet (5 mg total) by mouth 2 (two) times daily. 60 tablet 11  . Calcium Carbonate-Vit D-Min (CALTRATE 600+D PLUS MINERALS) 600-800 MG-UNIT CHEW Chew 1 tablet by mouth daily.    Marland Kitchen conjugated estrogens (PREMARIN) vaginal cream Place vaginally daily. 0.5-1 gram vaginally 42.5 g 11  . glimepiride (AMARYL) 4 MG tablet TAKE TWO TABLETS BY MOUTH ONCE DAILY 180 tablet 1  .  lisinopril-hydrochlorothiazide (PRINZIDE,ZESTORETIC) 20-25 MG tablet TAKE ONE TABLET BY MOUTH ONCE DAILY BEFORE BREAKFAST 90 tablet 3  . metFORMIN (GLUCOPHAGE) 1000 MG tablet Take 1 tablet (1,000 mg total) by mouth 2 (two) times daily with a meal. 180 tablet 1  . metoprolol succinate (TOPROL-XL) 50 MG 24 hr tablet Take 1 tablet (50 mg total) by mouth daily. 30 tablet 11  . nystatin-triamcinolone ointment (MYCOLOG) APPLY 1 APPLICATION  TOPICALLY TO STOMACH TWICE DAILY AS NEEDED FOR  RASH 30 g 3  . ONE TOUCH ULTRA TEST test strip USE AS DIRECTED TO TEST BLOOD SUGAR 100 each 2  . Propylene Glycol (SYSTANE BALANCE) 0.6 % SOLN Place 1 drop into both eyes daily as needed (for dry eyes).     No current facility-administered medications for this visit.     LABS/IMAGING: No results  found for this or any previous visit (from the past 48 hour(s)). No results found.  VITALS: BP 140/70   Pulse 87   Ht 5' 5.5" (1.664 m)   Wt 200 lb (90.7 kg)   SpO2 98%   BMI 32.78 kg/m   EXAM: General appearance: alert and no distress Neck: no carotid bruit and no JVD Lungs: clear to auscultation bilaterally Heart: regular rate and rhythm, S1, S2 normal, no murmur, click, rub or gallop Abdomen: soft, non-tender; bowel sounds normal; no masses,  no organomegaly Extremities: extremities normal, atraumatic, no cyanosis  1+ bilateral edema  Pulses: 2+ and symmetric Skin: Skin color, texture, turgor normal. No rashes or lesions Neurologic: Grossly normal  psych: Pleasant  EKG:  Normal sinus rhythm at 82.  Poor R wave progression   ASSESSMENT: 1. Paroxysmal atrial fibrillation:   - is maintaining NSR  2.  3. Anticoagulation :  CHADSVASC score of 4 - She is currently on Eliquis. She has tried Xarelto in the past but switched to Eliquis because of cost. I informed Carly Buckley that this year Xarelto seems to be cheaper than Eliquis. She will call Carly Buckley pharmacy and see which one is less expensive. I do not have a preference. 4. HTN:  I've advised Carly Buckley to watch Carly Buckley salt. I have encouraged Carly Buckley to exercise. 5. DM2   Mertie Moores, MD  03/06/2016 4:40 PM    [facility] [place],  [place], [place]  [address] Pager [phone] Phone: [phone]; Fax: [phone]   [facility]  [address] [place],   [address] [phone]   Fax [phone]

## 2016-03-07 ENCOUNTER — Other Ambulatory Visit: Payer: Self-pay | Admitting: Primary Care

## 2016-03-07 DIAGNOSIS — E119 Type 2 diabetes mellitus without complications: Secondary | ICD-10-CM

## 2016-04-10 ENCOUNTER — Other Ambulatory Visit: Payer: Self-pay | Admitting: Primary Care

## 2016-05-03 ENCOUNTER — Encounter: Payer: Medicare Other | Admitting: Primary Care

## 2016-05-08 ENCOUNTER — Ambulatory Visit (INDEPENDENT_AMBULATORY_CARE_PROVIDER_SITE_OTHER): Payer: Medicare Other | Admitting: Primary Care

## 2016-05-08 ENCOUNTER — Encounter: Payer: Self-pay | Admitting: Primary Care

## 2016-05-08 ENCOUNTER — Other Ambulatory Visit: Payer: Self-pay | Admitting: Primary Care

## 2016-05-08 VITALS — BP 134/68 | HR 73 | Temp 97.6°F | Ht 66.0 in | Wt 199.8 lb

## 2016-05-08 DIAGNOSIS — Z23 Encounter for immunization: Secondary | ICD-10-CM | POA: Diagnosis not present

## 2016-05-08 DIAGNOSIS — E119 Type 2 diabetes mellitus without complications: Secondary | ICD-10-CM | POA: Diagnosis not present

## 2016-05-08 DIAGNOSIS — Z1231 Encounter for screening mammogram for malignant neoplasm of breast: Secondary | ICD-10-CM | POA: Diagnosis not present

## 2016-05-08 DIAGNOSIS — N898 Other specified noninflammatory disorders of vagina: Secondary | ICD-10-CM | POA: Insufficient documentation

## 2016-05-08 DIAGNOSIS — Z1239 Encounter for other screening for malignant neoplasm of breast: Secondary | ICD-10-CM

## 2016-05-08 DIAGNOSIS — I480 Paroxysmal atrial fibrillation: Secondary | ICD-10-CM | POA: Diagnosis not present

## 2016-05-08 DIAGNOSIS — I10 Essential (primary) hypertension: Secondary | ICD-10-CM | POA: Diagnosis not present

## 2016-05-08 DIAGNOSIS — Z0000 Encounter for general adult medical examination without abnormal findings: Secondary | ICD-10-CM

## 2016-05-08 LAB — COMPREHENSIVE METABOLIC PANEL
ALT: 21 U/L (ref 0–35)
AST: 16 U/L (ref 0–37)
Albumin: 4.6 g/dL (ref 3.5–5.2)
Alkaline Phosphatase: 63 U/L (ref 39–117)
BUN: 25 mg/dL — AB (ref 6–23)
CO2: 27 meq/L (ref 19–32)
CREATININE: 0.77 mg/dL (ref 0.40–1.20)
Calcium: 10 mg/dL (ref 8.4–10.5)
Chloride: 98 mEq/L (ref 96–112)
GFR: 78.67 mL/min (ref >60.00)
GLUCOSE: 123 mg/dL — AB (ref 70–99)
Potassium: 4 mEq/L (ref 3.5–5.1)
SODIUM: 133 meq/L — AB (ref 135–145)
Total Bilirubin: 0.5 mg/dL (ref 0.2–1.2)
Total Protein: 7.8 g/dL (ref 6.0–8.3)

## 2016-05-08 LAB — LIPID PANEL
CHOL/HDL RATIO: 3
Cholesterol: 189 mg/dL (ref 0–200)
HDL: 71.2 mg/dL (ref >39.00)
LDL CALC: 108 mg/dL — AB (ref 0–99)
NonHDL: 117.77
Triglycerides: 51 mg/dL (ref 0.0–149.0)
VLDL: 10.2 mg/dL (ref 0.0–40.0)

## 2016-05-08 LAB — HEMOGLOBIN A1C: Hgb A1c MFr Bld: 7.7 % — ABNORMAL HIGH (ref 4.6–6.5)

## 2016-05-08 NOTE — Assessment & Plan Note (Signed)
Discussed the importance of a healthy diet and regular exercise in order for weight loss, and to reduce the risk of other medical diseases.  

## 2016-05-08 NOTE — Addendum Note (Signed)
Addended by: Jacqualin Combes on: 05/08/2016 03:24 PM   Modules accepted: Orders

## 2016-05-08 NOTE — Patient Instructions (Addendum)
Complete lab work prior to leaving today. I will notify you of your results once received.   You were provided with a pneumonia vaccination today.  You are due for a tetanus, please call your insurance company to ensure they will cover this vaccination.  Your are due for your mammogram in April 2018, please schedule this at your convenience.  Ensure you are taking Calcium with Vitamin D to help prevent bone breakdown.  It is important that you improve your diet. Please limit carbohydrates in the form of white bread, rice, pasta, sweets, fast food, fried food, sugary drinks, etc. Increase your consumption of fresh fruits and vegetables, whole grains, lean protein.  Ensure you are consuming 64 ounces of water daily.  Start exercising. You should be getting 150 minutes of exercise weekly.  Follow up in 6 months for re-evaluation.  It was a pleasure to see you today!

## 2016-05-08 NOTE — Assessment & Plan Note (Signed)
Overall diet has improved, discussed to start exercising. Check A1C today. Pneumovax completed today. Foot exam unremarkable today. Urine microalbumin UTD. Follow up in 6 months.

## 2016-05-08 NOTE — Progress Notes (Signed)
Pre visit review using our clinic review tool, if applicable. No additional management support is needed unless otherwise documented below in the visit note. 

## 2016-05-08 NOTE — Assessment & Plan Note (Addendum)
Regular rate and rhythm, continue Eliquis. Follows semi-annually with cardiology.

## 2016-05-08 NOTE — Assessment & Plan Note (Signed)
Due for Td, she will check with insurance for coverage. Pneumovax due, provided today. Declines Zostavax and influenza vaccinations. Colonoscopy UTD. Declines Pap. Mammogram due in April 2018, ordered. She does have advanced directives completed, HCPA her daughter. Exam today unremarkable. Labs pending.  All recommendations provided to patient at end of visit.  I have personally reviewed and have noted: 1. The patient's medical and social history 2. Their use of alcohol, tobacco or illicit drugs 3. Their current medications and supplements 4. The patient's functional ability including ADL's, fall risks, home safety risks  and hearing or visual impairment. 5. Diet and physical activities 6. Evidence for depression or mood disorder

## 2016-05-08 NOTE — Progress Notes (Signed)
Patient ID: Carly Buckley, female   DOB: Feb 05, 1946, 71 y.o.   MRN: [id number]  HPI: Carly Buckley is a 71 year old female who presents today for her annual wellness visit.  Past Medical History:  Diagnosis Date  . Arthritis   . Colon cancer ([facility])    colon ca dx 07, colon resection  . Complication of anesthesia   . Cough 07/24/12   productive, ? color, no fever, congested   . Diabetes mellitus   . Diverticulitis   . Dysrhythmia    palpitations  . Helicobacter pylori (H. pylori)   . Hypertension   . Malignant neoplasm of colon   . Neuropathy, idiopathic   . PONV (postoperative nausea and vomiting)   . Type II diabetes mellitus without mention of complication     Current Outpatient Prescriptions  Medication Sig Dispense Refill  . amLODipine (NORVASC) 10 MG tablet TAKE ONE TABLET BY MOUTH ONCE DAILY 90 tablet 1  . apixaban (ELIQUIS) 5 MG TABS tablet Take 1 tablet (5 mg total) by mouth 2 (two) times daily. 60 tablet 11  . Calcium Carbonate-Vit D-Min (CALTRATE 600+D PLUS MINERALS) 600-800 MG-UNIT CHEW Chew 1 tablet by mouth daily.    Marland Kitchen conjugated estrogens (PREMARIN) vaginal cream Place vaginally daily. 0.5-1 gram vaginally 42.5 g 11  . glimepiride (AMARYL) 4 MG tablet TAKE TWO TABLETS BY MOUTH ONCE DAILY 180 tablet 1  . lisinopril-hydrochlorothiazide (PRINZIDE,ZESTORETIC) 20-25 MG tablet TAKE ONE TABLET BY MOUTH IN THE MORNING 90 tablet 3  . metFORMIN (GLUCOPHAGE) 1000 MG tablet TAKE ONE TABLET BY MOUTH TWICE DAILY WITH A MEAL 180 tablet 1  . metoprolol succinate (TOPROL-XL) 50 MG 24 hr tablet Take 1 tablet (50 mg total) by mouth daily. 30 tablet 11  . nystatin-triamcinolone ointment (MYCOLOG) APPLY 1 APPLICATION  TOPICALLY TO STOMACH TWICE DAILY AS NEEDED FOR  RASH 30 g 3  . ONE TOUCH ULTRA TEST test strip USE AS DIRECTED TO TEST BLOOD SUGAR 100 each 2  . Propylene Glycol (SYSTANE BALANCE) 0.6 % SOLN Place 1 drop into both eyes daily as needed (for dry eyes).     No current  facility-administered medications for this visit.     Allergies  Allergen Reactions  . Morphine And Related     Nauseated   . Oxycodone Nausea Only    Family History  Problem Relation Age of Onset  . Lung cancer Mother 27  . Kidney failure Father 42  . Diabetes Sister   . Diabetes Brother   . Brain cancer Daughter     Social History   Social History  . Marital status: Married    Spouse name: N/A  . Number of children: N/A  . Years of education: N/A   Occupational History  . Not on file.   Social History Main Topics  . Smoking status: Former Smoker    Quit date: 06/18/1972  . Smokeless tobacco: Never Used  . Alcohol use No  . Drug use: No  . Sexual activity: Not on file   Other Topics Concern  . Not on file   Social History Narrative   Married.   4 children, 7 grandchildren, 3 great grandchildren.   Retired. Once worked at [employer]	[place].   Enjoys walking, puzzle books, spending time with family.    Hospitiliaztions: None  Health Maintenance:    Flu: Declines  Tetanus: Unsure, believes it's been over 10 years.  Pneumovax: Due today.  Prevnar: Completed in August 2016  Zostavax: Declines  Bone Density: Completed in April 2017, osteopenia  Colonoscopy: Completed in 2015  Eye Doctor: Completed in May 2017  Dental Exam: Completed several years ago.  Mammogram: Completed in April 2017.  Pap: Declines given age.  Hep C Screening: Completed today    Providers: Dr. Acie Fredrickson, Cardiology; Dr. Karn Cassis, Optometry; Dr. Katy Fitch, Opthomology; Dr. Margaretmary Eddy, Orthopedics; Carly Buckley, PCP   I have personally reviewed and have noted: 1. The patient's medical and social history 2. Their use of alcohol, tobacco or illicit drugs 3. Their current medications and supplements 4. The patient's functional ability including ADL's, fall risks, home safety risks  and hearing or visual impairment. 5. Diet and physical activities 6. Evidence for depression or mood  disorder  Subjective:   Review of Systems:   Constitutional: Denies fever, malaise, fatigue, headache or abrupt weight changes.  HEENT: Denies eye pain, eye redness, ear pain, ringing in the ears, wax buildup, runny nose, nasal congestion, bloody nose, or sore throat. Respiratory: Denies difficulty breathing, shortness of breath, cough or sputum production.   Cardiovascular: Denies chest pain, chest tightness, palpitations or swelling in the hands or feet.  Gastrointestinal: Denies abdominal pain, bloating, constipation, diarrhea or blood in the stool.  GU: Denies urgency, frequency, pain with urination, burning sensation, blood in urine, odor or discharge. Musculoskeletal: Denies decrease in range of motion, difficulty with gait, muscle pain or joint pain and swelling. Doing well despite osteoarthritis to knees. Skin: Denies redness, rashes, lesions or ulcercations.  Neurological: Denies dizziness, difficulty with memory, difficulty with speech or problems with balance and coordination.  Psychiatric: Denies concerns for anxiety or depression.   No other specific complaints in a complete review of systems (except as listed in HPI above).  Objective:  PE:   BP 134/68   Pulse 73   Temp 97.6 F (36.4 C) (Oral)   Ht 5\' 6"  (1.676 m)   Wt 199 lb 12.8 oz (90.6 kg)   SpO2 97%   BMI 32.25 kg/m  Wt Readings from Last 3 Encounters:  05/08/16 199 lb 12.8 oz (90.6 kg)  03/06/16 200 lb (90.7 kg)  11/01/15 197 lb 12.8 oz (89.7 kg)    General: Appears their stated age, well developed, well nourished in NAD. Skin: Warm, dry and intact. No rashes, lesions or ulcerations noted. HEENT: Head: normal shape and size; Eyes: sclera white, no icterus, conjunctiva pink, PERRLA and EOMs intact; Ears: Tm's gray and intact, normal light reflex; Nose: mucosa pink and moist, septum midline; Throat/Mouth: Teeth present, mucosa pink and moist, no exudate, lesions or ulcerations noted.  Neck: Normal range of  motion. Neck supple, trachea midline. No massses, lumps or thyromegaly present.  Cardiovascular: Normal rate and rhythm. S1,S2 noted.  No murmur, rubs or gallops noted. No JVD or BLE edema. No carotid bruits noted. Pulmonary/Chest: Normal effort and positive vesicular breath sounds. No respiratory distress. No wheezes, rales or ronchi noted.  Abdomen: Soft and nontender. Normal bowel sounds, no bruits noted. No distention or masses noted. Liver, spleen and kidneys non palpable. Musculoskeletal: Normal range of motion. No signs of joint swelling.  Neurological: Alert and oriented. Cranial nerves II-XII intact. Coordination normal. +DTRs bilaterally. Psychiatric: Mood and affect normal. Behavior is normal. Judgment and thought content normal.   EKG:  BMET    Component Value Date/Time   NA 137 04/28/2015 0839   NA 140 08/25/2014 1241   K 4.9 04/28/2015 0839   K 4.0 08/25/2014 1241   CL 100 04/28/2015 0839   CL 102  07/24/2012 1450   CO2 28 04/28/2015 0839   CO2 22 08/25/2014 1241   GLUCOSE 141 (H) 04/28/2015 0839   GLUCOSE 169 (H) 08/25/2014 1241   GLUCOSE 244 (H) 07/24/2012 1450   BUN 29 (H) 04/28/2015 0839   BUN 24.1 08/25/2014 1241   CREATININE 0.80 04/28/2015 0839   CREATININE 0.8 08/25/2014 1241   CALCIUM 10.5 04/28/2015 0839   CALCIUM 9.9 08/25/2014 1241   GFRNONAA >90 08/07/2012 0434   GFRAA >90 08/07/2012 0434    Lipid Panel     Component Value Date/Time   CHOL 159 04/28/2015 0839   TRIG 48.0 04/28/2015 0839   HDL 59.90 04/28/2015 0839   CHOLHDL 3 04/28/2015 0839   VLDL 9.6 04/28/2015 0839   LDLCALC 89 04/28/2015 0839    CBC    Component Value Date/Time   WBC 5.2 11/30/2014 0847   RBC 4.08 11/30/2014 0847   HGB 12.4 11/30/2014 0847   HGB 11.8 08/25/2014 1241   HCT 37.5 11/30/2014 0847   HCT 36.2 08/25/2014 1241   PLT 183.0 11/30/2014 0847   PLT 177 08/25/2014 1241   MCV 91.9 11/30/2014 0847   MCV 91.6 08/25/2014 1241   MCH 30.0 08/25/2014 1241   MCH 30.7  08/07/2012 0434   MCHC 33.0 11/30/2014 0847   RDW 14.8 11/30/2014 0847   RDW 15.0 (H) 08/25/2014 1241   LYMPHSABS 1.3 11/30/2014 0847   LYMPHSABS 1.6 08/25/2014 1241   MONOABS 0.3 11/30/2014 0847   MONOABS 0.3 08/25/2014 1241   EOSABS 0.2 11/30/2014 0847   EOSABS 0.2 08/25/2014 1241   BASOSABS 0.0 11/30/2014 0847   BASOSABS 0.0 08/25/2014 1241    Hgb A1C Lab Results  Component Value Date   HGBA1C 7.9 (H) 02/15/2016      Assessment and Plan:   Medicare Annual Wellness Visit:  Diet: She endorses a fair diet Breakfast: Grits, eggs, [place] bacon Lunch: Sandwich, hamburger Dinner: Frozen dinners, meat, salads Snacks: Occasionally fruit, crackers Desserts: Rarely Beverages: Diet green tea, 4-5 bottles of water daily. Physical activity: Active, does walk occasionally Depression/mood screen: Negative Hearing: Intact to whispered voice Visual acuity: Grossly normal, performs annual eye exam  ADLs: Capable Fall risk: None Home safety: Good Cognitive evaluation: Intact to orientation, naming, recall and repetition EOL planning: Adv directives not completed.  Preventative Medicine: Due for Td, she will check with insurance for coverage. Pneumovax due, provided today. Declines Zostavax and influenza vaccinations. Colonoscopy UTD. Declines Pap. Mammogram due in April 2018, ordered. She does have advanced directives completed, HCPA her daughter. Exam today unremarkable. Labs pending.  All recommendations provided to patient at end of visit.  Next appointment:

## 2016-05-08 NOTE — Assessment & Plan Note (Addendum)
Stable today. Continue Toprol XL and Amlodipine 10 mg.

## 2016-05-08 NOTE — Assessment & Plan Note (Signed)
Using premarin cream PRN.

## 2016-05-09 ENCOUNTER — Telehealth: Payer: Self-pay | Admitting: Primary Care

## 2016-05-09 NOTE — Telephone Encounter (Signed)
Patient returned Chan's call. °

## 2016-05-09 NOTE — Telephone Encounter (Signed)
Spoken and notified patient of Kate's comments. Patient verbalized understanding.  See result note.

## 2016-05-24 ENCOUNTER — Other Ambulatory Visit: Payer: Self-pay | Admitting: Primary Care

## 2016-05-24 DIAGNOSIS — I10 Essential (primary) hypertension: Secondary | ICD-10-CM

## 2016-07-10 ENCOUNTER — Other Ambulatory Visit: Payer: Self-pay | Admitting: *Deleted

## 2016-07-10 DIAGNOSIS — R21 Rash and other nonspecific skin eruption: Secondary | ICD-10-CM

## 2016-07-10 NOTE — Telephone Encounter (Signed)
pts husband came into office and requested medication refill. Last refill 2015 from Dr Birdie Riddle. pls advise

## 2016-07-10 NOTE — Telephone Encounter (Signed)
What was she using this medication for? Please remind her that it's not been prescribed since 2015.

## 2016-07-11 NOTE — Telephone Encounter (Signed)
Per DPR, left detail message of Kate's comments for patient to call back. 

## 2016-07-17 MED ORDER — NYSTATIN-TRIAMCINOLONE 100000-0.1 UNIT/GM-% EX OINT: TOPICAL_OINTMENT | CUTANEOUS | 0 refills | Status: DC

## 2016-07-17 NOTE — Telephone Encounter (Signed)
Will allow one refill as she's used it before, but she will need to schedule an appointment for any additional refills.

## 2016-07-17 NOTE — Telephone Encounter (Signed)
Per DPR, left detail message of Kate's comments for patient to call back. 

## 2016-07-17 NOTE — Telephone Encounter (Signed)
Patient stated that she used it for the heat rash on her stomach. She has used it before for this and it helped. She stated that this medication was expensive years ago when it was prescribed, patient stated maybe the price is better now.

## 2016-07-18 ENCOUNTER — Other Ambulatory Visit: Payer: Self-pay | Admitting: Cardiovascular Disease

## 2016-07-18 NOTE — Telephone Encounter (Signed)
Attempted to contact pt; unable to leave vm 

## 2016-07-19 NOTE — Telephone Encounter (Signed)
Per DPR, left detail message of Kate's comments for patient to call back. 

## 2016-07-23 ENCOUNTER — Encounter: Payer: Self-pay | Admitting: *Deleted

## 2016-07-23 NOTE — Telephone Encounter (Signed)
Patient returned Chan's call.  Please call patient back at [phone].

## 2016-07-23 NOTE — Telephone Encounter (Signed)
Message left for patient to return my call.  

## 2016-07-23 NOTE — Telephone Encounter (Signed)
Lm on pts vm requesting a call back to schedule OV. letter rmailed

## 2016-07-24 ENCOUNTER — Ambulatory Visit (INDEPENDENT_AMBULATORY_CARE_PROVIDER_SITE_OTHER): Payer: Medicare Other | Admitting: Primary Care

## 2016-07-24 ENCOUNTER — Encounter: Payer: Self-pay | Admitting: Primary Care

## 2016-07-24 VITALS — BP 138/86 | HR 91 | Temp 98.2°F | Ht 65.5 in | Wt 200.8 lb

## 2016-07-24 DIAGNOSIS — R21 Rash and other nonspecific skin eruption: Secondary | ICD-10-CM | POA: Diagnosis not present

## 2016-07-24 HISTORY — DX: Rash and other nonspecific skin eruption: R21

## 2016-07-24 MED ORDER — CICLOPIROX OLAMINE 0.77 % EX CREA: TOPICAL_CREAM | Freq: Two times a day (BID) | CUTANEOUS | 0 refills | Status: DC

## 2016-07-24 NOTE — Progress Notes (Signed)
Pre visit review using our clinic review tool, if applicable. No additional management support is needed unless otherwise documented below in the visit note. 

## 2016-07-24 NOTE — Assessment & Plan Note (Signed)
Fungal appearing rash under abdominal folds. Nystatin not covered under insurance, will try Ciclopirox. Discussed indications and directions for use.  Discussed to continue to keep area dry.

## 2016-07-24 NOTE — Patient Instructions (Signed)
Apply Cicloprox cream twice daily to rash.  Please notify me if no improvement in 1-2 weeks.  It was a pleasure to see you today!

## 2016-07-24 NOTE — Progress Notes (Signed)
Subjective:    Patient ID: Carly Buckley, female    DOB: [birth year], 72 y.o.   MRN: [id number]  HPI  Carly Buckley is a 71 year old female who presents today with a chief complaint of rash. Her rash is located under the abdominal folds bilaterally that has been present for the past three weeks. She has a history of these rashes in the past and was treated with Nystatin with improvement. The Nystatin was not covered on her insurance plan recently. She's recently been using A&D ointment with some improvement, but not much.  Her rash is not itchy, but she does have some discomfort to right lateral abdominal fold.   She denies fevers. The rash has not moved to another location. She has been using a cloth underneath her abdominal folds to reduce moisture.  Review of Systems  Constitutional: Negative for fatigue.  Skin: Positive for rash.       Past Medical History:  Diagnosis Date  . Arthritis   . Colon cancer ([facility])    colon ca dx 07, colon resection  . Complication of anesthesia   . Cough 07/24/12   productive, ? color, no fever, congested   . Diabetes mellitus   . Diverticulitis   . Dysrhythmia    palpitations  . Helicobacter pylori (H. pylori)   . Hypertension   . Malignant neoplasm of colon   . Neuropathy, idiopathic   . PONV (postoperative nausea and vomiting)   . Type II diabetes mellitus without mention of complication      Social History   Social History  . Marital status: Married    Spouse name: N/A  . Number of children: N/A  . Years of education: N/A   Occupational History  . Not on file.   Social History Main Topics  . Smoking status: Former Smoker    Quit date: 06/18/1972  . Smokeless tobacco: Never Used  . Alcohol use No  . Drug use: No  . Sexual activity: Not on file   Other Topics Concern  . Not on file   Social History Narrative   Married.   4 children, 7 grandchildren, 3 great grandchildren.   Retired. Once worked at [employer]	[place].   Enjoys  walking, puzzle books, spending time with family.    Past Surgical History:  Procedure Laterality Date  . COLON RESECTION  2007   colon cancer  . STERIOD INJECTION Left 08/04/2012   Procedure: STEROID INJECTION;  Surgeon: Gearlean Alf, MD;  Location: [facility];  Service: Orthopedics;  Laterality: Left;  . TONSILLECTOMY    . TOTAL KNEE ARTHROPLASTY Right 08/04/2012   Procedure: TOTAL KNEE ARTHROPLASTY;  Surgeon: Gearlean Alf, MD;  Location: [facility];  Service: Orthopedics;  Laterality: Right;  . TUBAL LIGATION      Family History  Problem Relation Age of Onset  . Lung cancer Mother 81  . Kidney failure Father 5  . Diabetes Sister   . Diabetes Brother   . Brain cancer Daughter     Allergies  Allergen Reactions  . Morphine And Related     Nauseated   . Oxycodone Nausea Only    Current Outpatient Prescriptions on File Prior to Visit  Medication Sig Dispense Refill  . amLODipine (NORVASC) 10 MG tablet TAKE ONE TABLET BY MOUTH ONCE DAILY 90 tablet 1  . apixaban (ELIQUIS) 5 MG TABS tablet Take 1 tablet (5 mg total) by mouth 2 (two) times daily. 60 tablet 11  .  Calcium Carbonate-Vit D-Min (CALTRATE 600+D PLUS MINERALS) 600-800 MG-UNIT CHEW Chew 1 tablet by mouth daily.    Marland Kitchen conjugated estrogens (PREMARIN) vaginal cream Place vaginally daily. 0.5-1 gram vaginally 42.5 g 11  . glimepiride (AMARYL) 4 MG tablet TAKE TWO TABLETS BY MOUTH ONCE DAILY 180 tablet 1  . lisinopril-hydrochlorothiazide (PRINZIDE,ZESTORETIC) 20-25 MG tablet TAKE ONE TABLET BY MOUTH IN THE MORNING 90 tablet 3  . metFORMIN (GLUCOPHAGE) 1000 MG tablet TAKE ONE TABLET BY MOUTH TWICE DAILY WITH A MEAL 180 tablet 1  . metoprolol succinate (TOPROL-XL) 50 MG 24 hr tablet TAKE ONE TABLET BY MOUTH ONCE DAILY 90 tablet 1  . ONE TOUCH ULTRA TEST test strip USE AS DIRECTED TO TEST BLOOD SUGAR 100 each 2  . Propylene Glycol (SYSTANE BALANCE) 0.6 % SOLN Place 1 drop into both eyes daily as needed (for dry eyes).     No  current facility-administered medications on file prior to visit.     BP 138/86   Pulse 91   Temp 98.2 F (36.8 C) (Oral)   Ht 5' 5.5" (1.664 m)   Wt 200 lb 12.8 oz (91.1 kg)   SpO2 98%   BMI 32.91 kg/m    Objective:   Physical Exam  Constitutional: She appears well-nourished.  Cardiovascular: Normal rate and regular rhythm.   Pulmonary/Chest: Effort normal and breath sounds normal.  Skin: Skin is warm and dry.  Rash under abdominal folds bilaterally. No open lesions. Appears fungal.          Assessment & Plan:

## 2016-08-08 ENCOUNTER — Other Ambulatory Visit: Payer: Self-pay | Admitting: Cardiovascular Disease

## 2016-08-08 ENCOUNTER — Other Ambulatory Visit: Payer: Self-pay | Admitting: Primary Care

## 2016-08-10 ENCOUNTER — Encounter: Payer: Self-pay | Admitting: Primary Care

## 2016-08-14 ENCOUNTER — Encounter: Payer: Self-pay | Admitting: *Deleted

## 2016-08-24 ENCOUNTER — Ambulatory Visit (INDEPENDENT_AMBULATORY_CARE_PROVIDER_SITE_OTHER): Payer: Medicare Other | Admitting: Cardiovascular Disease

## 2016-08-24 ENCOUNTER — Encounter (INDEPENDENT_AMBULATORY_CARE_PROVIDER_SITE_OTHER): Payer: Self-pay

## 2016-08-24 ENCOUNTER — Encounter: Payer: Self-pay | Admitting: Cardiovascular Disease

## 2016-08-24 VITALS — BP 140/78 | HR 103 | Ht 61.0 in | Wt 203.5 lb

## 2016-08-24 DIAGNOSIS — I10 Essential (primary) hypertension: Secondary | ICD-10-CM | POA: Diagnosis not present

## 2016-08-24 DIAGNOSIS — R000 Tachycardia, unspecified: Secondary | ICD-10-CM

## 2016-08-24 DIAGNOSIS — I480 Paroxysmal atrial fibrillation: Secondary | ICD-10-CM

## 2016-08-24 LAB — BASIC METABOLIC PANEL
BUN/Creatinine Ratio: 25 (ref 12–28)
BUN: 20 mg/dL (ref 8–27)
CALCIUM: 9.8 mg/dL (ref 8.7–10.3)
CHLORIDE: 98 mmol/L (ref 96–106)
CO2: 21 mmol/L (ref 18–29)
Creatinine, Ser: 0.8 mg/dL (ref 0.57–1.00)
GFR calc Af Amer: 86 mL/min/{1.73_m2} (ref >59)
GFR calc non Af Amer: 75 mL/min/{1.73_m2} (ref >59)
GLUCOSE: 214 mg/dL — AB (ref 65–99)
POTASSIUM: 4.2 mmol/L (ref 3.5–5.2)
Sodium: 138 mmol/L (ref 134–144)

## 2016-08-24 LAB — CBC WITH DIFFERENTIAL/PLATELET
BASOS ABS: 0 10*3/uL (ref 0.0–0.2)
Basos: 1 %
EOS (ABSOLUTE): 0.2 10*3/uL (ref 0.0–0.4)
Eos: 2 %
HEMOGLOBIN: 12 g/dL (ref 11.1–15.9)
Hematocrit: 36 % (ref 34.0–46.6)
IMMATURE GRANS (ABS): 0 10*3/uL (ref 0.0–0.1)
Immature Granulocytes: 0 %
LYMPHS: 22 %
Lymphocytes Absolute: 1.5 10*3/uL (ref 0.7–3.1)
MCH: 30.5 pg (ref 26.6–33.0)
MCHC: 33.3 g/dL (ref 31.5–35.7)
MCV: 92 fL (ref 79–97)
MONOCYTES: 6 %
Monocytes Absolute: 0.4 10*3/uL (ref 0.1–0.9)
NEUTROS ABS: 4.5 10*3/uL (ref 1.4–7.0)
NEUTROS PCT: 69 %
Platelets: 200 10*3/uL (ref 150–379)
RBC: 3.93 x10E6/uL (ref 3.77–5.28)
RDW: 14.8 % (ref 12.3–15.4)
WBC: 6.6 10*3/uL (ref 3.4–10.8)

## 2016-08-24 LAB — TSH: TSH: 1.37 u[IU]/mL (ref 0.450–4.500)

## 2016-08-24 MED ORDER — METOPROLOL SUCCINATE ER 100 MG PO TB24: 100.0000 mg | ORAL_TABLET | Freq: Every day | ORAL | 3 refills | Status: DC

## 2016-08-24 NOTE — Patient Instructions (Addendum)
Medication Instructions: - Your physician has recommended you make the following change in your medication:  1) Increase toprol (metoprolol succinate) to 100 mg- take one tablet by mouth once daily   Labwork: - Your physician recommends that you have lab work today: BMP/TSH/CBC  Procedures/Testing: - none ordered  Follow-Up: - Your physician recommends that you schedule a follow-up appointment in: 3 months with Dr. Acie Fredrickson (EKG same day)    Any Additional Special Instructions Will Be Listed Below (If Applicable).     If you need a refill on your cardiac medications before your next appointment, please call your pharmacy.

## 2016-08-24 NOTE — Progress Notes (Signed)
OFFICE NOTE  Chief Complaint:   Occasional edema  Primary Care Physician: Pleas Koch, NP  Problem list 1. Essential hypertension 2. Atrial fibrillation 3. Diabetes Mellitus    HPI:  Carly Buckley  Is a pleasant 71 year old female kindly referred to me by Dr. Birdie Riddle for palpitations. She recently saw her in the office for a routine physical and was found to have atrial fibrillation. She does have a history of palpitations has gone on for number of years     July 22, 2015:  I'm seeing Esraa again today after a several year absence.   She has been seen by Dr. Debara Pickett in the recent past.   I have seen her and her husband in the past.  No CP or dyspnea.   Does not check her BP at home .  Can tell when she goes into atrial fib - lasts a few minute /  Has been walking 2 miles a day  - has had some recent knee issues and she has slacked off a bit   03/06/2016:  No pains or problems   Aug 24, 2016: Doing well. Is not exercising as much as she would like .   PMHx:  Past Medical History:  Diagnosis Date  . Arthritis   . Colon cancer ([place])    colon ca dx 07, colon resection  . Complication of anesthesia   . Cough 07/24/12   productive, ? color, no fever, congested   . Diabetes mellitus   . Diverticulitis   . Dysrhythmia    palpitations  . Helicobacter pylori (H. pylori)   . Hypertension   . Malignant neoplasm of colon   . Neuropathy, idiopathic   . PONV (postoperative nausea and vomiting)   . Type II diabetes mellitus without mention of complication     Past Surgical History:  Procedure Laterality Date  . COLON RESECTION  2007   colon cancer  . STERIOD INJECTION Left 08/04/2012   Procedure: STEROID INJECTION;  Surgeon: Gearlean Alf, MD;  Location: [facility];  Service: Orthopedics;  Laterality: Left;  . TONSILLECTOMY    . TOTAL KNEE ARTHROPLASTY Right 08/04/2012   Procedure: TOTAL KNEE ARTHROPLASTY;  Surgeon: Gearlean Alf, MD;  Location: [facility];  Service:  Orthopedics;  Laterality: Right;  . TUBAL LIGATION      FAMHx:  Family History  Problem Relation Age of Onset  . Lung cancer Mother 77  . Kidney failure Father 23  . Diabetes Sister   . Diabetes Brother   . Brain cancer Daughter     SOCHx:   reports that she quit smoking about 44 years ago. She has never used smokeless tobacco. She reports that she does not drink alcohol or use drugs.  ALLERGIES:  Allergies  Allergen Reactions  . Morphine And Related     Nauseated   . Oxycodone Nausea Only    ROS: A comprehensive review of systems was negative except for: Cardiovascular: positive for lower extremity edema  HOME MEDS: Current Outpatient Prescriptions  Medication Sig Dispense Refill  . amLODipine (NORVASC) 10 MG tablet TAKE ONE TABLET BY MOUTH ONCE DAILY 90 tablet 1  . Calcium Carbonate-Vit D-Min (CALTRATE 600+D PLUS MINERALS) 600-800 MG-UNIT CHEW Chew 1 tablet by mouth daily.    . ciclopirox (LOPROX) 0.77 % cream Apply topically 2 (two) times daily. 30 g 0  . conjugated estrogens (PREMARIN) vaginal cream Place vaginally daily. 0.5-1 gram vaginally 42.5 g 11  . ELIQUIS 5 MG  TABS tablet TAKE ONE TABLET BY MOUTH TWICE DAILY 60 tablet 5  . glimepiride (AMARYL) 4 MG tablet TAKE TWO TABLETS BY MOUTH ONCE DAILY 180 tablet 1  . lisinopril-hydrochlorothiazide (PRINZIDE,ZESTORETIC) 20-25 MG tablet TAKE ONE TABLET BY MOUTH IN THE MORNING 90 tablet 3  . metFORMIN (GLUCOPHAGE) 1000 MG tablet TAKE ONE TABLET BY MOUTH TWICE DAILY WITH A MEAL 180 tablet 1  . metoprolol succinate (TOPROL-XL) 50 MG 24 hr tablet TAKE ONE TABLET BY MOUTH ONCE DAILY 90 tablet 1  . ONE TOUCH ULTRA TEST test strip USE AS DIRECTED TO TEST BLOOD SUGAR 100 each 2  . Propylene Glycol (SYSTANE BALANCE) 0.6 % SOLN Place 1 drop into both eyes daily as needed (for dry eyes).     No current facility-administered medications for this visit.     LABS/IMAGING: No results found for this or any previous visit (from the  past 48 hour(s)). No results found.  VITALS: BP 140/78   Pulse (!) 103   Ht 5\' 1"  (1.549 m)   Wt 203 lb 8 oz (92.3 kg)   SpO2 95%   BMI 38.45 kg/m   EXAM: Physical Exam: Blood pressure 140/78, pulse (!) 103, height 5\' 1"  (1.549 m), weight 203 lb 8 oz (92.3 kg), SpO2 95 %. General: Well developed, well nourished, in no acute distress.  Head: Normocephalic, atraumatic, sclera non-icteric, mucus membranes are moist Neck:Supple. Negative for carotid bruits. JVD not elevated. Lungs: Clear bilaterally to auscultation without wheezes, rales, or rhonchi. Breathing is unlabored. Heart: RRR with S1 S2. No murmurs, rubs, or gallops appreciated. Abdomen: Soft, non-tender, non-distended with normoactive bowel sounds. No hepatomegaly. No rebound/guarding. No obvious abdominal masses. Msk:  Strength and tone appear normal for age. Extremities: No clubbing or cyanosis. No edema.  Distal pedal pulses are 2+ and equal bilaterally. Neuro: Alert and oriented X 3. Moves all extremities spontaneously. Psych:  Responds to questions appropriately with a normal affect.   EKG:  Assessment/plan 1. Paroxysmal atrial fibrillation. Continue close. She has maintained sinus rhythm. She does have some sinus tachycardia today. Her CHADS2VASC score is 4 - continue Eliquis   2. Hypertension: Continue current medications. We will be increasing the metoprolol XL because of her sinus tachycardia. I've encouraged her to watch her salt intake.  3. Type 2 diabetes mellitus with hypertension: Followed by her primary medical doctor. She does not measure her glucose levels at home.   4. Sinus tachycardia: She has been taking her medications regular. She has a persistently elevated heart rate today. We will check a TSH, CBC and a significant medical profile.    Mertie Moores, MD  08/24/2016 9:16 AM    [address],  [place], [place]  [address] Pager [phone] Phone:  [phone]; Fax: [phone]

## 2016-11-05 ENCOUNTER — Encounter: Payer: Self-pay | Admitting: Primary Care

## 2016-11-05 ENCOUNTER — Ambulatory Visit (INDEPENDENT_AMBULATORY_CARE_PROVIDER_SITE_OTHER): Payer: Medicare Other | Admitting: Primary Care

## 2016-11-05 VITALS — BP 138/86 | HR 77 | Temp 98.4°F | Ht 65.5 in | Wt 200.8 lb

## 2016-11-05 DIAGNOSIS — E785 Hyperlipidemia, unspecified: Secondary | ICD-10-CM | POA: Diagnosis not present

## 2016-11-05 DIAGNOSIS — E119 Type 2 diabetes mellitus without complications: Secondary | ICD-10-CM | POA: Diagnosis not present

## 2016-11-05 DIAGNOSIS — I10 Essential (primary) hypertension: Secondary | ICD-10-CM | POA: Diagnosis not present

## 2016-11-05 DIAGNOSIS — R000 Tachycardia, unspecified: Secondary | ICD-10-CM

## 2016-11-05 DIAGNOSIS — I480 Paroxysmal atrial fibrillation: Secondary | ICD-10-CM | POA: Diagnosis not present

## 2016-11-05 LAB — LIPID PANEL
CHOL/HDL RATIO: 3
CHOLESTEROL: 160 mg/dL (ref 0–200)
HDL: 56.6 mg/dL (ref >39.00)
LDL CALC: 93 mg/dL (ref 0–99)
NonHDL: 103.84
TRIGLYCERIDES: 55 mg/dL (ref 0.0–149.0)
VLDL: 11 mg/dL (ref 0.0–40.0)

## 2016-11-05 LAB — HEMOGLOBIN A1C: Hgb A1c MFr Bld: 8.8 % — ABNORMAL HIGH (ref 4.6–6.5)

## 2016-11-05 NOTE — Assessment & Plan Note (Signed)
Encouraged regular exercise and improvement in diet. A1C pending today. Continue current regimen for now. Consider adding another oral agent vs Lantus HS if A1C above goal. Foot exam UTD. Managed on ACE.

## 2016-11-05 NOTE — Assessment & Plan Note (Signed)
Following with ortho, getting cortisone injections.

## 2016-11-05 NOTE — Progress Notes (Signed)
Subjective:    Patient ID: Carly Buckley, female    DOB: 12/03/1945, 71 y.o.   MRN: [id number]  HPI  Carly Buckley is a 71 year old female who presents today for follow up.  1) Essential Hypertension: Currently managed on amlodipine 10 mg, lisinopril-HCTZ 20/25 mg, and metoprolol succinate 100 mg. Her BP in the office today is 138/86. She denies chest pain. Her cardiologist increased her Toprol XL during her last visit in May due to tachycardia. Overall she's done well with the dose increase.   2) Type 2 Diabetes: Currently managed on glimepiride 8 mg and metformin 1000 mg BID. Her last A1C was 7.7 in January 2018. Her LDL was above goal during her last visit in January so it was recommended she start a low dose statin for cardiac protection, she kindly declined. She's not checking her blood sugars. She does have intermittent tingling to her plantar feet, this has been worse since chemotherapy. She's been getting cortisone injections several times annually for chronic left knee pain.   3) Atrial Fibrillation: Currently managed on Eliquis 5 mg and metoprolol succinate 100 mg. She denies chest pain, palpitations. Metoprolol Succinate was increased per cardiology in May 2018.  Review of Systems  Constitutional: Negative for fatigue.  Respiratory: Negative for shortness of breath.   Cardiovascular: Negative for chest pain and palpitations.  Musculoskeletal: Positive for arthralgias.  Neurological: Positive for numbness. Negative for dizziness.       Past Medical History:  Diagnosis Date  . Arthritis   . Colon cancer ([place])    colon ca dx 07, colon resection  . Complication of anesthesia   . Cough 07/24/12   productive, ? color, no fever, congested   . Diabetes mellitus   . Diverticulitis   . Dysrhythmia    palpitations  . Helicobacter pylori (H. pylori)   . Hypertension   . Malignant neoplasm of colon   . Neuropathy, idiopathic   . PONV (postoperative nausea and vomiting)   . Type II  diabetes mellitus without mention of complication      Social History   Social History  . Marital status: Married    Spouse name: N/A  . Number of children: N/A  . Years of education: N/A   Occupational History  . Not on file.   Social History Main Topics  . Smoking status: Former Smoker    Quit date: 06/18/1972  . Smokeless tobacco: Never Used  . Alcohol use No  . Drug use: No  . Sexual activity: Not on file   Other Topics Concern  . Not on file   Social History Narrative   Married.   4 children, 7 grandchildren, 3 great grandchildren.   Retired. Once worked at [employer]	[place].   Enjoys walking, puzzle books, spending time with family.    Past Surgical History:  Procedure Laterality Date  . COLON RESECTION  2007   colon cancer  . STERIOD INJECTION Left 08/04/2012   Procedure: STEROID INJECTION;  Surgeon: Gearlean Alf, MD;  Location: [facility];  Service: Orthopedics;  Laterality: Left;  . TONSILLECTOMY    . TOTAL KNEE ARTHROPLASTY Right 08/04/2012   Procedure: TOTAL KNEE ARTHROPLASTY;  Surgeon: Gearlean Alf, MD;  Location: [facility];  Service: Orthopedics;  Laterality: Right;  . TUBAL LIGATION      Family History  Problem Relation Age of Onset  . Lung cancer Mother 57  . Kidney failure Father 29  . Diabetes Sister   .  Diabetes Brother   . Brain cancer Daughter     Allergies  Allergen Reactions  . Morphine And Related     Nauseated   . Oxycodone Nausea Only    Current Outpatient Prescriptions on File Prior to Visit  Medication Sig Dispense Refill  . amLODipine (NORVASC) 10 MG tablet TAKE ONE TABLET BY MOUTH ONCE DAILY 90 tablet 1  . Calcium Carbonate-Vit D-Min (CALTRATE 600+D PLUS MINERALS) 600-800 MG-UNIT CHEW Chew 1 tablet by mouth daily.    . ciclopirox (LOPROX) 0.77 % cream Apply topically 2 (two) times daily. 30 g 0  . conjugated estrogens (PREMARIN) vaginal cream Place vaginally daily. 0.5-1 gram vaginally 42.5 g 11  . ELIQUIS 5 MG TABS tablet  TAKE ONE TABLET BY MOUTH TWICE DAILY 60 tablet 5  . glimepiride (AMARYL) 4 MG tablet TAKE TWO TABLETS BY MOUTH ONCE DAILY 180 tablet 1  . lisinopril-hydrochlorothiazide (PRINZIDE,ZESTORETIC) 20-25 MG tablet TAKE ONE TABLET BY MOUTH IN THE MORNING 90 tablet 3  . metFORMIN (GLUCOPHAGE) 1000 MG tablet TAKE ONE TABLET BY MOUTH TWICE DAILY WITH A MEAL 180 tablet 1  . metoprolol succinate (TOPROL-XL) 100 MG 24 hr tablet Take 1 tablet (100 mg total) by mouth daily. Take with or immediately following a meal. 90 tablet 3  . ONE TOUCH ULTRA TEST test strip USE AS DIRECTED TO TEST BLOOD SUGAR 100 each 2  . Propylene Glycol (SYSTANE BALANCE) 0.6 % SOLN Place 1 drop into both eyes daily as needed (for dry eyes).     No current facility-administered medications on file prior to visit.     BP 138/86   Pulse 77   Temp 98.4 F (36.9 C) (Oral)   Ht 5' 5.5" (1.664 m)   Wt 200 lb 12.8 oz (91.1 kg)   SpO2 98%   BMI 32.91 kg/m    Objective:   Physical Exam  Constitutional: She appears well-nourished.  Neck: Neck supple.  Cardiovascular: Normal rate and regular rhythm.   Pulmonary/Chest: Effort normal and breath sounds normal.  Skin: Skin is warm and dry.          Assessment & Plan:

## 2016-11-05 NOTE — Assessment & Plan Note (Signed)
Improved on increased dose of Toprol XL. Continue 100 mg.

## 2016-11-05 NOTE — Assessment & Plan Note (Signed)
Rate and rhythm regular today. Continue Eliquis and Toprol XL 100 mg.

## 2016-11-05 NOTE — Patient Instructions (Addendum)
Complete lab work prior to leaving today. I will notify you of your results once received.   Continue to work on healthy diet and regular exercise.   Follow up in 6 months for your Medicare Wellness Visit and follow up.  It was a pleasure to see you today!

## 2016-11-05 NOTE — Assessment & Plan Note (Signed)
Stable today, continue current regimen. 

## 2016-11-07 ENCOUNTER — Other Ambulatory Visit: Payer: Self-pay | Admitting: Primary Care

## 2016-11-07 DIAGNOSIS — E119 Type 2 diabetes mellitus without complications: Secondary | ICD-10-CM

## 2016-11-08 ENCOUNTER — Encounter: Payer: Self-pay | Admitting: *Deleted

## 2016-11-14 ENCOUNTER — Telehealth: Payer: Self-pay | Admitting: Primary Care

## 2016-11-14 NOTE — Telephone Encounter (Signed)
Message left for patient to return my call.  

## 2016-11-14 NOTE — Telephone Encounter (Signed)
Spoken to patient. She was out of town for a few weeks. Patient stated that she has been getting injection in her knee and was told this would make her blood sugar goes up. Patient stated that she would like to work on her diet and exercise first.  Patient did schedule lab appt on 02/05/2017

## 2016-11-14 NOTE — Telephone Encounter (Signed)
Noted  

## 2016-11-14 NOTE — Telephone Encounter (Signed)
Pt called to discuss recent labs. She received the letter and would like to discuss, she is requesting a cb.

## 2016-11-19 ENCOUNTER — Other Ambulatory Visit: Payer: Self-pay | Admitting: Primary Care

## 2016-11-19 DIAGNOSIS — I10 Essential (primary) hypertension: Secondary | ICD-10-CM

## 2016-11-22 LAB — HM DIABETES EYE EXAM

## 2016-11-26 ENCOUNTER — Encounter: Payer: Self-pay | Admitting: Primary Care

## 2016-12-14 ENCOUNTER — Ambulatory Visit (INDEPENDENT_AMBULATORY_CARE_PROVIDER_SITE_OTHER): Payer: Medicare Other | Admitting: Cardiovascular Disease

## 2016-12-14 ENCOUNTER — Encounter: Payer: Self-pay | Admitting: Cardiovascular Disease

## 2016-12-14 ENCOUNTER — Encounter (INDEPENDENT_AMBULATORY_CARE_PROVIDER_SITE_OTHER): Payer: Self-pay

## 2016-12-14 VITALS — BP 142/60 | HR 84 | Ht 65.0 in | Wt 197.8 lb

## 2016-12-14 DIAGNOSIS — I480 Paroxysmal atrial fibrillation: Secondary | ICD-10-CM | POA: Diagnosis not present

## 2016-12-14 DIAGNOSIS — I10 Essential (primary) hypertension: Secondary | ICD-10-CM | POA: Diagnosis not present

## 2016-12-14 NOTE — Patient Instructions (Signed)
Medication Instructions:  Your physician recommends that you continue on your current medications as directed. Please refer to the Current Medication list given to you today.   Labwork: None Ordered   Testing/Procedures: None Ordered   Follow-Up: Your physician wants you to follow-up in: 6 months with Dr. Nahser.  You will receive a reminder letter in the mail two months in advance. If you don't receive a letter, please call our office to schedule the follow-up appointment.   If you need a refill on your cardiac medications before your next appointment, please call your pharmacy.   Thank you for choosing [facility]! Jacquelin Krajewski, RN [phone]    

## 2016-12-14 NOTE — Progress Notes (Signed)
OFFICE NOTE  Chief Complaint:   Occasional edema  Primary Care Physician: Pleas Koch, NP  Problem list 1. Essential hypertension 2. Atrial fibrillation 3. Diabetes Mellitus    HPI:  Carly Buckley  Is a pleasant 71 year old female kindly referred to me by Dr. Birdie Riddle for palpitations. She recently saw her in the office for a routine physical and was found to have atrial fibrillation. She does have a history of palpitations has gone on for number of years     July 22, 2015:  I'm seeing Carly Buckley again today after a several year absence.   She has been seen by Dr. Debara Pickett in the recent past.   I have seen her and her husband in the past.  No CP or dyspnea.   Does not check her BP at home .  Can tell when she goes into atrial fib - lasts a few minute /  Has been walking 2 miles a day  - has had some recent knee issues and she has slacked off a bit   03/06/2016:  No pains or problems   Aug 24, 2016: Doing well. Is not exercising as much as she would like .  Aug. 31, 2018: Lynsay is seen today for follow up of her paroxysmal atrial fib . Walks with a cane now.  Has neuropathy - helps with balance  Is maintaining NSR    PMHx:  Past Medical History:  Diagnosis Date  . Arthritis   . Colon cancer ([place])    colon ca dx 07, colon resection  . Complication of anesthesia   . Cough 07/24/12   productive, ? color, no fever, congested   . Diabetes mellitus   . Diverticulitis   . Dysrhythmia    palpitations  . Helicobacter pylori (H. pylori)   . Hypertension   . Malignant neoplasm of colon   . Neuropathy, idiopathic   . PONV (postoperative nausea and vomiting)   . Type II diabetes mellitus without mention of complication     Past Surgical History:  Procedure Laterality Date  . COLON RESECTION  2007   colon cancer  . STERIOD INJECTION Left 08/04/2012   Procedure: STEROID INJECTION;  Surgeon: Gearlean Alf, MD;  Location: [facility];  Service: Orthopedics;  Laterality:  Left;  . TONSILLECTOMY    . TOTAL KNEE ARTHROPLASTY Right 08/04/2012   Procedure: TOTAL KNEE ARTHROPLASTY;  Surgeon: Gearlean Alf, MD;  Location: [facility];  Service: Orthopedics;  Laterality: Right;  . TUBAL LIGATION      FAMHx:  Family History  Problem Relation Age of Onset  . Lung cancer Mother 29  . Kidney failure Father 31  . Diabetes Sister   . Diabetes Brother   . Brain cancer Daughter     SOCHx:   reports that she quit smoking about 44 years ago. She has never used smokeless tobacco. She reports that she does not drink alcohol or use drugs.  ALLERGIES:  Allergies  Allergen Reactions  . Morphine And Related     Nauseated   . Oxycodone Nausea Only    ROS: A comprehensive review of systems was negative except for: Cardiovascular: positive for lower extremity edema  HOME MEDS: Current Outpatient Prescriptions  Medication Sig Dispense Refill  . amLODipine (NORVASC) 10 MG tablet TAKE 1 TABLET BY MOUTH ONCE DAILY 90 tablet 1  . Calcium Carbonate-Vit D-Min (CALTRATE 600+D PLUS MINERALS) 600-800 MG-UNIT CHEW Chew 1 tablet by mouth daily.    . ciclopirox (  LOPROX) 0.77 % cream Apply topically 2 (two) times daily. 30 g 0  . conjugated estrogens (PREMARIN) vaginal cream Place vaginally daily. 0.5-1 gram vaginally 42.5 g 11  . ELIQUIS 5 MG TABS tablet TAKE ONE TABLET BY MOUTH TWICE DAILY 60 tablet 5  . glimepiride (AMARYL) 4 MG tablet TAKE TWO TABLETS BY MOUTH ONCE DAILY 180 tablet 1  . lisinopril-hydrochlorothiazide (PRINZIDE,ZESTORETIC) 20-25 MG tablet TAKE ONE TABLET BY MOUTH IN THE MORNING 90 tablet 3  . metFORMIN (GLUCOPHAGE) 1000 MG tablet TAKE ONE TABLET BY MOUTH TWICE DAILY WITH MEALS 180 tablet 1  . metoprolol succinate (TOPROL-XL) 100 MG 24 hr tablet Take 1 tablet (100 mg total) by mouth daily. Take with or immediately following a meal. 90 tablet 3  . ONE TOUCH ULTRA TEST test strip USE AS DIRECTED TO TEST BLOOD SUGAR 100 each 2  . Propylene Glycol (SYSTANE BALANCE)  0.6 % SOLN Place 1 drop into both eyes daily as needed (for dry eyes).     No current facility-administered medications for this visit.     LABS/IMAGING: No results found for this or any previous visit (from the past 48 hour(s)). No results found.  VITALS: BP (!) 142/60   Pulse 84   Ht 5\' 5"  (1.651 m)   Wt 197 lb 12.8 oz (89.7 kg)   SpO2 98%   BMI 32.92 kg/m   EXAM: Physical Exam: Blood pressure (!) 142/60, pulse 84, height 5\' 5"  (1.651 m), weight 197 lb 12.8 oz (89.7 kg), SpO2 98 %. General: Well developed, well nourished, in no acute distress.  Head: Normocephalic, atraumatic, sclera non-icteric, mucus membranes are moist Neck:Supple. No carotid bruits.  No JVD . Marland Kitchen Lungs: Clear bilaterally to auscultation without wheezes, rales, or rhonchi. Breathing is unlabored. Heart:   RR,  Very soft systolic murmur  Abdomen: Soft, non-tender, non-distended with normoactive bowel sounds. No hepatomegaly. No rebound/guarding. No obvious abdominal masses. Msk:  Strength and tone appear normal for age. Extremities: No clubbing or cyanosis. Trace edema .  Distal pedal pulses are 2+ and equal bilaterally. Neuro: A/O .   Generally weak.  Nonfocal.  Psych:  Responds to questions appropriately with a normal affect.   EKG:  Aug. 31,2018:   NSR at 98 with 1st degree AV block .  Poor R wave progression   Assessment/plan 1. Paroxysmal atrial fibrillation.    . She has maintained sinus rhythm.   Her CHADS2VASC score is 4 - continue Eliquis   2. Hypertension: Continue current medications.    HR is well controlled.   Still eats lots of salty foods.  I've encouraged her to watch her salt intake.  3. Type 2 diabetes mellitus with hypertension: Followed by her primary medical doctor. She does not measure her glucose levels at home.     Mertie Moores, MD  12/14/2016 9:41 AM    [address],  [place], [place]  [address] Pager [phone] Phone:  [phone]; Fax: [phone]

## 2016-12-18 NOTE — Addendum Note (Signed)
Addended by: Briant Cedar on: 12/18/2016 12:18 PM   Modules accepted: Orders

## 2017-02-05 ENCOUNTER — Other Ambulatory Visit (INDEPENDENT_AMBULATORY_CARE_PROVIDER_SITE_OTHER): Payer: Medicare Other

## 2017-02-05 DIAGNOSIS — E119 Type 2 diabetes mellitus without complications: Secondary | ICD-10-CM | POA: Diagnosis not present

## 2017-02-05 LAB — HEMOGLOBIN A1C: HEMOGLOBIN A1C: 8.5 % — AB (ref 4.6–6.5)

## 2017-02-06 ENCOUNTER — Encounter: Payer: Self-pay | Admitting: *Deleted

## 2017-02-13 ENCOUNTER — Telehealth: Payer: Self-pay | Admitting: Primary Care

## 2017-02-13 NOTE — Telephone Encounter (Signed)
Per DPR, left detail message of Kate's comments for patient to call back. 

## 2017-02-13 NOTE — Telephone Encounter (Signed)
Copied from [place] [id number]. Topic: Quick Communication - See Telephone Encounter >> Feb 13, 2017 10:05 AM Bea Graff, NT wrote: CRM for notification. See Telephone encounter for:  02/13/17. Patient calling to speak with Vallarie Mare about her lab results.

## 2017-02-18 NOTE — Telephone Encounter (Signed)
Per DPR, left detail message of Kate's comments for patient to call back.  Notes recorded by Pleas Koch, NP on 02/05/2017 at 1:13 PM EDT  Please notify patient:  A1C is about the same, slightly improved but overall not at goal. We have 2 options: 1. Add in another pill for diabetes. 2. Add in insulin at bedtime and get rid of a pill.  Let me know what she decides.

## 2017-02-20 ENCOUNTER — Ambulatory Visit: Payer: Self-pay

## 2017-02-20 NOTE — Telephone Encounter (Signed)
At this point, I would increase Amaryl to 4 mg BID. Can we send in a new RX with the updated instructions, quantity #60, 2 refills. Follow up with Anda Kraft in 3 months for repeat A1C.

## 2017-02-20 NOTE — Telephone Encounter (Signed)
Pt. Given lab results and Tawni Millers options for treatment. Pt. States she would rather add a pill instead of adding insulin at bedtime. Requests we call her back with what the medication will be.

## 2017-02-21 MED ORDER — GLIMEPIRIDE 4 MG PO TABS: 4.0000 mg | ORAL_TABLET | Freq: Two times a day (BID) | ORAL | 2 refills | Status: DC

## 2017-02-21 NOTE — Telephone Encounter (Signed)
Noted and agree. 

## 2017-02-21 NOTE — Addendum Note (Signed)
Addended by: Jacqualin Combes on: 02/21/2017 01:29 PM   Modules accepted: Orders

## 2017-02-21 NOTE — Telephone Encounter (Signed)
Spoken and notified patient of Carly Buckley's comments. Patient verbalized understanding.  Patient is agreeable to Amaryl 4 mg BID and Rx has been sent.  Patient will call back to schedule the follow up. She does not want to schedule it right now.

## 2017-02-22 ENCOUNTER — Telehealth: Payer: Self-pay | Admitting: Primary Care

## 2017-02-22 NOTE — Telephone Encounter (Signed)
Copied from [place] [id number]. Topic: Quick Communication - See Telephone Encounter >> Feb 22, 2017 10:39 AM Hewitt Shorts wrote: CRM for notification. See Telephone encounter for: pt is wanting to talk back to [place] because the glipizide she is already taking and the pharmacy is stating that insurance will not cover it   Best number [phone] 02/22/17.

## 2017-02-25 ENCOUNTER — Other Ambulatory Visit: Payer: Self-pay | Admitting: Primary Care

## 2017-02-25 ENCOUNTER — Telehealth: Payer: Self-pay

## 2017-02-25 DIAGNOSIS — E1140 Type 2 diabetes mellitus with diabetic neuropathy, unspecified: Secondary | ICD-10-CM

## 2017-02-25 MED ORDER — GLIPIZIDE 10 MG PO TABS
10.0000 mg | ORAL_TABLET | Freq: Two times a day (BID) | ORAL | 1 refills | Status: DC
Start: 2017-02-25 — End: 2017-08-20

## 2017-02-25 NOTE — Telephone Encounter (Signed)
Unable to reach pt by either contact #.

## 2017-02-25 NOTE — Telephone Encounter (Signed)
Noted. Agree with triage plan. Will await for her call back.

## 2017-02-25 NOTE — Telephone Encounter (Signed)
Patient is not on glipizide, she is taking glimepiride. What is she saying, the prior message doesn't make sense.  Looks like we attempted to call her several times to discuss her A1C, see result note from A1C on 02/05/17.

## 2017-02-25 NOTE — Telephone Encounter (Signed)
PLEASE NOTE: All timestamps contained within this report are represented as [place] Standard Time. CONFIDENTIALTY NOTICE: This fax transmission is intended only for the addressee. It contains information that is legally privileged, confidential or otherwise protected from use or disclosure. If you are not the intended recipient, you are strictly prohibited from reviewing, disclosing, copying using or disseminating any of this information or taking any action in reliance on or regarding this information. If you have received this fax in error, please notify us immediately by telephone so that we can arrange for its return to [place]. Phone: [phone], Toll-Free: [phone], Fax: [phone] Page: 1 of 2 Call Id: [id number] [place] Patient Name: Carly Buckley Gender: Female DOB: 1945-10-22 Age: 72 Y 2 M 19 D Return Phone Number: [phone] (Primary), [phone] (Secondary) Address: City/State/Zip: [place] [address] Client [place] Night - Client Client Site [place] Physician Alma Friendly - NP Contact Type Call Who Is Calling Patient / Member / Family / Caregiver Call Type Triage / Clinical Caller Name Kazuko Clemence Relationship To Patient Mother Return Phone Number [phone] (Primary) Chief Complaint Rash - Widespread Reason for Call Symptomatic / Request for [place] states she has a rash all over her legs. Translation No Nurse Assessment Nurse: Tawanna Solo, RN, Vaughan Basta Date/Time (Eastern Time): 02/23/2017 9:58:47 AM Confirm and document reason for call. If symptomatic, describe symptoms. ---Caller has a red raised rash on her lower legs since Wednesday, but worse today. Rash is itchy. Does the patient have any new or worsening symptoms? ---Yes Will a triage be completed? ---Yes Related  visit to physician within the last 2 weeks? ---No Does the PT have any chronic conditions? (i.e. diabetes, asthma, etc.) ---Yes List chronic conditions. ---DM HTN Palpitations Is this a behavioral health or substance abuse call? ---No Guidelines Guideline Title Affirmed Question Affirmed Notes Nurse Date/Time (Eastern Time) Rash or Redness - Localized Mild localized rash Tawanna Solo, RN, Vaughan Basta 02/23/2017 10:02:30 AM Disp. Time Eilene Ghazi Time) Disposition Final User 02/23/2017 10:07:45 AM Call Completed Tawanna Solo, RN, Vaughan Basta 02/23/2017 10:07:31 AM Home Care Yes Tawanna Solo, RN, Phineas Semen Disagree/Comply Comply Caller Understands Yes PreDisposition Go to Urgent Care/Walk-In Clinic PLEASE NOTE: All timestamps contained within this report are represented as [place] Standard Time. CONFIDENTIALTY NOTICE: This fax transmission is intended only for the addressee. It contains information that is legally privileged, confidential or otherwise protected from use or disclosure. If you are not the intended recipient, you are strictly prohibited from reviewing, disclosing, copying using or disseminating any of this information or taking any action in reliance on or regarding this information. If you have received this fax in error, please notify us immediately by telephone so that we can arrange for its return to [place]. Phone: [phone], Toll-Free: [phone], Fax: [phone] Page: 2 of 2 Call Id: [id number] Care Advice Given Per Guideline HOME CARE: You should be able to treat this at home. REASSURANCE AND EDUCATION: New rashes that are in one small (localized) area are usually due to skin contact with an irritating substance. AVOID THE CAUSE: [employer] THE AREA: Wash the area once thoroughly with soap and water to remove any remaining irritants. Thereafter avoid soaps in this area. Cleanse the area if needed with warm water. HYDROCORTISONE CREAM FOR ITCHING: * You can use hydrocortisone for  very itchy spots. * Put 1% hydrocortisone cream on the itchy area(s) 3 times a day.  Use it for a couple days, until it feels better. This will help decrease the itching. * This is an over-the-counter (OTC) drug. You can buy it at the drugstore. * Some people like to keep the cream in the refridgerator. It feels even better if the cream is used when it is cold. * CAUTION: Do not use hydrocortisone cream for more than 1 week without talking to your doctor. AVOID SCRATCHING: Try not to scratch. Cut your fingernails short. EXPECTED COURSE: Most of these rashes pass in 2 to 3 days. * Rash spreads or becomes worse CALL BACK IF: * Rash lasts over 1 week * You become worse. CARE ADVICE given per Rash - Localized and Cause Unknown (Adult) guideline. Referrals REFERRED TO PCP OFFICE

## 2017-02-25 NOTE — Telephone Encounter (Signed)
Per DPR, left detail message for patient to call back.  

## 2017-02-25 NOTE — Telephone Encounter (Signed)
Please notify patient to stop glimepiride if the insurance is no longer covering. We can switch to glipizide 10 mg, take 1 tablet by mouth BID. Have her keep track of her blood sugar readings (fasting in the morning, and 2 hours after dinner) and call us with readings in 4 weeks. Continue Metformin. We will see her in January as scheduled.

## 2017-02-25 NOTE — Telephone Encounter (Signed)
Sorry, I did speak to patient on Friday. She was schedule for Tuesday morning but she much gave cancel this morning.  Patient is aware that she has been prescribed glimepiride and insurance will not cover.  Left another message for patient regarding this and an update of the rash.

## 2017-02-26 ENCOUNTER — Ambulatory Visit: Payer: Medicare Other | Admitting: Primary Care

## 2017-02-26 NOTE — Telephone Encounter (Signed)
Spoken to patient and stated that rash is getting worse. Schedule office visit on 02/27/2017 with Anda Kraft.

## 2017-02-26 NOTE — Telephone Encounter (Signed)
Noted, will discuss rash and diabetes during her upcoming appointment.

## 2017-02-26 NOTE — Telephone Encounter (Signed)
Spoken to patient earlier today of Kate's comments. Patient is agreeable to glipizide.   However, patient is coming in [facility] 02/27/2017 regarding rash.

## 2017-02-27 ENCOUNTER — Encounter: Payer: Self-pay | Admitting: Primary Care

## 2017-02-27 ENCOUNTER — Ambulatory Visit (INDEPENDENT_AMBULATORY_CARE_PROVIDER_SITE_OTHER): Payer: Medicare Other | Admitting: Primary Care

## 2017-02-27 VITALS — BP 120/64 | HR 73 | Temp 97.8°F | Ht 65.5 in | Wt 202.4 lb

## 2017-02-27 DIAGNOSIS — E1140 Type 2 diabetes mellitus with diabetic neuropathy, unspecified: Secondary | ICD-10-CM | POA: Diagnosis not present

## 2017-02-27 DIAGNOSIS — R21 Rash and other nonspecific skin eruption: Secondary | ICD-10-CM

## 2017-02-27 MED ORDER — PREDNISONE 10 MG PO TABS: ORAL_TABLET | ORAL | 0 refills | Status: DC

## 2017-02-27 NOTE — [facility] Notes (Signed)
Subjective:    Patient ID: Carly Buckley, female    DOB: [birth year], 71 y.o.   MRN: [id number]  HPI  Carly Buckley is a 70 year old female who presents today with a chief complaint of rash and follow up of diabetes.  1) Type 2 Diabetes:  Current medications include: Glimepiride 8 mg, metformin 1000 mg BID. She notified us several days ago that her glimepiride is no longer covered by her insurance, therefore, a prescription for glipizide 10 mg BID was sent to her pharmacy. Her last cortisone injection to the knee was 4 months ago.  She is not checking her blood glucose.  Last A1C: 8.5 in October 2018 Last Eye Exam: Completed in August 2018 Last Foot Exam: Completed in January 2018 Pneumonia Vaccination: UTD ACE/ARB: ACE Statin: None    2) Rash: Her rash is located to the bilateral lower extremities and right ear that has been present for the past one week. Her rash is itchy and stinging in nature. She's been applying hydrocortisone cream OTC with some improvement. She denies changes in soaps/detergents/lotions/medications. She does admit to wearing a pair of new jeans, prior to the rash development, that she hadn't laundered. No one else in her household is itching. She denies shortness of breath, cough, wheezing, throat tightness.   Review of Systems  Eyes: Negative for visual disturbance.  Respiratory: Negative for shortness of breath.   Cardiovascular: Negative for chest pain.  Skin: Positive for rash.  Neurological: Negative for dizziness and numbness.       Past Medical History:  Diagnosis Date  . Arthritis   . Colon cancer ([place])    colon ca dx 07, colon resection  . Complication of anesthesia   . Cough 07/24/12   productive, ? color, no fever, congested   . Diabetes mellitus   . Diverticulitis   . Dysrhythmia    palpitations  . Helicobacter pylori (H. pylori)   . Hypertension   . Malignant neoplasm of colon   . Neuropathy, idiopathic   . PONV (postoperative nausea  and vomiting)   . Type II diabetes mellitus without mention of complication      Social History   Socioeconomic History  . Marital status: Married    Spouse name: Not on file  . Number of children: Not on file  . Years of education: Not on file  . Highest education level: Not on file  Social Needs  . Financial resource strain: Not on file  . Food insecurity - worry: Not on file  . Food insecurity - inability: Not on file  . Transportation needs - medical: Not on file  . Transportation needs - non-medical: Not on file  Occupational History  . Not on file  Tobacco Use  . Smoking status: Former Smoker    Last attempt to quit: 06/18/1972    Years since quitting: 44.7  . Smokeless tobacco: Never Used  Substance and Sexual Activity  . Alcohol use: No  . Drug use: No  . Sexual activity: Not on file  Other Topics Concern  . Not on file  Social History Narrative   Married.   4 children, 7 grandchildren, 3 great grandchildren.   Retired. Once worked at [employer]	[place].   Enjoys walking, puzzle books, spending time with family.    Past Surgical History:  Procedure Laterality Date  . COLON RESECTION  2007   colon cancer  . TONSILLECTOMY    . TUBAL LIGATION  Family History  Problem Relation Age of Onset  . Lung cancer Mother 27  . Kidney failure Father 3  . Diabetes Sister   . Diabetes Brother   . Brain cancer Daughter     Allergies  Allergen Reactions  . Morphine And Related     Nauseated   . Oxycodone Nausea Only    Current Outpatient Medications on File Prior to Visit  Medication Sig Dispense Refill  . amLODipine (NORVASC) 10 MG tablet TAKE 1 TABLET BY MOUTH ONCE DAILY 90 tablet 1  . Calcium Carbonate-Vit D-Min (CALTRATE 600+D PLUS MINERALS) 600-800 MG-UNIT CHEW Chew 1 tablet by mouth daily.    . ciclopirox (LOPROX) 0.77 % cream Apply topically 2 (two) times daily. 30 g 0  . conjugated estrogens (PREMARIN) vaginal cream Place vaginally daily. 0.5-1  gram vaginally 42.5 g 11  . ELIQUIS 5 MG TABS tablet TAKE ONE TABLET BY MOUTH TWICE DAILY 60 tablet 5  . lisinopril-hydrochlorothiazide (PRINZIDE,ZESTORETIC) 20-25 MG tablet TAKE ONE TABLET BY MOUTH IN THE MORNING 90 tablet 3  . metFORMIN (GLUCOPHAGE) 1000 MG tablet TAKE ONE TABLET BY MOUTH TWICE DAILY WITH MEALS 180 tablet 1  . metoprolol succinate (TOPROL-XL) 100 MG 24 hr tablet Take 1 tablet (100 mg total) by mouth daily. Take with or immediately following a meal. 90 tablet 3  . ONE TOUCH ULTRA TEST test strip USE AS DIRECTED TO TEST BLOOD SUGAR 100 each 2  . Propylene Glycol (SYSTANE BALANCE) 0.6 % SOLN Place 1 drop into both eyes daily as needed (for dry eyes).    Marland Kitchen glipiZIDE (GLUCOTROL) 10 MG tablet Take 1 tablet (10 mg total) 2 (two) times daily by mouth. (Patient not taking: Reported on 02/27/2017) 180 tablet 1   No current facility-administered medications on file prior to visit.     BP 120/64   Pulse 73   Temp 97.8 F (36.6 C) (Oral)   Ht 5' 5.5" (1.664 m)   Wt 202 lb 6.4 oz (91.8 kg)   SpO2 98%   BMI 33.17 kg/m    Objective:   Physical Exam  Constitutional: She appears well-nourished.  HENT:  Right auricle with mild erythema and swelling. TM unremarkable.  Neck: Neck supple.  Cardiovascular: Normal rate and regular rhythm.  Pulmonary/Chest: Effort normal and breath sounds normal.  Skin: Skin is warm and dry. Rash noted.  Located to bilateral anterior and posterior lower extremities distal to the patella. Raised, purpura, red rash. No vesicals. No evidence of cellulitis.          Assessment & Plan:

## 2017-02-27 NOTE — Assessment & Plan Note (Signed)
Seems to be irritative and contact related. No shingles, cellulitis, wheals.  Rx for low dose steroid course sent to pharmacy.  Discussed to start Claritin daily. Continue OTC cortisone cream as this has been effective for itching. She will call in 3-4 days if no improvement.

## 2017-02-27 NOTE — Assessment & Plan Note (Signed)
A1C improved but still above goal. Will switch to glipizide as glimepiride was too costly. Continue metformin. Discussed that if A1C doesn't continue to improve with diet changes then we need to consider low dose Lantus. Repeat A1C in three months. Work on diet and exercise.

## 2017-02-27 NOTE — Patient Instructions (Signed)
Start prednisone tablets. Take three tablets for 2 days, then two tablets for 2 days, then one tablet for 2 days.  Start Claritin once daily for itching and the rash. You can get this over the counter.  Please call me in 3-4 days if no improvement in your rash.  Do not take glimepiride. Start glipizide 10 mg twice daily, everyday. Continue Metformin 1000 mg twice daily.  It is important that you improve your diet. Please limit carbohydrates in the form of white bread, rice, pasta, sweets, fast food, fried food, sugary drinks, etc. Increase your consumption of fresh fruits and vegetables, whole grains, lean protein.  Ensure you are consuming 64 ounces of water daily.  Schedule a lab only appointment in 3 months to recheck your A1C.   It was a pleasure to see you today!

## 2017-04-17 ENCOUNTER — Other Ambulatory Visit: Payer: Self-pay

## 2017-04-17 MED ORDER — APIXABAN 5 MG PO TABS: 5.0000 mg | ORAL_TABLET | Freq: Two times a day (BID) | ORAL | 5 refills | Status: DC

## 2017-04-17 NOTE — Telephone Encounter (Signed)
Pt last saw Dr Acie Fredrickson 12/14/16, last labs 08/24/16 Creat 0.80, age 72, weight 91.8kg, based on specified criteria pt is on appropriate dosage of Eliquis 5mg  BID.  Will refill rx.

## 2017-05-01 ENCOUNTER — Telehealth: Payer: Self-pay | Admitting: Primary Care

## 2017-05-01 MED ORDER — LISINOPRIL-HYDROCHLOROTHIAZIDE 20-25 MG PO TABS: 1.0000 | ORAL_TABLET | Freq: Every morning | ORAL | 1 refills | Status: DC

## 2017-05-01 NOTE — Telephone Encounter (Signed)
Patient needs Rx sent to different pharmacy- it was sent to [facility] in December and it needs to go to [facility]

## 2017-05-01 NOTE — Telephone Encounter (Signed)
Noted. Refill has been sent to [facility].

## 2017-05-01 NOTE — Telephone Encounter (Signed)
Copied from [place] #[id number]. Topic: Quick Communication - Rx Refill/Question >> May 01, 2017 10:46 AM Arletha Grippe wrote: Medication: Medication: lisinopril-hydrochlorothiazide (PRINZIDE,ZESTORETIC) 20-25 MG tablet needs 90 day supply    Has the patient contacted their pharmacy? Yes.   Pharm has been faxing - no response , pt says they have tried faxing since November   (Agent: If no, request that the patient contact the pharmacy for the refill.)   Preferred Pharmacy (with phone number or street name): [facility]  Pt runs out 05/06/17   Agent: Please be advised that RX refills may take up to 3 business days. We ask that you follow-up with your pharmacy.

## 2017-05-03 ENCOUNTER — Other Ambulatory Visit: Payer: Self-pay | Admitting: Primary Care

## 2017-05-03 DIAGNOSIS — E1140 Type 2 diabetes mellitus with diabetic neuropathy, unspecified: Secondary | ICD-10-CM

## 2017-05-03 DIAGNOSIS — I10 Essential (primary) hypertension: Secondary | ICD-10-CM

## 2017-05-03 DIAGNOSIS — Z1159 Encounter for screening for other viral diseases: Secondary | ICD-10-CM

## 2017-05-08 NOTE — [facility] Notes (Signed)
Addendum created to add Hep C to lab orders.

## 2017-05-09 ENCOUNTER — Ambulatory Visit: Payer: Medicare Other

## 2017-05-16 ENCOUNTER — Encounter: Payer: Medicare Other | Admitting: Primary Care

## 2017-05-17 ENCOUNTER — Other Ambulatory Visit: Payer: Self-pay | Admitting: Primary Care

## 2017-05-17 DIAGNOSIS — I10 Essential (primary) hypertension: Secondary | ICD-10-CM

## 2017-05-17 MED ORDER — AMLODIPINE BESYLATE 10 MG PO TABS: 10.0000 mg | ORAL_TABLET | Freq: Every day | ORAL | 1 refills | Status: DC

## 2017-05-31 ENCOUNTER — Other Ambulatory Visit: Payer: Medicare Other

## 2017-05-31 ENCOUNTER — Ambulatory Visit (INDEPENDENT_AMBULATORY_CARE_PROVIDER_SITE_OTHER): Payer: Medicare Other

## 2017-05-31 VITALS — BP 140/70 | HR 109 | Temp 97.8°F | Ht 65.25 in | Wt 196.5 lb

## 2017-05-31 DIAGNOSIS — Z1159 Encounter for screening for other viral diseases: Secondary | ICD-10-CM | POA: Diagnosis not present

## 2017-05-31 DIAGNOSIS — E1140 Type 2 diabetes mellitus with diabetic neuropathy, unspecified: Secondary | ICD-10-CM | POA: Diagnosis not present

## 2017-05-31 DIAGNOSIS — Z0000 Encounter for general adult medical examination without abnormal findings: Secondary | ICD-10-CM

## 2017-05-31 DIAGNOSIS — I10 Essential (primary) hypertension: Secondary | ICD-10-CM | POA: Diagnosis not present

## 2017-05-31 LAB — LIPID PANEL
CHOL/HDL RATIO: 3
CHOLESTEROL: 173 mg/dL (ref 0–200)
HDL: 52.7 mg/dL (ref >39.00)
LDL CALC: 105 mg/dL — AB (ref 0–99)
NONHDL: 120.17
Triglycerides: 77 mg/dL (ref 0.0–149.0)
VLDL: 15.4 mg/dL (ref 0.0–40.0)

## 2017-05-31 LAB — COMPREHENSIVE METABOLIC PANEL
ALT: 23 U/L (ref 0–35)
AST: 15 U/L (ref 0–37)
Albumin: 4.5 g/dL (ref 3.5–5.2)
Alkaline Phosphatase: 67 U/L (ref 39–117)
BUN: 16 mg/dL (ref 6–23)
CHLORIDE: 98 meq/L (ref 96–112)
CO2: 27 meq/L (ref 19–32)
Calcium: 9.5 mg/dL (ref 8.4–10.5)
Creatinine, Ser: 0.69 mg/dL (ref 0.40–1.20)
GFR: 89.02 mL/min (ref >60.00)
GLUCOSE: 183 mg/dL — AB (ref 70–99)
Potassium: 3.9 mEq/L (ref 3.5–5.1)
SODIUM: 135 meq/L (ref 135–145)
Total Bilirubin: 0.5 mg/dL (ref 0.2–1.2)
Total Protein: 7.5 g/dL (ref 6.0–8.3)

## 2017-05-31 LAB — HEMOGLOBIN A1C: Hgb A1c MFr Bld: 8.3 % — ABNORMAL HIGH (ref 4.6–6.5)

## 2017-05-31 NOTE — Patient Instructions (Addendum)
Ms. Mealor , Thank you for taking time to come for your Medicare Wellness Visit. I appreciate your ongoing commitment to your health goals. Please review the following plan we discussed and let me know if I can assist you in the future.   These are the goals we discussed: Goals    . Follow up with Primary Care Provider     Starting 05/31/2017, I will continue to take medications as prescribed and to keep appointments as prescribed.        This is a list of the screening recommended for you and due dates:  Health Maintenance  Topic Date Due  . Complete foot exam   06/07/2017*  . Flu Shot  01/31/2018*  . Tetanus Vaccine  05/31/2018*  . Mammogram  08/07/2017  . Eye exam for diabetics  11/22/2017  . Hemoglobin A1C  11/28/2017  . Colon Cancer Screening  07/11/2023  . DEXA scan (bone density measurement)  Completed  .  Hepatitis C: One time screening is recommended by Center for Disease Control  (CDC) for  adults born from [birth year] through 1965.   Completed  . Pneumonia vaccines  Completed  *Topic was postponed. The date shown is not the original due date.   Preventive Care for Adults  A healthy lifestyle and preventive care can promote health and wellness. Preventive health guidelines for adults include the following key practices.  . A routine yearly physical is a good way to check with your health care provider about your health and preventive screening. It is a chance to share any concerns and updates on your health and to receive a thorough exam.  . Visit your dentist for a routine exam and preventive care every 6 months. Brush your teeth twice a day and floss once a day. Good oral hygiene prevents tooth decay and gum disease.  . The frequency of eye exams is based on your age, health, family medical history, use  of contact lenses, and other factors. Follow your health care provider's recommendations for frequency of eye exams.  . Eat a healthy diet. Foods like vegetables, fruits,  whole grains, low-fat dairy products, and lean protein foods contain the nutrients you need without too many calories. Decrease your intake of foods high in solid fats, added sugars, and salt. Eat the right amount of calories for you. Get information about a proper diet from your health care provider, if necessary.  . Regular physical exercise is one of the most important things you can do for your health. Most adults should get at least 150 minutes of moderate-intensity exercise (any activity that increases your heart rate and causes you to sweat) each week. In addition, most adults need muscle-strengthening exercises on 2 or more days a week.  Silver Sneakers may be a benefit available to you. To determine eligibility, you may visit the website: www.silversneakers.com or contact program at 530-817-7394 Mon-Fri between 8AM-8PM.   . Maintain a healthy weight. The body mass index (BMI) is a screening tool to identify possible weight problems. It provides an estimate of body fat based on height and weight. Your health care provider can find your BMI and can help you achieve or maintain a healthy weight.   For adults 20 years and older: ? A BMI below 18.5 is considered underweight. ? A BMI of 18.5 to 24.9 is normal. ? A BMI of 25 to 29.9 is considered overweight. ? A BMI of 30 and above is considered obese.   . Maintain normal blood  lipids and cholesterol levels by exercising and minimizing your intake of saturated fat. Eat a balanced diet with plenty of fruit and vegetables. Blood tests for lipids and cholesterol should begin at age 70 and be repeated every 5 years. If your lipid or cholesterol levels are high, you are over 50, or you are at high risk for heart disease, you may need your cholesterol levels checked more frequently. Ongoing high lipid and cholesterol levels should be treated with medicines if diet and exercise are not working.  . If you smoke, find out from your health care provider  how to quit. If you do not use tobacco, please do not start.  . If you choose to drink alcohol, please do not consume more than 2 drinks per day. One drink is considered to be 12 ounces (355 mL) of beer, 5 ounces (148 mL) of wine, or 1.5 ounces (44 mL) of liquor.  . If you are 55-63 years old, ask your health care provider if you should take aspirin to prevent strokes.  . Use sunscreen. Apply sunscreen liberally and repeatedly throughout the day. You should seek shade when your shadow is shorter than you. Protect yourself by wearing long sleeves, pants, a wide-brimmed hat, and sunglasses year round, whenever you are outdoors.  . Once a month, do a whole body skin exam, using a mirror to look at the skin on your back. Tell your health care provider of new moles, moles that have irregular borders, moles that are larger than a pencil eraser, or moles that have changed in shape or color.

## 2017-05-31 NOTE — [facility] Notes (Signed)
PCP notes:   Health maintenance:  Foot exam - PCP please address at next appt Bone density - PCP please referral; pt desires to use [facility] in [place], [place] Flu vaccine - pt declined Tetanus vaccine - postponed/insurance A1C - completed Hep C screening - completed  Abnormal screenings:   Mini-Cog score: 18/20 MMSE - Mini Mental State Exam 05/31/2017  Orientation to time 5  Orientation to Place 5  Registration 3  Attention/ Calculation 0  Recall 3  Language- name 2 objects 0  Language- repeat 1  Language- follow 3 step command 1  Language- follow 3 step command-comments unable to follow 1 step of 3 step command; 3 attempts to complete clock  Language- read & follow direction 0  Write a sentence 0  Copy design 0  Total score 18    Patient concerns:   None  Nurse concerns:  None  Next PCP appt:   06/07/17 @ 1015

## 2017-05-31 NOTE — Progress Notes (Signed)
Pre visit review using our clinic review tool, if applicable. No additional management support is needed unless otherwise documented below in the visit note. 

## 2017-05-31 NOTE — [facility] Notes (Signed)
I reviewed health advisor's note, was available for consultation, and agree with documentation and plan.  

## 2017-05-31 NOTE — [facility] Notes (Signed)
Subjective:   Carly Buckley is a 72 y.o. female who presents for Medicare Annual (Subsequent) preventive examination.  Review of Systems:  N/A Cardiac Risk Factors include: advanced age (>22men, >57 women);obesity (BMI >30kg/m2);diabetes mellitus;hypertension     Objective:     Vitals: BP 140/70 (BP Location: Right Arm, Patient Position: Sitting, Cuff Size: Normal)   Pulse (!) 109   Temp 97.8 F (36.6 C) (Oral)   Ht 5' 5.25" (1.657 m) Comment: no shoes  Wt 196 lb 8 oz (89.1 kg)   SpO2 98%   BMI 32.45 kg/m   Body mass index is 32.45 kg/m.  Advanced Directives 05/31/2017 08/04/2012 08/04/2012 07/28/2012 06/18/2012  Does Patient Have a Medical Advance Directive? Yes Patient has advance directive, copy not in chart Patient has advance directive, copy not in chart Patient has advance directive, copy not in chart Patient has advance directive, copy not in chart  Type of Advance Directive [place];Living will Living will Living will (No Data) -  Copy of [place] in Chart? No - copy requested Copy requested from family Copy requested from family Copy requested from family Copy requested from family  Pre-existing out of facility DNR order (yellow form or pink MOST form) - No No - -    Tobacco Social History   Tobacco Use  Smoking Status Former Smoker  . Last attempt to quit: 06/18/1972  . Years since quitting: 44.9  Smokeless Tobacco Never Used     Counseling given: No   Clinical Intake:  Pre-visit preparation completed: Yes  Pain : No/denies pain Pain Score: 0-No pain     Nutritional Status: BMI > 30  Obese Nutritional Risks: None Diabetes: Yes CBG done?: No Did pt. bring in CBG monitor from home?: No  How often do you need to have someone help you when you read instructions, pamphlets, or other written materials from your doctor or pharmacy?: 1 - Never What is the last grade level you completed in school?: 8th grade  Interpreter  Needed?: No  Comments: pt lives with spouse Information entered by :: LPinson, LPN  Past Medical History:  Diagnosis Date  . Arthritis   . Colon cancer ([place])    colon ca dx 07, colon resection  . Complication of anesthesia   . Cough 07/24/12   productive, ? color, no fever, congested   . Diabetes mellitus   . Diverticulitis   . Dysrhythmia    palpitations  . Helicobacter pylori (H. pylori)   . Hypertension   . Malignant neoplasm of colon   . Neuropathy, idiopathic   . PONV (postoperative nausea and vomiting)   . Type II diabetes mellitus without mention of complication    Past Surgical History:  Procedure Laterality Date  . COLON RESECTION  2007   colon cancer  . STERIOD INJECTION Left 08/04/2012   Procedure: STEROID INJECTION;  Surgeon: Gearlean Alf, MD;  Location: [facility];  Service: Orthopedics;  Laterality: Left;  . TONSILLECTOMY    . TOTAL KNEE ARTHROPLASTY Right 08/04/2012   Procedure: TOTAL KNEE ARTHROPLASTY;  Surgeon: Gearlean Alf, MD;  Location: [facility];  Service: Orthopedics;  Laterality: Right;  . TUBAL LIGATION     Family History  Problem Relation Age of Onset  . Lung cancer Mother 13  . Kidney failure Father 103  . Diabetes Sister   . Diabetes Brother   . Brain cancer Daughter    Social History   Socioeconomic History  .  Marital status: Married    Spouse name: None  . Number of children: None  . Years of education: None  . Highest education level: None  Social Needs  . Financial resource strain: None  . Food insecurity - worry: None  . Food insecurity - inability: None  . Transportation needs - medical: None  . Transportation needs - non-medical: None  Occupational History  . None  Tobacco Use  . Smoking status: Former Smoker    Last attempt to quit: 06/18/1972    Years since quitting: 44.9  . Smokeless tobacco: Never Used  Substance and Sexual Activity  . Alcohol use: No  . Drug use: No  . Sexual activity: Not Currently  Other Topics  Concern  . None  Social History Narrative   Married.   4 children, 7 grandchildren, 3 great grandchildren.   Retired. Once worked at [employer]	[place].   Enjoys walking, puzzle books, spending time with family.    Outpatient Encounter Medications as of 05/31/2017  Medication Sig  . amLODipine (NORVASC) 10 MG tablet Take 1 tablet (10 mg total) by mouth daily.  Marland Kitchen apixaban (ELIQUIS) 5 MG TABS tablet Take 1 tablet (5 mg total) by mouth 2 (two) times daily.  . Calcium Carb-Cholecalciferol (CALCIUM+D3 PO) Take 1 tablet by mouth daily.  . ciclopirox (LOPROX) 0.77 % cream Apply topically 2 (two) times daily.  Marland Kitchen conjugated estrogens (PREMARIN) vaginal cream Place vaginally daily. 0.5-1 gram vaginally  . glipiZIDE (GLUCOTROL) 10 MG tablet Take 1 tablet (10 mg total) 2 (two) times daily by mouth.  Marland Kitchen lisinopril-hydrochlorothiazide (PRINZIDE,ZESTORETIC) 20-25 MG tablet Take 1 tablet by mouth every morning.  . metFORMIN (GLUCOPHAGE) 1000 MG tablet TAKE ONE TABLET BY MOUTH TWICE DAILY WITH MEALS  . ONE TOUCH ULTRA TEST test strip USE AS DIRECTED TO TEST BLOOD SUGAR  . Propylene Glycol (SYSTANE BALANCE) 0.6 % SOLN Place 1 drop into both eyes daily as needed (for dry eyes).  . [DISCONTINUED] Calcium Carbonate-Vit D-Min (CALTRATE 600+D PLUS MINERALS) 600-800 MG-UNIT CHEW Chew 1 tablet by mouth daily.  . [DISCONTINUED] predniSONE (DELTASONE) 10 MG tablet Take three tablets for 2 days, then two tablets for 2 days, then one tablet for 2 days.  . metoprolol succinate (TOPROL-XL) 100 MG 24 hr tablet Take 1 tablet (100 mg total) by mouth daily. Take with or immediately following a meal.   No facility-administered encounter medications on file as of 05/31/2017.     Activities of Daily Living In your present state of health, do you have any difficulty performing the following activities: 05/31/2017  Hearing? N  Vision? N  Difficulty concentrating or making decisions? N  Walking or climbing stairs? N  Dressing  or bathing? N  Doing errands, shopping? N  Preparing Food and eating ? N  Using the Toilet? N  In the past six months, have you accidently leaked urine? N  Do you have problems with loss of bowel control? N  Managing your Medications? N  Managing your Finances? N  Housekeeping or managing your Housekeeping? N  Some recent data might be hidden    Patient Care Team: Pleas Koch, NP as PCP - General (Internal Medicine) Shirley Muscat Loreen Freud, MD as Referring Physician (Optometry)    Assessment:   This is a routine wellness examination for Libertie.   Hearing Screening   125Hz  250Hz  500Hz  1000Hz  2000Hz  3000Hz  4000Hz  6000Hz  8000Hz   Right ear:   40 40 40  40    Left ear:  40 40 40  40    Vision Screening Comments: Last vision exam in August 2018    Exercise Activities and Dietary recommendations Current Exercise Habits: Home exercise routine, Type of exercise: walking, Time (Minutes): 60, Frequency (Times/Week): 7, Weekly Exercise (Minutes/Week): 420, Intensity: Mild, Exercise limited by: None identified  Goals    . Follow up with Primary Care Provider     Starting 05/31/2017, I will continue to take medications as prescribed and to keep appointments as prescribed.        Fall Risk Fall Risk  05/31/2017 05/04/2015 04/21/2014 04/27/2013  Falls in the past year? No No No No   Depression Screen PHQ 2/9 Scores 05/31/2017 05/08/2016 05/04/2015 04/21/2014  PHQ - 2 Score 0 0 0 0  PHQ- 9 Score 0 - - -     Cognitive Function MMSE - Mini Mental State Exam 05/31/2017  Orientation to time 5  Orientation to Place 5  Registration 3  Attention/ Calculation 0  Recall 3  Language- name 2 objects 0  Language- repeat 1  Language- follow 3 step command 1  Language- follow 3 step command-comments unable to follow 1 step of 3 step command; 3 attempts to complete clock  Language- read & follow direction 0  Write a sentence 0  Copy design 0  Total score 18       PLEASE NOTE: A Mini-Cog screen  was completed. Maximum score is 20. A value of 0 denotes this part of [place] MMSE was not completed or the patient failed this part of the Mini-Cog screening.   Mini-Cog Screening Orientation to Time - Max 5 pts Orientation to Place - Max 5 pts Registration - Max 3 pts Recall - Max 3 pts Language Repeat - Max 1 pts Language Follow 3 Step Command - Max 3 pts   Immunization History  Administered Date(s) Administered  . Influenza Split 03/29/2009  . Pneumococcal Conjugate-13 11/30/2014  . Pneumococcal Polysaccharide-23 05/08/2016  . Td 04/16/2006    Screening Tests Health Maintenance  Topic Date Due  . FOOT EXAM  06/07/2017 (Originally 05/08/2017)  . INFLUENZA VACCINE  01/31/2018 (Originally 11/14/2016)  . TETANUS/TDAP  05/31/2018 (Originally 04/16/2016)  . MAMMOGRAM  08/07/2017  . OPHTHALMOLOGY EXAM  11/22/2017  . HEMOGLOBIN A1C  11/28/2017  . COLONOSCOPY  07/11/2023  . DEXA SCAN  Completed  . Hepatitis C Screening  Completed  . PNA vac Low Risk Adult  Completed      Plan:     I have personally reviewed, addressed, and noted the following in the patient's chart:  A. Medical and social history B. Use of alcohol, tobacco or illicit drugs  C. Current medications and supplements D. Functional ability and status E.  Nutritional status F.  Physical activity G. Advance directives H. List of other physicians I.  Hospitalizations, surgeries, and ER visits in previous 12 months J.  [place] to include hearing, vision, cognitive, depression L. Referrals and appointments - none  In addition, I have reviewed and discussed with patient certain preventive protocols, quality metrics, and best practice recommendations. A written personalized care plan for preventive services as well as general preventive health recommendations were provided to patient.  See attached scanned questionnaire for additional information.   Signed,   Lindell Noe, MHA, BS, LPN [facility]   Lindell Noe, [place]  [id number]

## 2017-06-01 LAB — HEPATITIS C ANTIBODY
HEP C AB: NONREACTIVE
SIGNAL TO CUT-OFF: 0.73 (ref <1.00)

## 2017-06-07 ENCOUNTER — Encounter: Payer: Self-pay | Admitting: Primary Care

## 2017-06-07 ENCOUNTER — Ambulatory Visit (INDEPENDENT_AMBULATORY_CARE_PROVIDER_SITE_OTHER): Payer: Medicare Other | Admitting: Primary Care

## 2017-06-07 VITALS — BP 136/72 | HR 74 | Temp 97.8°F | Ht 65.25 in | Wt 196.0 lb

## 2017-06-07 DIAGNOSIS — I480 Paroxysmal atrial fibrillation: Secondary | ICD-10-CM | POA: Diagnosis not present

## 2017-06-07 DIAGNOSIS — Z1239 Encounter for other screening for malignant neoplasm of breast: Secondary | ICD-10-CM

## 2017-06-07 DIAGNOSIS — I10 Essential (primary) hypertension: Secondary | ICD-10-CM

## 2017-06-07 DIAGNOSIS — E1140 Type 2 diabetes mellitus with diabetic neuropathy, unspecified: Secondary | ICD-10-CM

## 2017-06-07 DIAGNOSIS — E2839 Other primary ovarian failure: Secondary | ICD-10-CM

## 2017-06-07 DIAGNOSIS — R21 Rash and other nonspecific skin eruption: Secondary | ICD-10-CM

## 2017-06-07 DIAGNOSIS — Z0000 Encounter for general adult medical examination without abnormal findings: Secondary | ICD-10-CM | POA: Diagnosis not present

## 2017-06-07 DIAGNOSIS — Z1231 Encounter for screening mammogram for malignant neoplasm of breast: Secondary | ICD-10-CM | POA: Diagnosis not present

## 2017-06-07 DIAGNOSIS — E785 Hyperlipidemia, unspecified: Secondary | ICD-10-CM | POA: Diagnosis not present

## 2017-06-07 DIAGNOSIS — C189 Malignant neoplasm of colon, unspecified: Secondary | ICD-10-CM | POA: Diagnosis not present

## 2017-06-07 MED ORDER — METFORMIN HCL ER 500 MG PO TB24: 1000.0000 mg | ORAL_TABLET | Freq: Two times a day (BID) | ORAL | 1 refills | Status: DC

## 2017-06-07 NOTE — Assessment & Plan Note (Addendum)
Using OTC antifungal cream intermittently with improvement during flares.

## 2017-06-07 NOTE — Progress Notes (Signed)
Subjective:    Patient ID: Carly Buckley, female    DOB: [birth year], 72 y.o.   MRN: [id number]  HPI  Carly Buckley is a 72 year old female who presents today for complete physical.  She is experiencing diarrhea after most meals, this has been present since her colon cancer surgery in 2007. She denies bloody stools, abdominal pain, unexplained weight loss. Her last colonoscopy was in 2015, due in 2020. She is on Metformin 1000 mg BID for diabetes. Sometimes will not experience diarrhea.   Immunizations: -Tetanus: Completed in 2008 -Influenza: Declines -Pneumonia: Completed Prevnar in 2016, Pneumovax in 2018 -Shingles: Declines   Diet: She endorses fair diet. Breakfast: Oatmeal, eggs, grits Lunch: Grilled chicken, salad, sandwich Dinner: Soup, baked chicken, vegetables, frozen dinner Snacks: Occasionally, crackers, fruit Desserts: Occsaionally Beverages: Water with sugar free flavor, diet green tea, coffee  Exercise: She is not currently exercising.  Eye exam: Completed in August 2018 Dental exam: No recent exam Colonoscopy: Completed in 2015, due in 2020 Dexa: Completed in April 2017, osteopenia  Mammogram: Completed in April 2018 Hep C Screen: Negative in 2019   Review of Systems  Constitutional: Negative for unexpected weight change.  HENT: Negative for rhinorrhea.   Respiratory: Negative for cough and shortness of breath.   Cardiovascular: Negative for chest pain.  Gastrointestinal: Positive for diarrhea. Negative for constipation.       Diarrhea occurs 0-3 times daily after meals since colon cancer surgery in 2007. Denies bloody stools, unexplained weight loss.   Genitourinary: Negative for difficulty urinating and menstrual problem.  Musculoskeletal: Negative for arthralgias and myalgias.  Skin: Negative for rash.  Allergic/Immunologic: Negative for environmental allergies.  Neurological: Negative for dizziness, numbness and headaches.       Past Medical History:    Diagnosis Date  . Arthritis   . Colon cancer ([place])    colon ca dx 07, colon resection  . Complication of anesthesia   . Cough 07/24/12   productive, ? color, no fever, congested   . Diabetes mellitus   . Diverticulitis   . Dysrhythmia    palpitations  . Helicobacter pylori (H. pylori)   . Hypertension   . Malignant neoplasm of colon   . Neuropathy, idiopathic   . PONV (postoperative nausea and vomiting)   . Type II diabetes mellitus without mention of complication      Social History   Socioeconomic History  . Marital status: Married    Spouse name: Not on file  . Number of children: Not on file  . Years of education: Not on file  . Highest education level: Not on file  Social Needs  . Financial resource strain: Not on file  . Food insecurity - worry: Not on file  . Food insecurity - inability: Not on file  . Transportation needs - medical: Not on file  . Transportation needs - non-medical: Not on file  Occupational History  . Not on file  Tobacco Use  . Smoking status: Former Smoker    Last attempt to quit: 06/18/1972    Years since quitting: 45.0  . Smokeless tobacco: Never Used  Substance and Sexual Activity  . Alcohol use: No  . Drug use: No  . Sexual activity: Not Currently  Other Topics Concern  . Not on file  Social History Narrative   Married.   4 children, 7 grandchildren, 3 great grandchildren.   Retired. Once worked at [employer]	[place].   Enjoys walking, puzzle books, spending time with  family.    Past Surgical History:  Procedure Laterality Date  . COLON RESECTION  2007   colon cancer  . STERIOD INJECTION Left 08/04/2012   Procedure: STEROID INJECTION;  Surgeon: Gearlean Alf, MD;  Location: [facility];  Service: Orthopedics;  Laterality: Left;  . TONSILLECTOMY    . TOTAL KNEE ARTHROPLASTY Right 08/04/2012   Procedure: TOTAL KNEE ARTHROPLASTY;  Surgeon: Gearlean Alf, MD;  Location: [facility];  Service: Orthopedics;  Laterality: Right;  . TUBAL  LIGATION      Family History  Problem Relation Age of Onset  . Lung cancer Mother 26  . Kidney failure Father 18  . Diabetes Sister   . Diabetes Brother   . Brain cancer Daughter     Allergies  Allergen Reactions  . Morphine And Related     Nauseated   . Oxycodone Nausea Only    Current Outpatient Medications on File Prior to Visit  Medication Sig Dispense Refill  . amLODipine (NORVASC) 10 MG tablet Take 1 tablet (10 mg total) by mouth daily. 90 tablet 1  . apixaban (ELIQUIS) 5 MG TABS tablet Take 1 tablet (5 mg total) by mouth 2 (two) times daily. 60 tablet 5  . Calcium Carb-Cholecalciferol (CALCIUM+D3 PO) Take 1 tablet by mouth daily.    . ciclopirox (LOPROX) 0.77 % cream Apply topically 2 (two) times daily. 30 g 0  . glipiZIDE (GLUCOTROL) 10 MG tablet Take 1 tablet (10 mg total) 2 (two) times daily by mouth. 180 tablet 1  . lisinopril-hydrochlorothiazide (PRINZIDE,ZESTORETIC) 20-25 MG tablet Take 1 tablet by mouth every morning. 90 tablet 1  . metFORMIN (GLUCOPHAGE) 1000 MG tablet TAKE ONE TABLET BY MOUTH TWICE DAILY WITH MEALS 180 tablet 1  . ONE TOUCH ULTRA TEST test strip USE AS DIRECTED TO TEST BLOOD SUGAR 100 each 2  . Propylene Glycol (SYSTANE BALANCE) 0.6 % SOLN Place 1 drop into both eyes daily as needed (for dry eyes).    . metoprolol succinate (TOPROL-XL) 100 MG 24 hr tablet Take 1 tablet (100 mg total) by mouth daily. Take with or immediately following a meal. 90 tablet 3   No current facility-administered medications on file prior to visit.     BP 136/72   Pulse 74   Temp 97.8 F (36.6 C) (Oral)   Ht 5' 5.25" (1.657 m)   Wt 196 lb (88.9 kg)   SpO2 98%   BMI 32.37 kg/m    Objective:   Physical Exam  Constitutional: She is oriented to person, place, and time. She appears well-nourished.  HENT:  Right Ear: Tympanic membrane and ear canal normal.  Left Ear: Tympanic membrane and ear canal normal.  Nose: Nose normal.  Mouth/Throat: Oropharynx is clear  and moist.  Eyes: Conjunctivae and EOM are normal. Pupils are equal, round, and reactive to light.  Neck: Neck supple. No thyromegaly present.  Cardiovascular: Normal rate and regular rhythm.  No murmur heard. Pulmonary/Chest: Effort normal and breath sounds normal. She has no rales.  Abdominal: Soft. Bowel sounds are normal. There is no tenderness.  Musculoskeletal: Normal range of motion.  Lymphadenopathy:    She has no cervical adenopathy.  Neurological: She is alert and oriented to person, place, and time. She has normal reflexes. No cranial nerve deficit.  Skin: Skin is warm and dry. No rash noted.  Psychiatric: She has a normal mood and affect.          Assessment & Plan:

## 2017-06-07 NOTE — Assessment & Plan Note (Signed)
A1C improving, down to 8.3. Discussed that this is still above goal and recommended she add in [place] for which she kindly refuses. She'd like to work on her diet and start exercising again.   Will change Metformin to extended release to help reduce diarrhea. Continue Glipizide. Managed on ACE, no statin. Will repeat A1C in three months, if no improvement then will need to add in medication. She verbalized understanding.

## 2017-06-07 NOTE — Assessment & Plan Note (Signed)
LDL above goal at 105. ASCVD risk score of 27.3%. Discussed her risk for heart disease/stroke. She declines statin treatment despite recommendations. Repeat lipids in three months.   The 10-year ASCVD risk score Mikey Bussing DC Brooke Bonito., et al., 2013) is: 27.3%   Values used to calculate the score:     Age: 72 years     Sex: Female     Is Non-Hispanic African American: No     Diabetic: Yes     Tobacco smoker: No     Systolic Blood Pressure: 342 mmHg     Is BP treated: Yes     HDL Cholesterol: 52.7 mg/dL     Total Cholesterol: 173 mg/dL

## 2017-06-07 NOTE — Patient Instructions (Signed)
We've changed your metformin. Start metformin ER 500 mg tablets. Take 2 tablets by mouth twice daily with food. Continue glipizide.  It is important that you improve your diet. Please limit carbohydrates in the form of white bread, rice, pasta, sweets, fast food, fried food, sugary drinks, etc. Increase your consumption of fresh fruits and vegetables, whole grains, lean protein.  Ensure you are consuming 64 ounces of water daily.  Start exercising. You should be getting 150 minutes of moderate intensity exercise weekly.  Call the [facility] in April to have your mammogram and bone density tests.   Schedule a lab only appointment in 3 months, make sure you are fasting 8 hours prior, you may have water and black coffee.   Please schedule a follow up appointment in 6 months.   It was a pleasure to see you today!   Diabetes Mellitus and Nutrition When you have diabetes (diabetes mellitus), it is very important to have healthy eating habits because your blood sugar (glucose) levels are greatly affected by what you eat and drink. Eating healthy foods in the appropriate amounts, at about the same times every day, can help you:  Control your blood glucose.  Lower your risk of heart disease.  Improve your blood pressure.  Reach or maintain a healthy weight.  Every person with diabetes is different, and each person has different needs for a meal plan. Your health care provider may recommend that you work with a diet and nutrition specialist (dietitian) to make a meal plan that is best for you. Your meal plan may vary depending on factors such as:  The calories you need.  The medicines you take.  Your weight.  Your blood glucose, blood pressure, and cholesterol levels.  Your activity level.  Other health conditions you have, such as heart or kidney disease.  How do carbohydrates affect me? Carbohydrates affect your blood glucose level more than any other type of food.  Eating carbohydrates naturally increases the amount of glucose in your blood. Carbohydrate counting is a method for keeping track of how many carbohydrates you eat. Counting carbohydrates is important to keep your blood glucose at a healthy level, especially if you use insulin or take certain oral diabetes medicines. It is important to know how many carbohydrates you can safely have in each meal. This is different for every person. Your dietitian can help you calculate how many carbohydrates you should have at each meal and for snack. Foods that contain carbohydrates include:  Bread, cereal, rice, pasta, and crackers.  Potatoes and corn.  Peas, beans, and lentils.  Milk and yogurt.  Fruit and juice.  Desserts, such as cakes, cookies, ice cream, and candy.  How does alcohol affect me? Alcohol can cause a sudden decrease in blood glucose (hypoglycemia), especially if you use insulin or take certain oral diabetes medicines. Hypoglycemia can be a life-threatening condition. Symptoms of hypoglycemia (sleepiness, dizziness, and confusion) are similar to symptoms of having too much alcohol. If your health care provider says that alcohol is safe for you, follow these guidelines:  Limit alcohol intake to no more than 1 drink per day for nonpregnant women and 2 drinks per day for men. One drink equals 12 oz of beer, 5 oz of wine, or 1 oz of hard liquor.  Do not drink on an empty stomach.  Keep yourself hydrated with water, diet soda, or unsweetened iced tea.  Keep in mind that regular soda, juice, and other mixers may contain a lot  of sugar and must be counted as carbohydrates.  What are tips for following this plan? Reading food labels  Start by checking the serving size on the label. The amount of calories, carbohydrates, fats, and other nutrients listed on the label are based on one serving of the food. Many foods contain more than one serving per package.  Check the total grams (g) of  carbohydrates in one serving. You can calculate the number of servings of carbohydrates in one serving by dividing the total carbohydrates by 15. For example, if a food has 30 g of total carbohydrates, it would be equal to 2 servings of carbohydrates.  Check the number of grams (g) of saturated and trans fats in one serving. Choose foods that have low or no amount of these fats.  Check the number of milligrams (mg) of sodium in one serving. Most people should limit total sodium intake to less than 2,300 mg per day.  Always check the nutrition information of foods labeled as "low-fat" or "nonfat". These foods may be higher in added sugar or refined carbohydrates and should be avoided.  Talk to your dietitian to identify your daily goals for nutrients listed on the label. Shopping  Avoid buying canned, premade, or processed foods. These foods tend to be high in fat, sodium, and added sugar.  Shop around the outside edge of the grocery store. This includes fresh fruits and vegetables, bulk grains, fresh meats, and fresh dairy. Cooking  Use low-heat cooking methods, such as baking, instead of high-heat cooking methods like deep frying.  Cook using healthy oils, such as olive, canola, or sunflower oil.  Avoid cooking with butter, cream, or high-fat meats. Meal planning  Eat meals and snacks regularly, preferably at the same times every day. Avoid going long periods of time without eating.  Eat foods high in fiber, such as fresh fruits, vegetables, beans, and whole grains. Talk to your dietitian about how many servings of carbohydrates you can eat at each meal.  Eat 4-6 ounces of lean protein each day, such as lean meat, chicken, fish, eggs, or tofu. 1 ounce is equal to 1 ounce of meat, chicken, or fish, 1 egg, or 1/4 cup of tofu.  Eat some foods each day that contain healthy fats, such as avocado, nuts, seeds, and fish. Lifestyle   Check your blood glucose regularly.  Exercise at least  30 minutes 5 or more days each week, or as told by your health care provider.  Take medicines as told by your health care provider.  Do not use any products that contain nicotine or tobacco, such as cigarettes and e-cigarettes. If you need help quitting, ask your health care provider.  Work with a Social worker or diabetes educator to identify strategies to manage stress and any emotional and social challenges. What are some questions to ask my health care provider?  Do I need to meet with a diabetes educator?  Do I need to meet with a dietitian?  What number can I call if I have questions?  When are the best times to check my blood glucose? Where to find more information:  American Diabetes Association: diabetes.org/food-and-fitness/food  Academy of Nutrition and Dietetics: PokerClues.dk  [employer] of Diabetes and Digestive and Kidney Diseases (NIH): ContactWire.be Summary  A healthy meal plan will help you control your blood glucose and maintain a healthy lifestyle.  Working with a diet and nutrition specialist (dietitian) can help you make a meal plan that is best for you.  Keep in mind that carbohydrates and alcohol have immediate effects on your blood glucose levels. It is important to count carbohydrates and to use alcohol carefully. This information is not intended to replace advice given to you by your health care provider. Make sure you discuss any questions you have with your health care provider. Document Released: 12/28/2004 Document Revised: 05/07/2016 Document Reviewed: 05/07/2016 Elsevier Interactive Patient Education  [employer].

## 2017-06-07 NOTE — Assessment & Plan Note (Addendum)
Stable in the office today, continue Amlodipine 10 mg, lisinopril-HCTZ, metoprolol. Recent BMP stable.

## 2017-06-07 NOTE — Assessment & Plan Note (Signed)
Declines influenza and shingles vaccination, Pneumonia vaccinations UTD.  Mammogram and bone density scans due this April, orders placed. Colonoscopy due in 2020. Discussed the importance of a healthy diet and regular exercise in order for weight loss, and to reduce the risk of any potential medical problems. Exam unremarkable. Labs with hyperlipidemia, discussed. Follow up in 1 year.

## 2017-06-07 NOTE — Assessment & Plan Note (Signed)
Due for repeat colonoscopy in 2020. Will change metformin to extended release version to see if this lessens diarrhea.

## 2017-06-07 NOTE — Assessment & Plan Note (Signed)
Rate and rhythm regular today, continue Eliquis and Toprol Xl. Following with cardiology every 6 months.

## 2017-07-18 ENCOUNTER — Ambulatory Visit: Payer: Medicare Other | Admitting: Cardiovascular Disease

## 2017-07-18 ENCOUNTER — Encounter: Payer: Self-pay | Admitting: Cardiovascular Disease

## 2017-07-18 VITALS — BP 132/66 | HR 78 | Ht 66.0 in | Wt 195.1 lb

## 2017-07-18 DIAGNOSIS — I10 Essential (primary) hypertension: Secondary | ICD-10-CM | POA: Diagnosis not present

## 2017-07-18 DIAGNOSIS — I480 Paroxysmal atrial fibrillation: Secondary | ICD-10-CM

## 2017-07-18 NOTE — Patient Instructions (Signed)

## 2017-07-18 NOTE — Progress Notes (Signed)
OFFICE NOTE  Chief Complaint:   Occasional edema  Primary Care Physician: Carly Koch, NP  Problem list 1. Essential hypertension 2. Atrial fibrillation 3. Diabetes Mellitus    HPI:  Carly Buckley  Is a pleasant 72 year old female kindly referred to me by Dr. Birdie Buckley for palpitations. She recently saw her in the office for a routine physical and was found to have atrial fibrillation. She does have a history of palpitations has gone on for number of years     July 22, 2015:  I'm seeing Carly Buckley again today after a several year absence.   She has been seen by Dr. Debara Buckley in the recent past.   I have seen her and her husband in the past.  No CP or dyspnea.   Does not check her BP at home .  Can tell when she goes into atrial fib - lasts a few minute /  Has been walking 2 miles a day  - has had some recent knee issues and she has slacked off a bit   03/06/2016:  No pains or problems   Aug 24, 2016: Doing well. Is not exercising as much as she would like .  Aug. 31, 2018: [name] is seen today for follow up of her paroxysmal atrial fib . Walks with a cane now.  Has neuropathy - helps with balance  Is maintaining NSR   July 18, 2017  Doing well.  Very few palpitations.   BP and HR are normal  Exercises some   PMHx:  Past Medical History:  Diagnosis Date  . Arthritis   . Colon cancer ([place])    colon ca dx 07, colon resection  . Complication of anesthesia   . Cough 07/24/12   productive, ? color, no fever, congested   . Diabetes mellitus   . Diverticulitis   . Dysrhythmia    palpitations  . Helicobacter pylori (H. pylori)   . Hypertension   . Malignant neoplasm of colon   . Neuropathy, idiopathic   . PONV (postoperative nausea and vomiting)   . Type II diabetes mellitus without mention of complication     Past Surgical History:  Procedure Laterality Date  . COLON RESECTION  2007   colon cancer  . STERIOD INJECTION Left 08/04/2012   Procedure: STEROID  INJECTION;  Surgeon: Gearlean Alf, MD;  Location: [facility];  Service: Orthopedics;  Laterality: Left;  . TONSILLECTOMY    . TOTAL KNEE ARTHROPLASTY Right 08/04/2012   Procedure: TOTAL KNEE ARTHROPLASTY;  Surgeon: Gearlean Alf, MD;  Location: [facility];  Service: Orthopedics;  Laterality: Right;  . TUBAL LIGATION      FAMHx:  Family History  Problem Relation Age of Onset  . Lung cancer Mother 10  . Kidney failure Father 41  . Diabetes Sister   . Diabetes Brother   . Brain cancer Daughter     SOCHx:   reports that she quit smoking about 45 years ago. She has never used smokeless tobacco. She reports that she does not drink alcohol or use drugs.  ALLERGIES:  Allergies  Allergen Reactions  . Morphine And Related     Nauseated   . Oxycodone Nausea Only    ROS: Noted in current hx. Otherwise negative   HOME MEDS: Current Outpatient Medications  Medication Sig Dispense Refill  . amLODipine (NORVASC) 10 MG tablet Take 1 tablet (10 mg total) by mouth daily. 90 tablet 1  . apixaban (ELIQUIS) 5 MG TABS tablet  Take 1 tablet (5 mg total) by mouth 2 (two) times daily. 60 tablet 5  . Calcium Carb-Cholecalciferol (CALCIUM+D3 PO) Take 1 tablet by mouth daily.    . ciclopirox (LOPROX) 0.77 % cream Apply topically 2 (two) times daily. 30 g 0  . glipiZIDE (GLUCOTROL) 10 MG tablet Take 1 tablet (10 mg total) 2 (two) times daily by mouth. 180 tablet 1  . lisinopril-hydrochlorothiazide (PRINZIDE,ZESTORETIC) 20-25 MG tablet Take 1 tablet by mouth every morning. 90 tablet 1  . metFORMIN (GLUCOPHAGE-XR) 500 MG 24 hr tablet Take 2 tablets (1,000 mg total) by mouth 2 (two) times daily. 360 tablet 1  . ONE TOUCH ULTRA TEST test strip USE AS DIRECTED TO TEST BLOOD SUGAR 100 each 2  . Propylene Glycol (SYSTANE BALANCE) 0.6 % SOLN Place 1 drop into both eyes daily as needed (for dry eyes).    . metoprolol succinate (TOPROL-XL) 100 MG 24 hr tablet Take 1 tablet (100 mg total) by mouth daily. Take with or  immediately following a meal. 90 tablet 3   No current facility-administered medications for this visit.     LABS/IMAGING: No results found for this or any previous visit (from the past 48 hour(s)). No results found.  VITALS: BP 132/66   Pulse 78   Ht 5\' 6"  (1.676 m)   Wt 195 lb 1.9 oz (88.5 kg)   SpO2 96%   BMI 31.49 kg/m   Physical Exam: Blood pressure 132/66, pulse 78, height 5\' 6"  (1.676 m), weight 195 lb 1.9 oz (88.5 kg), SpO2 96 %.  GEN:  Well nourished, well developed in no acute distress HEENT: Normal NECK: No JVD; No carotid bruits LYMPHATICS: No lymphadenopathy CARDIAC: RR RESPIRATORY:  Clear to auscultation without rales, wheezing or rhonchi  ABDOMEN: Soft, non-tender, non-distended MUSCULOSKELETAL:  No edema; No deformity  SKIN: Warm and dry NEUROLOGIC:  Alert and oriented x 3    EKG:      Assessment/plan 1. Paroxysmal atrial fibrillation.     Her CHADS2VASC score is 4 - continue Eliquis  Stable ,   2. Hypertension: Continue current medications.    HR is well controlled.   Still eats lots of salty foods.  I've encouraged her to watch her salt intake. Will see her in 1 year   3. Type 2 diabetes mellitus with hypertension: Followed by her primary medical doctor. She does not measure her glucose levels at home.     Mertie Moores, MD  07/18/2017 5:06 PM    [facility] [place],  [place], [place]  [address] Pager [phone] Phone: [phone]; Fax: [phone]

## 2017-07-26 ENCOUNTER — Telehealth: Payer: Self-pay | Admitting: Primary Care

## 2017-07-26 ENCOUNTER — Emergency Department (HOSPITAL_COMMUNITY)
Admission: EM | Admit: 2017-07-26 | Discharge: 2017-07-26 | Disposition: A | Discharge status: Home / Self Care | Payer: Medicare Other | Attending: Emergency Medicine | Admitting: Emergency Medicine

## 2017-07-26 ENCOUNTER — Encounter (HOSPITAL_COMMUNITY): Payer: Self-pay | Admitting: Emergency Medicine

## 2017-07-26 DIAGNOSIS — T783XXA Angioneurotic edema, initial encounter: Secondary | ICD-10-CM | POA: Insufficient documentation

## 2017-07-26 DIAGNOSIS — I10 Essential (primary) hypertension: Secondary | ICD-10-CM | POA: Diagnosis not present

## 2017-07-26 DIAGNOSIS — E119 Type 2 diabetes mellitus without complications: Secondary | ICD-10-CM | POA: Diagnosis not present

## 2017-07-26 DIAGNOSIS — Z79899 Other long term (current) drug therapy: Secondary | ICD-10-CM | POA: Insufficient documentation

## 2017-07-26 DIAGNOSIS — Z87891 Personal history of nicotine dependence: Secondary | ICD-10-CM | POA: Diagnosis not present

## 2017-07-26 DIAGNOSIS — Z85038 Personal history of other malignant neoplasm of large intestine: Secondary | ICD-10-CM | POA: Diagnosis not present

## 2017-07-26 DIAGNOSIS — Z7984 Long term (current) use of oral hypoglycemic drugs: Secondary | ICD-10-CM | POA: Insufficient documentation

## 2017-07-26 DIAGNOSIS — R220 Localized swelling, mass and lump, head: Secondary | ICD-10-CM | POA: Diagnosis present

## 2017-07-26 MED ORDER — HYDROCHLOROTHIAZIDE 25 MG PO TABS: 25.0000 mg | ORAL_TABLET | Freq: Every day | ORAL | 0 refills | Status: DC

## 2017-07-26 NOTE — ED Triage Notes (Signed)
Pt reports this morning she fixed cup water and was adding grape flavoring to it and drink several swallows before noticed tongue felt funny. Looked in mirror and noticed tongue swelling and had difficulty swallowing. Went to UC in [place] and was given shot of Benadryl and advised to go to ED. Reports tongue feeling better at this time and no difficulty swallowing saliva.

## 2017-07-26 NOTE — Telephone Encounter (Signed)
Spoke with patient, notified of K. Clark, NP's recommendations.  She has hospital f/u set up with Allie Bossier, NP for Monday, 07/29/17 at 1015.  She states that she has no further questions at this time.

## 2017-07-26 NOTE — Telephone Encounter (Signed)
Please refer to ED note from Today regarding allergic reaction.  Appears ED has deferred to K. Clark, NP to make changes in her bp medication.  ED requested patient connect with physician today to make changes.

## 2017-07-26 NOTE — Telephone Encounter (Signed)
Please call patient and have her continue the hydrochlorothiazide 25 mg tablets that were prescribed by the doctor in the ED. Continue metoprolol and Amlodipine as prescribed and monitor her BP over the weekend. Please have her see me in the office for ED follow up next week at her convenience.

## 2017-07-26 NOTE — Discharge Instructions (Addendum)
Your tongue swelling could have been from lisinopril or from the grape juice.  Do not take either one.  Call your doctor today to adjust your blood pressure medicines.  If you develop worsening tongue swelling or you noticed difficulty breathing, talking, swallowing, or any other new/concerning symptoms return to the ER or call 911 immediately.  You may take Benadryl every 4-6 hours as needed.

## 2017-07-26 NOTE — ED Provider Notes (Signed)
[facility] DEPT Provider Note   CSN: [id number] Arrival date & time: 07/26/17  1109     History   Chief Complaint Chief Complaint  Patient presents with  . Oral Swelling    HPI Carly Buckley is a 72 y.o. female.  HPI  72 year old female presents with tongue swelling.  She states that earlier this morning she was mixing grape juice and water.  She did not realize that the concentration she was using was supposed to be for a larger amount of water and thinks she made it stronger than typical.  Sometime after drinking this she felt like her tongue felt funny and she noticed swelling to the left lateral aspect of her tongue.  She went to urgent care because she felt like she was having some more trouble swallowing and did not wanted to grow large enough to make her dyspneic.  She has not had any vomiting or rash.  She was given 50 mg IM Benadryl and sent here for further evaluation.  She has been on a stable dose of lisinopril/HCTZ for many years.  Currently since receiving the Benadryl and driving up here she feels like the tongue swelling is improving and only mildly swollen.  Has had no difficulty swallowing.  Past Medical History:  Diagnosis Date  . Arthritis   . Colon cancer ([place])    colon ca dx 07, colon resection  . Complication of anesthesia   . Cough 07/24/12   productive, ? color, no fever, congested   . Diabetes mellitus   . Diverticulitis   . Dysrhythmia    palpitations  . Helicobacter pylori (H. pylori)   . Hypertension   . Malignant neoplasm of colon   . Neuropathy, idiopathic   . PONV (postoperative nausea and vomiting)   . Type II diabetes mellitus without mention of complication     Patient Active Problem List   Diagnosis Date Noted  . Preventative health care 06/07/2017  . Hyperlipidemia 06/07/2017  . Rash and nonspecific skin eruption 07/24/2016  . Vaginal dryness 05/08/2016  . Obesity 11/30/2014  . Abnormal EKG 05/24/2014    . DOE (dyspnea on exertion) 05/24/2014  . A-fib ([place]) 04/21/2014  . RAD (reactive airway disease) 10/30/2013  . Colon cancer ([place]) 08/25/2013  . Insomnia 08/26/2012  . Tachycardia 08/19/2012  . Postoperative anemia due to acute blood loss 08/05/2012  . OA (osteoarthritis) of knee 08/04/2012  . Medicare annual wellness visit, subsequent 05/24/2011  . DM (diabetes mellitus) ([place]) 03/21/2011  . HTN (hypertension) 03/21/2011    Past Surgical History:  Procedure Laterality Date  . COLON RESECTION  2007   colon cancer  . STERIOD INJECTION Left 08/04/2012   Procedure: STEROID INJECTION;  Surgeon: Gearlean Alf, MD;  Location: [facility];  Service: Orthopedics;  Laterality: Left;  . TONSILLECTOMY    . TOTAL KNEE ARTHROPLASTY Right 08/04/2012   Procedure: TOTAL KNEE ARTHROPLASTY;  Surgeon: Gearlean Alf, MD;  Location: [facility];  Service: Orthopedics;  Laterality: Right;  . TUBAL LIGATION       OB History   None      Home Medications    Prior to Admission medications   Medication Sig Start Date End Date Taking? Authorizing Provider  amLODipine (NORVASC) 10 MG tablet Take 1 tablet (10 mg total) by mouth daily. 05/17/17   Pleas Koch, NP  apixaban (ELIQUIS) 5 MG TABS tablet Take 1 tablet (5 mg total) by mouth 2 (two) times daily. 04/17/17  Nahser, Wonda Cheng, MD  Calcium Carb-Cholecalciferol (CALCIUM+D3 PO) Take 1 tablet by mouth daily.    [provider]  ciclopirox (LOPROX) 0.77 % cream Apply topically 2 (two) times daily. 07/24/16   Pleas Koch, NP  glipiZIDE (GLUCOTROL) 10 MG tablet Take 1 tablet (10 mg total) 2 (two) times daily by mouth. 02/25/17 02/25/18  Pleas Koch, NP  hydrochlorothiazide (HYDRODIURIL) 25 MG tablet Take 1 tablet (25 mg total) by mouth daily. 07/26/17   Sherwood Gambler, MD  metFORMIN (GLUCOPHAGE-XR) 500 MG 24 hr tablet Take 2 tablets (1,000 mg total) by mouth 2 (two) times daily. 06/07/17   Pleas Koch, NP  metoprolol succinate  (TOPROL-XL) 100 MG 24 hr tablet Take 1 tablet (100 mg total) by mouth daily. Take with or immediately following a meal. 08/24/16 02/27/17  Nahser, Wonda Cheng, MD  ONE TOUCH ULTRA TEST test strip USE AS DIRECTED TO TEST BLOOD SUGAR 08/11/12   Midge Minium, MD  Propylene Glycol (SYSTANE BALANCE) 0.6 % SOLN Place 1 drop into both eyes daily as needed (for dry eyes).    [provider]    Family History Family History  Problem Relation Age of Onset  . Lung cancer Mother 75  . Kidney failure Father 92  . Diabetes Sister   . Diabetes Brother   . Brain cancer Daughter     Social History Social History   Tobacco Use  . Smoking status: Former Smoker    Last attempt to quit: 06/18/1972    Years since quitting: 45.1  . Smokeless tobacco: Never Used  Substance Use Topics  . Alcohol use: No  . Drug use: No     Allergies   Lisinopril; Morphine and related; and Oxycodone   Review of Systems Review of Systems  HENT: Positive for trouble swallowing.        Tongue swelling  Respiratory: Negative for shortness of breath.   Gastrointestinal: Negative for vomiting.  Skin: Negative for rash.  All other systems reviewed and are negative.    Physical Exam Updated Vital Signs BP (!) 159/68 (BP Location: Left Arm)   Pulse 86   Temp (!) 97.3 F (36.3 C) (Oral)   Resp 20   Ht 5' 5.5" (1.664 m)   Wt 88.5 kg (195 lb)   SpO2 98%   BMI 31.96 kg/m   Physical Exam  Constitutional: She appears well-developed and well-nourished. No distress.  HENT:  Head: Normocephalic and atraumatic.  Right Ear: External ear normal.  Left Ear: External ear normal.  Nose: Nose normal.  Mouth/Throat: No trismus in the jaw.  Left lateral tongue is mildly swollen with some darkening to the lateral aspect compared to right. No lip swelling. No hoarse voice  Eyes: Right eye exhibits no discharge. Left eye exhibits no discharge.  Neck: Neck supple.  Cardiovascular: Normal rate, regular rhythm and  normal heart sounds.  Pulmonary/Chest: Effort normal and breath sounds normal. No stridor. She has no wheezes.  Abdominal: She exhibits no distension.  Neurological: She is alert.  Skin: Skin is warm and dry. No rash noted. She is not diaphoretic.  Nursing note and vitals reviewed.    ED Treatments / Results  Labs (all labs ordered are listed, but only abnormal results are displayed) Labs Reviewed - No data to display  EKG None  Radiology No results found.  Procedures Procedures (including critical care time)  Medications Ordered in ED Medications - No data to display   Initial Impression / Assessment  and Plan / ED Course  I have reviewed the triage vital signs and the nursing notes.  Pertinent labs & imaging results that were available during my care of the patient were reviewed by me and considered in my medical decision making (see chart for details).     Patient currently has mild angioedema.  This involves only the lateral aspect of her tongue.  Given this is improving I feel no further emergency treatment is necessary.  It is possible that this is from the grape juice that she has never had this concentration or type before.  However I would also expect other abnormality such as lip swelling or rash.  Thus I am more concerned it might be from lisinopril.  I will tell her to stop both and will write her a prescription for the HCTZ.  Call her physician today to help with outpatient management of blood pressure.  Otherwise I discussed strict return precautions and discussed that she can use Benadryl every 4-6 hours as needed.  This is in case it is an allergic reaction to the juice.  Final Clinical Impressions(s) / ED Diagnoses   Final diagnoses:  Angioedema, initial encounter    ED Discharge Orders        Ordered    hydrochlorothiazide (HYDRODIURIL) 25 MG tablet  Daily     07/26/17 1200       Sherwood Gambler, MD 07/26/17 1228

## 2017-07-26 NOTE — Telephone Encounter (Signed)
Copied from [place] [id number]. Topic: Quick Communication - See Telephone Encounter >> Jul 26, 2017  2:09 PM Corie Chiquito, [place] wrote: CRM for notification. Patient calling because she would like to speak with Ms.Clark or her nurse. Stated that she had allergic reaction to her bp medication Linsinopril 20-25 mg this morning.( Didn't triage pt due to the fact she has already been to the ER per the NT) Stated that she went to the ER and the doctor took her off of the medication and he gave her Hydrochlorothiazide 25 mg. She would like to know if Ms.Clark could call her in a different bp medication other than the Linsinopril 20-25 mg and she would need the prescription to go to the [facility] [place] [phone]. She also would like to know when she receives her new bp medication should she stop taking the Hydrochlorothiazide 25 mg. If someone could give her a call back about this at [phone]

## 2017-07-29 ENCOUNTER — Encounter: Payer: Self-pay | Admitting: Primary Care

## 2017-07-29 ENCOUNTER — Ambulatory Visit (INDEPENDENT_AMBULATORY_CARE_PROVIDER_SITE_OTHER): Payer: Medicare Other | Admitting: Primary Care

## 2017-07-29 VITALS — BP 136/66 | HR 86 | Temp 98.0°F | Ht 65.5 in | Wt 193.0 lb

## 2017-07-29 DIAGNOSIS — I10 Essential (primary) hypertension: Secondary | ICD-10-CM

## 2017-07-29 MED ORDER — HYDROCHLOROTHIAZIDE 25 MG PO TABS: ORAL_TABLET | ORAL | 1 refills | Status: DC

## 2017-07-29 NOTE — Progress Notes (Signed)
Subjective:    Patient ID: Carly Buckley, female    DOB: 01/18/1946, 72 y.o.   MRN: [id number]  HPI  Carly Buckley is a 72 year old female who presents today for hospital follow up.  1) Hospital Follow Up:  She presented to [facility] Emergency Department on 07/26/17 with a chief complaint of left lateral tongue swelling several hours after grape flavored powder and water. She initially presented to Urgent Care due to difficulty swallowing, she was provided with Benadryl 50 mg IM and sent to the ED for further evaluation.  During her ED stay she was noted to have mild angioedema. She was managed on lisinopril-HCTZ so she was told to stop taking and was provided with a prescription for HCTZ 25 mg. Her swelling decreased and she had no further problems so she was discharged home later that afternoon with return precautions.   Since her emergency department stay she denies oral swelling, shortness of breath, difficulty swallowing. She is compliant to her HCTZ 25 mg. She is checking her BP at home and says it's "running good". She cannot recall her readings.   BP Readings from Last 3 Encounters:  07/29/17 136/66  07/26/17 (!) 159/68  07/18/17 132/66     Review of Systems  Constitutional: Negative for fever.  HENT: Negative for trouble swallowing and voice change.        Denies tongue swelling  Respiratory: Negative for shortness of breath.        Past Medical History:  Diagnosis Date  . Arthritis   . Colon cancer ([place])    colon ca dx 07, colon resection  . Complication of anesthesia   . Cough 07/24/12   productive, ? color, no fever, congested   . Diabetes mellitus   . Diverticulitis   . Dysrhythmia    palpitations  . Helicobacter pylori (H. pylori)   . Hypertension   . Malignant neoplasm of colon   . Neuropathy, idiopathic   . PONV (postoperative nausea and vomiting)   . Type II diabetes mellitus without mention of complication      Social History   Socioeconomic  History  . Marital status: Married    Spouse name: Not on file  . Number of children: Not on file  . Years of education: Not on file  . Highest education level: Not on file  Occupational History  . Not on file  Social Needs  . Financial resource strain: Not on file  . Food insecurity:    Worry: Not on file    Inability: Not on file  . Transportation needs:    Medical: Not on file    Non-medical: Not on file  Tobacco Use  . Smoking status: Former Smoker    Last attempt to quit: 06/18/1972    Years since quitting: 45.1  . Smokeless tobacco: Never Used  Substance and Sexual Activity  . Alcohol use: No  . Drug use: No  . Sexual activity: Not Currently  Lifestyle  . Physical activity:    Days per week: Not on file    Minutes per session: Not on file  . Stress: Not on file  Relationships  . Social connections:    Talks on phone: Not on file    Gets together: Not on file    Attends religious service: Not on file    Active member of club or organization: Not on file    Attends meetings of clubs or organizations: Not on file  Relationship status: Not on file  . Intimate partner violence:    Fear of current or ex partner: Not on file    Emotionally abused: Not on file    Physically abused: Not on file    Forced sexual activity: Not on file  Other Topics Concern  . Not on file  Social History Narrative   Married.   4 children, 7 grandchildren, 3 great grandchildren.   Retired. Once worked at [employer]	[place].   Enjoys walking, puzzle books, spending time with family.    Past Surgical History:  Procedure Laterality Date  . COLON RESECTION  2007   colon cancer  . STERIOD INJECTION Left 08/04/2012   Procedure: STEROID INJECTION;  Surgeon: Gearlean Alf, MD;  Location: [facility];  Service: Orthopedics;  Laterality: Left;  . TONSILLECTOMY    . TOTAL KNEE ARTHROPLASTY Right 08/04/2012   Procedure: TOTAL KNEE ARTHROPLASTY;  Surgeon: Gearlean Alf, MD;  Location: [facility];   Service: Orthopedics;  Laterality: Right;  . TUBAL LIGATION      Family History  Problem Relation Age of Onset  . Lung cancer Mother 15  . Kidney failure Father 77  . Diabetes Sister   . Diabetes Brother   . Brain cancer Daughter     Allergies  Allergen Reactions  . Lisinopril Swelling  . Morphine And Related     Nauseated   . Oxycodone Nausea Only    Current Outpatient Medications on File Prior to Visit  Medication Sig Dispense Refill  . amLODipine (NORVASC) 10 MG tablet Take 1 tablet (10 mg total) by mouth daily. 90 tablet 1  . apixaban (ELIQUIS) 5 MG TABS tablet Take 1 tablet (5 mg total) by mouth 2 (two) times daily. 60 tablet 5  . Calcium Carb-Cholecalciferol (CALCIUM+D3 PO) Take 1 tablet by mouth daily.    Marland Kitchen glipiZIDE (GLUCOTROL) 10 MG tablet Take 1 tablet (10 mg total) 2 (two) times daily by mouth. 180 tablet 1  . metFORMIN (GLUCOPHAGE-XR) 500 MG 24 hr tablet Take 2 tablets (1,000 mg total) by mouth 2 (two) times daily. 360 tablet 1  . Multiple Vitamin (MULTIVITAMIN WITH MINERALS) TABS tablet Take 1 tablet by mouth daily.    . Multiple Vitamins-Minerals (EYE VITAMINS & MINERALS PO) Take 2 capsules by mouth daily.    . ONE TOUCH ULTRA TEST test strip USE AS DIRECTED TO TEST BLOOD SUGAR 100 each 2  . Propylene Glycol (SYSTANE BALANCE) 0.6 % SOLN Place 1 drop into both eyes daily as needed (for dry eyes).    . ciclopirox (LOPROX) 0.77 % cream Apply topically 2 (two) times daily. (Patient not taking: Reported on 07/26/2017) 30 g 0  . metoprolol succinate (TOPROL-XL) 100 MG 24 hr tablet Take 1 tablet (100 mg total) by mouth daily. Take with or immediately following a meal. 90 tablet 3   No current facility-administered medications on file prior to visit.     BP 136/66   Pulse 86   Temp 98 F (36.7 C) (Oral)   Ht 5' 5.5" (1.664 m)   Wt 193 lb (87.5 kg)   SpO2 97%   BMI 31.63 kg/m    Objective:   Physical Exam  Constitutional: She appears well-nourished.  HENT:    Mouth/Throat: Oropharynx is clear and moist.  No evidence of angioedema   Neck: Neck supple.  Cardiovascular: Normal rate and regular rhythm.  Pulmonary/Chest: Effort normal and breath sounds normal.  Skin: Skin is warm and dry.  Assessment & Plan:

## 2017-07-29 NOTE — Patient Instructions (Signed)
Continue taking hydrochlorothiazide 25 mg tablets for high blood pressure.   Only take Metformin ER 500 mg tablets for diabetes. Take 2 tablets twice daily for diabetes.   Continue to monitor your blood pressure and report readings at or above 140/90.  We will see you in August as scheduled.  It was a pleasure to see you today!

## 2017-07-29 NOTE — Assessment & Plan Note (Signed)
Presented to [facility] with oral swelling. Seems to have had angioedema secondary to use of ACE. Will continue off of ACE. Continue HCTZ 25 mg, metoprolol succinate 100 mg, amlodipine 10 mg. Exam today unremarkable.   All hospital notes reviewed.

## 2017-08-05 ENCOUNTER — Other Ambulatory Visit: Payer: Self-pay | Admitting: Nurse Practitioner

## 2017-08-05 MED ORDER — APIXABAN 5 MG PO TABS: 5.0000 mg | ORAL_TABLET | Freq: Two times a day (BID) | ORAL | 11 refills | Status: DC

## 2017-08-09 ENCOUNTER — Encounter: Payer: Self-pay | Admitting: Primary Care

## 2017-08-12 ENCOUNTER — Other Ambulatory Visit: Payer: Self-pay

## 2017-08-12 MED ORDER — METOPROLOL SUCCINATE ER 100 MG PO TB24: 100.0000 mg | ORAL_TABLET | Freq: Every day | ORAL | 2 refills | Status: DC

## 2017-08-20 ENCOUNTER — Other Ambulatory Visit: Payer: Self-pay | Admitting: Primary Care

## 2017-08-20 DIAGNOSIS — E1140 Type 2 diabetes mellitus with diabetic neuropathy, unspecified: Secondary | ICD-10-CM

## 2017-08-27 ENCOUNTER — Other Ambulatory Visit: Payer: Self-pay | Admitting: Primary Care

## 2017-08-27 DIAGNOSIS — E782 Mixed hyperlipidemia: Secondary | ICD-10-CM

## 2017-08-27 DIAGNOSIS — E1165 Type 2 diabetes mellitus with hyperglycemia: Secondary | ICD-10-CM

## 2017-08-27 DIAGNOSIS — Z794 Long term (current) use of insulin: Principal | ICD-10-CM

## 2017-09-04 ENCOUNTER — Other Ambulatory Visit (INDEPENDENT_AMBULATORY_CARE_PROVIDER_SITE_OTHER): Payer: Medicare Other

## 2017-09-04 DIAGNOSIS — Z794 Long term (current) use of insulin: Secondary | ICD-10-CM

## 2017-09-04 DIAGNOSIS — E1165 Type 2 diabetes mellitus with hyperglycemia: Secondary | ICD-10-CM | POA: Diagnosis not present

## 2017-09-04 DIAGNOSIS — E782 Mixed hyperlipidemia: Secondary | ICD-10-CM

## 2017-09-04 LAB — LIPID PANEL
CHOL/HDL RATIO: 3
Cholesterol: 169 mg/dL (ref 0–200)
HDL: 55.7 mg/dL (ref >39.00)
LDL CALC: 102 mg/dL — AB (ref 0–99)
NONHDL: 113.28
Triglycerides: 58 mg/dL (ref 0.0–149.0)
VLDL: 11.6 mg/dL (ref 0.0–40.0)

## 2017-09-04 LAB — MICROALBUMIN / CREATININE URINE RATIO
Creatinine,U: 22.5 mg/dL
Microalb Creat Ratio: 3.1 mg/g (ref 0.0–30.0)

## 2017-09-04 LAB — HEMOGLOBIN A1C: HEMOGLOBIN A1C: 8.1 % — AB (ref 4.6–6.5)

## 2017-09-05 ENCOUNTER — Encounter: Payer: Self-pay | Admitting: *Deleted

## 2017-11-07 ENCOUNTER — Other Ambulatory Visit: Payer: Self-pay | Admitting: Primary Care

## 2017-11-07 DIAGNOSIS — I10 Essential (primary) hypertension: Secondary | ICD-10-CM

## 2017-11-18 ENCOUNTER — Other Ambulatory Visit: Payer: Self-pay | Admitting: Primary Care

## 2017-11-18 DIAGNOSIS — E1140 Type 2 diabetes mellitus with diabetic neuropathy, unspecified: Secondary | ICD-10-CM

## 2017-12-09 ENCOUNTER — Ambulatory Visit: Payer: Medicare Other | Admitting: Primary Care

## 2017-12-09 ENCOUNTER — Encounter: Payer: Self-pay | Admitting: Primary Care

## 2017-12-09 VITALS — BP 132/70 | HR 83 | Temp 98.2°F | Ht 65.5 in | Wt 192.5 lb

## 2017-12-09 DIAGNOSIS — E785 Hyperlipidemia, unspecified: Secondary | ICD-10-CM | POA: Diagnosis not present

## 2017-12-09 DIAGNOSIS — E1140 Type 2 diabetes mellitus with diabetic neuropathy, unspecified: Secondary | ICD-10-CM | POA: Diagnosis not present

## 2017-12-09 LAB — POCT GLYCOSYLATED HEMOGLOBIN (HGB A1C): HEMOGLOBIN A1C: 8.8 % — AB (ref 4.0–5.6)

## 2017-12-09 NOTE — Progress Notes (Signed)
Subjective:    Patient ID: Carly Buckley, female    DOB: 11-Sep-1945, 72 y.o.   MRN: [id number]  HPI  Carly Buckley is a 72 year old female who presents today for follow up.  Current medications include: Glipizide 10 mg BID, Metformin XR 1000 mg BID. She's been taking Metformin XR 500 mg BID.    Last A1C: 8.1 Last Eye Exam: Due in December 2019 Last Foot Exam: Completed in February 2019 Pneumonia Vaccination: Completed in 2018 ACE/ARB: Urine microalbumin negative in May 2019 Statin: None. LDL of 102  The 10-year ASCVD risk score [name], et al., 2013) is: 28.4%   Values used to calculate the score:     Age: 62 years     Sex: Female     Is Non-Hispanic African American: No     Diabetic: Yes     Tobacco smoker: No     Systolic Blood Pressure: 176 mmHg     Is BP treated: Yes     HDL Cholesterol: 55.7 mg/dL     Total Cholesterol: 169 mg/dL   Diet currently consists of:  Breakfast: Grits, eggs, cereal, oatmeal Lunch: Salad, soup Dinner: Salad, soup, grilled chicken, frozen dinners Snacks: Crackers, fruit Desserts: None Beverages: Water, diet tea, coffee, diet soda  Exercise: She started walking daily last week.        Review of Systems  Eyes: Negative for visual disturbance.  Respiratory: Negative for shortness of breath.   Cardiovascular: Negative for chest pain.  Neurological: Negative for dizziness and weakness.       Past Medical History:  Diagnosis Date  . Angioedema   . Arthritis   . Colon cancer ([place])    colon ca dx 07, colon resection  . Complication of anesthesia   . Diabetes mellitus   . Diverticulitis   . Dysrhythmia    palpitations  . Helicobacter pylori (H. pylori)   . Hypertension   . Malignant neoplasm of colon   . Neuropathy, idiopathic   . PONV (postoperative nausea and vomiting)   . Type II diabetes mellitus without mention of complication      Social History   Socioeconomic History  . Marital status: Married    Spouse name:  Not on file  . Number of children: Not on file  . Years of education: Not on file  . Highest education level: Not on file  Occupational History  . Not on file  Social Needs  . Financial resource strain: Not on file  . Food insecurity:    Worry: Not on file    Inability: Not on file  . Transportation needs:    Medical: Not on file    Non-medical: Not on file  Tobacco Use  . Smoking status: Former Smoker    Last attempt to quit: 06/18/1972    Years since quitting: 45.5  . Smokeless tobacco: Never Used  Substance and Sexual Activity  . Alcohol use: No  . Drug use: No  . Sexual activity: Not Currently  Lifestyle  . Physical activity:    Days per week: Not on file    Minutes per session: Not on file  . Stress: Not on file  Relationships  . Social connections:    Talks on phone: Not on file    Gets together: Not on file    Attends religious service: Not on file    Active member of club or organization: Not on file    Attends meetings of clubs or  organizations: Not on file    Relationship status: Not on file  . Intimate partner violence:    Fear of current or ex partner: Not on file    Emotionally abused: Not on file    Physically abused: Not on file    Forced sexual activity: Not on file  Other Topics Concern  . Not on file  Social History Narrative   Married.   4 children, 7 grandchildren, 3 great grandchildren.   Retired. Once worked at [employer]	[place].   Enjoys walking, puzzle books, spending time with family.    Past Surgical History:  Procedure Laterality Date  . COLON RESECTION  2007   colon cancer  . STERIOD INJECTION Left 08/04/2012   Procedure: STEROID INJECTION;  Surgeon: Gearlean Alf, MD;  Location: [facility];  Service: Orthopedics;  Laterality: Left;  . TONSILLECTOMY    . TOTAL KNEE ARTHROPLASTY Right 08/04/2012   Procedure: TOTAL KNEE ARTHROPLASTY;  Surgeon: Gearlean Alf, MD;  Location: [facility];  Service: Orthopedics;  Laterality: Right;  . TUBAL  LIGATION      Family History  Problem Relation Age of Onset  . Lung cancer Mother 79  . Kidney failure Father 51  . Diabetes Sister   . Diabetes Brother   . Brain cancer Daughter     Allergies  Allergen Reactions  . Lisinopril Swelling  . Morphine And Related     Nauseated   . Oxycodone Nausea Only    Current Outpatient Medications on File Prior to Visit  Medication Sig Dispense Refill  . amLODipine (NORVASC) 10 MG tablet TAKE 1 TABLET BY MOUTH DAILY 90 tablet 1  . apixaban (ELIQUIS) 5 MG TABS tablet Take 1 tablet (5 mg total) by mouth 2 (two) times daily. 60 tablet 11  . Calcium Carb-Cholecalciferol (CALCIUM+D3 PO) Take 1 tablet by mouth daily.    Marland Kitchen glipiZIDE (GLUCOTROL) 10 MG tablet TAKE 1 TABLET BY MOUTH TWICE DAILY 180 tablet 0  . hydrochlorothiazide (HYDRODIURIL) 25 MG tablet Take 1 tablet by mouth once daily for high blood pressure. 90 tablet 1  . metFORMIN (GLUCOPHAGE-XR) 500 MG 24 hr tablet Take 2 tablets (1,000 mg total) by mouth 2 (two) times daily. 360 tablet 1  . metoprolol succinate (TOPROL-XL) 100 MG 24 hr tablet Take 1 tablet (100 mg total) by mouth daily. Take with or immediately following a meal. 90 tablet 2  . Multiple Vitamin (MULTIVITAMIN WITH MINERALS) TABS tablet Take 1 tablet by mouth daily.    . Multiple Vitamins-Minerals (EYE VITAMINS & MINERALS PO) Take 2 capsules by mouth daily.    . ONE TOUCH ULTRA TEST test strip USE AS DIRECTED TO TEST BLOOD SUGAR 100 each 2  . Propylene Glycol (SYSTANE BALANCE) 0.6 % SOLN Place 1 drop into both eyes daily as needed (for dry eyes).    . ciclopirox (LOPROX) 0.77 % cream Apply topically 2 (two) times daily. (Patient not taking: Reported on 12/09/2017) 30 g 0   No current facility-administered medications on file prior to visit.     BP 132/70   Pulse 83   Temp 98.2 F (36.8 C) (Oral)   Ht 5' 5.5" (1.664 m)   Wt 192 lb 8 oz (87.3 kg)   SpO2 96%   BMI 31.55 kg/m    Objective:   Physical Exam  Constitutional:  She appears well-nourished.  Neck: Neck supple.  Cardiovascular: Normal rate and regular rhythm.  Respiratory: Effort normal and breath sounds normal.  Skin: Skin is  warm and dry.           Assessment & Plan:

## 2017-12-09 NOTE — Patient Instructions (Addendum)
Start taking two tablets of your Metformin in the morning and two tablets of your Metformin in the evening.   Take your Glipizide in the morning and in the evening. You can take this with your Metformin.  Continue walking. Make sure to get 150 minutes of exercise weekly.  It is important that you improve your diet. Please limit carbohydrates in the form of white bread, rice, pasta, sweets, fast food, fried food, sugary drinks, etc. Increase your consumption of fresh fruits and vegetables, whole grains, lean protein.  Ensure you are consuming 64 ounces of water daily.  We will see you in 6 months for your physical.  It was a pleasure to see you today!

## 2017-12-09 NOTE — Assessment & Plan Note (Signed)
Repeat A1C of 8.8 which is worse than last visit. She isn't taking Metformin as prescribed, encouraged her to start taking 1000 mg BID. Also continue glipizide.  Long discussion regarding effects of uncontrolled diabetes. We did not have the results of her A1C during her visit this afternoon. Would recommend adding in another oral agent, will offer this to her.   Repeat A1C in 3 months. She refuses statin treatment despite ASCVD risk score of 28%. Discussed risk for heart disease/stroke.

## 2017-12-09 NOTE — Assessment & Plan Note (Signed)
LDL above goal in May 2019. She continues to refuse statin treatment. Long discussion about the risks of uncontrolled hyperlipidemia in the setting of diabetes and hypertension.

## 2017-12-10 ENCOUNTER — Other Ambulatory Visit: Payer: Self-pay | Admitting: Primary Care

## 2017-12-10 DIAGNOSIS — E1140 Type 2 diabetes mellitus with diabetic neuropathy, unspecified: Secondary | ICD-10-CM

## 2017-12-12 ENCOUNTER — Encounter: Payer: Self-pay | Admitting: *Deleted

## 2018-02-03 ENCOUNTER — Other Ambulatory Visit: Payer: Self-pay | Admitting: Primary Care

## 2018-02-03 DIAGNOSIS — E1140 Type 2 diabetes mellitus with diabetic neuropathy, unspecified: Secondary | ICD-10-CM

## 2018-02-14 ENCOUNTER — Other Ambulatory Visit: Payer: Self-pay | Admitting: Primary Care

## 2018-02-14 DIAGNOSIS — E1140 Type 2 diabetes mellitus with diabetic neuropathy, unspecified: Secondary | ICD-10-CM

## 2018-02-14 DIAGNOSIS — I10 Essential (primary) hypertension: Secondary | ICD-10-CM

## 2018-04-04 LAB — HM DIABETES EYE EXAM

## 2018-04-07 ENCOUNTER — Encounter: Payer: Self-pay | Admitting: Primary Care

## 2018-05-05 ENCOUNTER — Other Ambulatory Visit: Payer: Self-pay | Admitting: Cardiovascular Disease

## 2018-05-05 ENCOUNTER — Other Ambulatory Visit: Payer: Self-pay | Admitting: Primary Care

## 2018-05-05 DIAGNOSIS — I10 Essential (primary) hypertension: Secondary | ICD-10-CM

## 2018-05-17 ENCOUNTER — Other Ambulatory Visit: Payer: Self-pay | Admitting: Primary Care

## 2018-05-17 DIAGNOSIS — I10 Essential (primary) hypertension: Secondary | ICD-10-CM

## 2018-05-20 ENCOUNTER — Other Ambulatory Visit: Payer: Self-pay | Admitting: Primary Care

## 2018-05-20 DIAGNOSIS — I10 Essential (primary) hypertension: Secondary | ICD-10-CM

## 2018-06-05 ENCOUNTER — Ambulatory Visit (INDEPENDENT_AMBULATORY_CARE_PROVIDER_SITE_OTHER): Payer: Medicare Other

## 2018-06-05 VITALS — BP 142/76 | HR 80 | Temp 97.7°F | Ht 65.5 in | Wt 196.7 lb

## 2018-06-05 DIAGNOSIS — E785 Hyperlipidemia, unspecified: Secondary | ICD-10-CM | POA: Diagnosis not present

## 2018-06-05 DIAGNOSIS — E119 Type 2 diabetes mellitus without complications: Secondary | ICD-10-CM | POA: Diagnosis not present

## 2018-06-05 DIAGNOSIS — Z0000 Encounter for general adult medical examination without abnormal findings: Secondary | ICD-10-CM

## 2018-06-05 DIAGNOSIS — Z23 Encounter for immunization: Secondary | ICD-10-CM | POA: Diagnosis not present

## 2018-06-05 LAB — LIPID PANEL
Cholesterol: 180 mg/dL (ref 0–200)
HDL: 61.4 mg/dL (ref >39.00)
LDL CALC: 99 mg/dL (ref 0–99)
NonHDL: 118.34
Total CHOL/HDL Ratio: 3
Triglycerides: 95 mg/dL (ref 0.0–149.0)
VLDL: 19 mg/dL (ref 0.0–40.0)

## 2018-06-05 LAB — COMPREHENSIVE METABOLIC PANEL
ALBUMIN: 4.6 g/dL (ref 3.5–5.2)
ALT: 18 U/L (ref 0–35)
AST: 16 U/L (ref 0–37)
Alkaline Phosphatase: 66 U/L (ref 39–117)
BUN: 21 mg/dL (ref 6–23)
CHLORIDE: 95 meq/L — AB (ref 96–112)
CO2: 30 meq/L (ref 19–32)
CREATININE: 0.71 mg/dL (ref 0.40–1.20)
Calcium: 10.3 mg/dL (ref 8.4–10.5)
GFR: 80.81 mL/min (ref >60.00)
Glucose, Bld: 227 mg/dL — ABNORMAL HIGH (ref 70–99)
Potassium: 3.6 mEq/L (ref 3.5–5.1)
SODIUM: 135 meq/L (ref 135–145)
Total Bilirubin: 0.4 mg/dL (ref 0.2–1.2)
Total Protein: 7.8 g/dL (ref 6.0–8.3)

## 2018-06-05 LAB — HEMOGLOBIN A1C: HEMOGLOBIN A1C: 8.2 % — AB (ref 4.6–6.5)

## 2018-06-05 NOTE — Progress Notes (Signed)
PCP notes:   Health maintenance:  Tetanus vaccine - postponed/insurance Flu vaccine - administered  Abnormal screenings:   Mini-Cog score: 17/20 (see note below) MMSE - Mini Mental State Exam 06/05/2018 05/31/2017  Orientation to time 5 5  Orientation to Place 5 5  Registration 3 3  Attention/ Calculation 0 0  Recall 3 3  Language- name 2 objects 0 0  Language- repeat 1 1  Language- follow 3 step command 0 1  Language- follow 3 step command-comments Refused to complete clock drawing unable to follow 1 step of 3 step command; 3 attempts to complete clock  Language- read & follow direction 0 0  Write a sentence 0 0  Copy design 0 0  Total score 17 18    Patient concerns:   None  Nurse concerns:  None  Next PCP appt:   06/13/18 @ 0820

## 2018-06-05 NOTE — Progress Notes (Signed)
Subjective:   Carly Buckley is a 73 y.o. female who presents for Medicare Annual (Subsequent) preventive examination.  Review of Systems:  N/A Cardiac Risk Factors include: advanced age (>24men, >29 women);obesity (BMI >30kg/m2);dyslipidemia;diabetes mellitus;hypertension     Objective:     Vitals: BP (!) 142/76 (BP Location: Right Arm, Patient Position: Sitting, Cuff Size: Normal)   Pulse 80   Temp 97.7 F (36.5 C) (Oral)   Ht 5' 5.5" (1.664 m) Comment: shoes  Wt 196 lb 11.2 oz (89.2 kg)   SpO2 98%   BMI 32.23 kg/m   Body mass index is 32.23 kg/m.  Advanced Directives 06/05/2018 05/31/2017 08/04/2012 08/04/2012 07/28/2012 06/18/2012  Does Patient Have a Medical Advance Directive? Yes Yes Patient has advance directive, copy not in chart Patient has advance directive, copy not in chart Patient has advance directive, copy not in chart Patient has advance directive, copy not in chart  Type of Advance Directive [place];Living will [place];Living will Living will Living will (No Data) -  Copy of [place] in Chart? No - copy requested No - copy requested Copy requested from family Copy requested from family Copy requested from family Copy requested from family  Pre-existing out of facility DNR order (yellow form or pink MOST form) - - No No - -    Tobacco Social History   Tobacco Use  Smoking Status Former Smoker  . Last attempt to quit: 06/18/1972  . Years since quitting: 45.9  Smokeless Tobacco Never Used     Counseling given: No   Clinical Intake:  Pre-visit preparation completed: Yes  Pain : No/denies pain Pain Score: 0-No pain     Nutritional Status: BMI > 30  Obese Nutritional Risks: None Diabetes: Yes CBG done?: No Did pt. bring in CBG monitor from home?: No  How often do you need to have someone help you when you read instructions, pamphlets, or other written materials from your doctor or pharmacy?: 1 -  Never What is the last grade level you completed in school?: 12th grade  Interpreter Needed?: No  Comments: pt lives with spouse Information entered by :: LPinson, LPN  Past Medical History:  Diagnosis Date  . Angioedema   . Arthritis   . Colon cancer ([place])    colon ca dx 07, colon resection  . Complication of anesthesia   . Diabetes mellitus   . Diverticulitis   . Dysrhythmia    palpitations  . Helicobacter pylori (H. pylori)   . Hypertension   . Malignant neoplasm of colon   . Neuropathy, idiopathic   . PONV (postoperative nausea and vomiting)   . Type II diabetes mellitus without mention of complication    Past Surgical History:  Procedure Laterality Date  . COLON RESECTION  2007   colon cancer  . STERIOD INJECTION Left 08/04/2012   Procedure: STEROID INJECTION;  Surgeon: Gearlean Alf, MD;  Location: [facility];  Service: Orthopedics;  Laterality: Left;  . TONSILLECTOMY    . TOTAL KNEE ARTHROPLASTY Right 08/04/2012   Procedure: TOTAL KNEE ARTHROPLASTY;  Surgeon: Gearlean Alf, MD;  Location: [facility];  Service: Orthopedics;  Laterality: Right;  . TUBAL LIGATION     Family History  Problem Relation Age of Onset  . Lung cancer Mother 35  . Kidney failure Father 23  . Diabetes Sister   . Diabetes Brother   . Brain cancer Daughter    Social History   Socioeconomic History  .  Marital status: Married    Spouse name: Not on file  . Number of children: Not on file  . Years of education: Not on file  . Highest education level: Not on file  Occupational History  . Not on file  Social Needs  . Financial resource strain: Not on file  . Food insecurity:    Worry: Not on file    Inability: Not on file  . Transportation needs:    Medical: Not on file    Non-medical: Not on file  Tobacco Use  . Smoking status: Former Smoker    Last attempt to quit: 06/18/1972    Years since quitting: 45.9  . Smokeless tobacco: Never Used  Substance and Sexual Activity  . Alcohol  use: No  . Drug use: No  . Sexual activity: Not Currently  Lifestyle  . Physical activity:    Days per week: Not on file    Minutes per session: Not on file  . Stress: Not on file  Relationships  . Social connections:    Talks on phone: Not on file    Gets together: Not on file    Attends religious service: Not on file    Active member of club or organization: Not on file    Attends meetings of clubs or organizations: Not on file    Relationship status: Not on file  Other Topics Concern  . Not on file  Social History Narrative   Married.   4 children, 7 grandchildren, 3 great grandchildren.   Retired. Once worked at [employer]	[place].   Enjoys walking, puzzle books, spending time with family.    Outpatient Encounter Medications as of 06/05/2018  Medication Sig  . amLODipine (NORVASC) 10 MG tablet TAKE 1 TABLET BY MOUTH DAILY  . apixaban (ELIQUIS) 5 MG TABS tablet Take 1 tablet (5 mg total) by mouth 2 (two) times daily.  . Calcium Carb-Cholecalciferol (CALCIUM+D3 PO) Take 1 tablet by mouth daily.  Marland Kitchen glipiZIDE (GLUCOTROL) 10 MG tablet TAKE 1 TABLET BY MOUTH TWICE DAILY  . hydrochlorothiazide (HYDRODIURIL) 25 MG tablet TAKE 1 TABLET BY MOUTH DAILY FOR HIGH BLOOD PRESSURE  . metFORMIN (GLUCOPHAGE-XR) 500 MG 24 hr tablet TAKE 2 TABLETS BY MOUTH TWICE DAILY  . metoprolol succinate (TOPROL-XL) 100 MG 24 hr tablet TAKE 1 TABLET BY MOUTH DAILY WITH OR IMMEDIATELY FOLLOWING A MEAL  . Multiple Vitamin (MULTIVITAMIN WITH MINERALS) TABS tablet Take 1 tablet by mouth daily.  . Multiple Vitamins-Minerals (EYE VITAMINS & MINERALS PO) Take 2 capsules by mouth daily.  . ONE TOUCH ULTRA TEST test strip USE AS DIRECTED TO TEST BLOOD SUGAR  . Propylene Glycol (SYSTANE BALANCE) 0.6 % SOLN Place 1 drop into both eyes daily as needed (for dry eyes).  . [DISCONTINUED] ciclopirox (LOPROX) 0.77 % cream Apply topically 2 (two) times daily. (Patient not taking: Reported on 12/09/2017)   No  facility-administered encounter medications on file as of 06/05/2018.     Activities of Daily Living In your present state of health, do you have any difficulty performing the following activities: 06/05/2018  Hearing? N  Vision? N  Difficulty concentrating or making decisions? N  Walking or climbing stairs? Y  Dressing or bathing? N  Doing errands, shopping? N  Preparing Food and eating ? N  Using the Toilet? N  In the past six months, have you accidently leaked urine? N  Do you have problems with loss of bowel control? N  Managing your Medications? N  Managing your  Finances? N  Housekeeping or managing your Housekeeping? N  Some recent data might be hidden    Patient Care Team: Pleas Koch, NP as PCP - General (Internal Medicine) Nahser, Wonda Cheng, MD as PCP - Cardiology (Cardiology) Shirley Muscat Loreen Freud, MD as Referring Physician (Optometry)    Assessment:   This is a routine wellness examination for Carly Buckley.   Hearing Screening   125Hz  250Hz  500Hz  1000Hz  2000Hz  3000Hz  4000Hz  6000Hz  8000Hz   Right ear:   40 40 40  40    Left ear:   40 40 40  40    Vision Screening Comments: Vision exam in Dec 2019 with Dr. Shirley Muscat   Exercise Activities and Dietary recommendations Current Exercise Habits: The patient does not participate in regular exercise at present, Exercise limited by: None identified  Goals    . Follow up with Primary Care Provider     Starting 06/05/2018, I will continue to take medications as prescribed and to keep appointments as prescribed.        Fall Risk Fall Risk  06/05/2018 05/31/2017 05/04/2015 04/21/2014 04/27/2013  Falls in the past year? 0 No No No No   Depression Screen PHQ 2/9 Scores 06/05/2018 05/31/2017 05/08/2016 05/04/2015  PHQ - 2 Score 0 0 0 0  PHQ- 9 Score 0 0 - -     Cognitive Function MMSE - Mini Mental State Exam 06/05/2018 05/31/2017  Orientation to time 5 5  Orientation to Place 5 5  Registration 3 3  Attention/ Calculation 0 0    Recall 3 3  Language- name 2 objects 0 0  Language- repeat 1 1  Language- follow 3 step command 0 1  Language- follow 3 step command-comments Refused to complete clock drawing unable to follow 1 step of 3 step command; 3 attempts to complete clock  Language- read & follow direction 0 0  Write a sentence 0 0  Copy design 0 0  Total score 17 18       PLEASE NOTE: A Mini-Cog screen was completed. Maximum score is 20. A value of 0 denotes this part of [place] MMSE was not completed or the patient failed this part of the Mini-Cog screening.   Mini-Cog Screening Orientation to Time - Max 5 pts Orientation to Place - Max 5 pts Registration - Max 3 pts Recall - Max 3 pts Language Repeat - Max 1 pts Language Follow 3 Step Command - Max 3 pts   Immunization History  Administered Date(s) Administered  . Influenza Split 03/29/2009  . Influenza,inj,Quad PF,6+ Mos 06/05/2018  . Pneumococcal Conjugate-13 11/30/2014  . Pneumococcal Polysaccharide-23 05/08/2016  . Td 04/16/2006    Screening Tests Health Maintenance  Topic Date Due  . TETANUS/TDAP  04/15/2020 (Originally 04/16/2016)  . FOOT EXAM  06/07/2018  . MAMMOGRAM  08/10/2018  . HEMOGLOBIN A1C  12/04/2018  . OPHTHALMOLOGY EXAM  04/05/2019  . COLONOSCOPY  07/11/2023  . INFLUENZA VACCINE  Completed  . DEXA SCAN  Completed  . Hepatitis C Screening  Completed  . PNA vac Low Risk Adult  Completed      Plan:     I have personally reviewed, addressed, and noted the following in the patient's chart:  A. Medical and social history B. Use of alcohol, tobacco or illicit drugs  C. Current medications and supplements D. Functional ability and status E.  Nutritional status F.  Physical activity G. Advance directives H. List of other physicians I.  Hospitalizations, surgeries, and ER visits in previous  12 months J.  [place] to include hearing, vision, cognitive, depression L. Referrals and appointments - none  In  addition, I have reviewed and discussed with patient certain preventive protocols, quality metrics, and best practice recommendations. A written personalized care plan for preventive services as well as general preventive health recommendations were provided to patient.  See attached scanned questionnaire for additional information.   Signed,   Lindell Noe, MHA, BS, LPN Health Coach

## 2018-06-05 NOTE — Patient Instructions (Signed)
Ms. Ikeda , Thank you for taking time to come for your Medicare Wellness Visit. I appreciate your ongoing commitment to your health goals. Please review the following plan we discussed and let me know if I can assist you in the future.   These are the goals we discussed: Goals    . Follow up with Primary Care Provider     Starting 06/05/2018, I will continue to take medications as prescribed and to keep appointments as prescribed.        This is a list of the screening recommended for you and due dates:  Health Maintenance  Topic Date Due  . Tetanus Vaccine  04/15/2020*  . Complete foot exam   06/07/2018  . Mammogram  08/10/2018  . Hemoglobin A1C  12/04/2018  . Eye exam for diabetics  04/05/2019  . Colon Cancer Screening  07/11/2023  . Flu Shot  Completed  . DEXA scan (bone density measurement)  Completed  .  Hepatitis C: One time screening is recommended by Center for Disease Control  (CDC) for  adults born from [birth year] through 1965.   Completed  . Pneumonia vaccines  Completed  *Topic was postponed. The date shown is not the original due date.   Preventive Care for Adults  A healthy lifestyle and preventive care can promote health and wellness. Preventive health guidelines for adults include the following key practices.  . A routine yearly physical is a good way to check with your health care provider about your health and preventive screening. It is a chance to share any concerns and updates on your health and to receive a thorough exam.  . Visit your dentist for a routine exam and preventive care every 6 months. Brush your teeth twice a day and floss once a day. Good oral hygiene prevents tooth decay and gum disease.  . The frequency of eye exams is based on your age, health, family medical history, use  of contact lenses, and other factors. Follow your health care provider's recommendations for frequency of eye exams.  . Eat a healthy diet. Foods like vegetables, fruits, whole  grains, low-fat dairy products, and lean protein foods contain the nutrients you need without too many calories. Decrease your intake of foods high in solid fats, added sugars, and salt. Eat the right amount of calories for you. Get information about a proper diet from your health care provider, if necessary.  . Regular physical exercise is one of the most important things you can do for your health. Most adults should get at least 150 minutes of moderate-intensity exercise (any activity that increases your heart rate and causes you to sweat) each week. In addition, most adults need muscle-strengthening exercises on 2 or more days a week.  Silver Sneakers may be a benefit available to you. To determine eligibility, you may visit the website: www.silversneakers.com or contact program at 804-561-0566 Mon-Fri between 8AM-8PM.   . Maintain a healthy weight. The body mass index (BMI) is a screening tool to identify possible weight problems. It provides an estimate of body fat based on height and weight. Your health care provider can find your BMI and can help you achieve or maintain a healthy weight.   For adults 20 years and older: ? A BMI below 18.5 is considered underweight. ? A BMI of 18.5 to 24.9 is normal. ? A BMI of 25 to 29.9 is considered overweight. ? A BMI of 30 and above is considered obese.   . Maintain normal blood  lipids and cholesterol levels by exercising and minimizing your intake of saturated fat. Eat a balanced diet with plenty of fruit and vegetables. Blood tests for lipids and cholesterol should begin at age 25 and be repeated every 5 years. If your lipid or cholesterol levels are high, you are over 50, or you are at high risk for heart disease, you may need your cholesterol levels checked more frequently. Ongoing high lipid and cholesterol levels should be treated with medicines if diet and exercise are not working.  . If you smoke, find out from your health care provider how to  quit. If you do not use tobacco, please do not start.  . If you choose to drink alcohol, please do not consume more than 2 drinks per day. One drink is considered to be 12 ounces (355 mL) of beer, 5 ounces (148 mL) of wine, or 1.5 ounces (44 mL) of liquor.  . If you are 32-18 years old, ask your health care provider if you should take aspirin to prevent strokes.  . Use sunscreen. Apply sunscreen liberally and repeatedly throughout the day. You should seek shade when your shadow is shorter than you. Protect yourself by wearing long sleeves, pants, a wide-brimmed hat, and sunglasses year round, whenever you are outdoors.  . Once a month, do a whole body skin exam, using a mirror to look at the skin on your back. Tell your health care provider of new moles, moles that have irregular borders, moles that are larger than a pencil eraser, or moles that have changed in shape or color.

## 2018-06-06 ENCOUNTER — Ambulatory Visit: Payer: Medicare Other

## 2018-06-06 NOTE — [facility] Notes (Signed)
I reviewed health advisor's note, was available for consultation, and agree with documentation and plan.  

## 2018-06-13 ENCOUNTER — Ambulatory Visit (INDEPENDENT_AMBULATORY_CARE_PROVIDER_SITE_OTHER)
Admission: RE | Admit: 2018-06-13 | Discharge: 2018-06-13 | Disposition: A | Discharge status: Home / Self Care | Payer: Medicare Other | Source: Ambulatory Visit | Attending: Primary Care | Admitting: Primary Care

## 2018-06-13 ENCOUNTER — Telehealth: Payer: Self-pay | Admitting: Primary Care

## 2018-06-13 ENCOUNTER — Ambulatory Visit (INDEPENDENT_AMBULATORY_CARE_PROVIDER_SITE_OTHER): Payer: Medicare Other | Admitting: Primary Care

## 2018-06-13 VITALS — BP 126/72 | HR 76 | Temp 98.0°F | Ht 65.5 in | Wt 196.0 lb

## 2018-06-13 DIAGNOSIS — Z0000 Encounter for general adult medical examination without abnormal findings: Secondary | ICD-10-CM

## 2018-06-13 DIAGNOSIS — I480 Paroxysmal atrial fibrillation: Secondary | ICD-10-CM | POA: Diagnosis not present

## 2018-06-13 DIAGNOSIS — M542 Cervicalgia: Secondary | ICD-10-CM | POA: Diagnosis not present

## 2018-06-13 DIAGNOSIS — Z1239 Encounter for other screening for malignant neoplasm of breast: Secondary | ICD-10-CM | POA: Diagnosis not present

## 2018-06-13 DIAGNOSIS — Z1211 Encounter for screening for malignant neoplasm of colon: Secondary | ICD-10-CM | POA: Diagnosis not present

## 2018-06-13 DIAGNOSIS — E785 Hyperlipidemia, unspecified: Secondary | ICD-10-CM

## 2018-06-13 DIAGNOSIS — C189 Malignant neoplasm of colon, unspecified: Secondary | ICD-10-CM

## 2018-06-13 DIAGNOSIS — E1140 Type 2 diabetes mellitus with diabetic neuropathy, unspecified: Secondary | ICD-10-CM

## 2018-06-13 DIAGNOSIS — I10 Essential (primary) hypertension: Secondary | ICD-10-CM | POA: Diagnosis not present

## 2018-06-13 HISTORY — DX: Cervicalgia: M54.2

## 2018-06-13 MED ORDER — SITAGLIPTIN PHOSPHATE 100 MG PO TABS: 100.0000 mg | ORAL_TABLET | Freq: Every day | ORAL | 3 refills | Status: DC

## 2018-06-13 MED ORDER — LINAGLIPTIN 5 MG PO TABS: 5.0000 mg | ORAL_TABLET | Freq: Every day | ORAL | 3 refills | Status: DC

## 2018-06-13 NOTE — Assessment & Plan Note (Signed)
Rate and rhythm regular today. Continue Eliquis and metoprolol succinate.

## 2018-06-13 NOTE — Telephone Encounter (Signed)
Please notify patient that I sent in a new prescription for a medication called linagliptin 5 mg.  Take 1 tablet once daily.  Have her call her insurance company and ask for medications that they will cover for diabetes if this one is too expensive.

## 2018-06-13 NOTE — Telephone Encounter (Signed)
Carly Buckley, how about the cholesterol medication

## 2018-06-13 NOTE — Assessment & Plan Note (Signed)
Due for repeat colonoscopy this year, referral placed to GI.

## 2018-06-13 NOTE — Assessment & Plan Note (Signed)
Uncontrolled and has been uncontrolled for years. Long discussion today regarding our next step, she is maxed out on Glipizide and Metformin. She declines insulin. Will add in Januvia 100 mg, she will update if the medication is too costly.  Declines statin treatment despite strong recommendations. Urine microalbumin due next visit. Pneumonia vaccination UTD.  Repeat A1C in 3 months, follow up in 6 months.

## 2018-06-13 NOTE — Assessment & Plan Note (Signed)
Chronic and intermittent over the last several months. Exam today benign. Suspect more arthritis flare. Check plain films today. Discussed heat/stretching/tylenol.

## 2018-06-13 NOTE — Assessment & Plan Note (Signed)
Stable in the office today, continue Amlodipine, metoprolol succinate, HCTZ. BMP reviewed and is stable.

## 2018-06-13 NOTE — Telephone Encounter (Signed)
Spoken to patient and received fax from [facility].  Januvia 100 mg is too expensive for patient. Please sent alternative per patient request.  Also she stated that she change her mind and willing to take cholesterol medication.

## 2018-06-13 NOTE — Patient Instructions (Signed)
Start sitagliptan (Januvia) 100 mg for diabetes. Take 1 tablet once daily. Continue metformin and glipizide for diabetes.  Continue exercising. You should be getting 150 minutes of moderate intensity exercise weekly.  It is important that you improve your diet. Please limit carbohydrates in the form of white bread, rice, pasta, sweets, fast food, fried food, sugary drinks, etc. Increase your consumption of fresh fruits and vegetables, whole grains, lean protein.  Ensure you are consuming 64 ounces of water daily.  I highly recommend you start a medication called atorvastatin for cholesterol and to prevent heart disease/stroke. Please call me if you change your mind.  Complete xray(s) prior to leaving today. I will notify you of your results once received.  Schedule a lab only appointment for 3 months to check your diabetes.  Please schedule a follow up appointment in 6 months for diabetes check.   It was a pleasure to see you today!   Preventive Care 73 Years and Older, Female Preventive care refers to lifestyle choices and visits with your health care provider that can promote health and wellness. What does preventive care include?  A yearly physical exam. This is also called an annual well check.  Dental exams once or twice a year.  Routine eye exams. Ask your health care provider how often you should have your eyes checked.  Personal lifestyle choices, including: ? Daily care of your teeth and gums. ? Regular physical activity. ? Eating a healthy diet. ? Avoiding tobacco and drug use. ? Limiting alcohol use. ? Practicing safe sex. ? Taking low-dose aspirin every day. ? Taking vitamin and mineral supplements as recommended by your health care provider. What happens during an annual well check? The services and screenings done by your health care provider during your annual well check will depend on your age, overall health, lifestyle risk factors, and family history of  disease. Counseling Your health care provider may ask you questions about your:  Alcohol use.  Tobacco use.  Drug use.  Emotional well-being.  Home and relationship well-being.  Sexual activity.  Eating habits.  History of falls.  Memory and ability to understand (cognition).  Work and work Statistician.  Reproductive health.  Screening You may have the following tests or measurements:  Height, weight, and BMI.  Blood pressure.  Lipid and cholesterol levels. These may be checked every 5 years, or more frequently if you are over 68 years old.  Skin check.  Lung cancer screening. You may have this screening every year starting at age 20 if you have a 30-pack-year history of smoking and currently smoke or have quit within the past 15 years.  Colorectal cancer screening. All adults should have this screening starting at age 78 and continuing until age 40. You will have tests every 1-10 years, depending on your results and the type of screening test. People at increased risk should start screening at an earlier age. Screening tests may include: ? Guaiac-based fecal occult blood testing. ? Fecal immunochemical test (FIT). ? Stool DNA test. ? Virtual colonoscopy. ? Sigmoidoscopy. During this test, a flexible tube with a tiny camera (sigmoidoscope) is used to examine your rectum and lower colon. The sigmoidoscope is inserted through your anus into your rectum and lower colon. ? Colonoscopy. During this test, a long, thin, flexible tube with a tiny camera (colonoscope) is used to examine your entire colon and rectum.  Hepatitis C blood test.  Hepatitis B blood test.  Sexually transmitted disease (STD) testing.  Diabetes screening.  This is done by checking your blood sugar (glucose) after you have not eaten for a while (fasting). You may have this done every 1-3 years.  Bone density scan. This is done to screen for osteoporosis. You may have this done starting at age  68.  Mammogram. This may be done every 1-2 years. Talk to your health care provider about how often you should have regular mammograms. Talk with your health care provider about your test results, treatment options, and if necessary, the need for more tests. Vaccines Your health care provider may recommend certain vaccines, such as:  Influenza vaccine. This is recommended every year.  Tetanus, diphtheria, and acellular pertussis (Tdap, Td) vaccine. You may need a Td booster every 10 years.  Varicella vaccine. You may need this if you have not been vaccinated.  Zoster vaccine. You may need this after age 47.  Measles, mumps, and rubella (MMR) vaccine. You may need at least one dose of MMR if you were born in 1957 or later. You may also need a second dose.  Pneumococcal 13-valent conjugate (PCV13) vaccine. One dose is recommended after age 84.  Pneumococcal polysaccharide (PPSV23) vaccine. One dose is recommended after age 79.  Meningococcal vaccine. You may need this if you have certain conditions.  Hepatitis A vaccine. You may need this if you have certain conditions or if you travel or work in places where you may be exposed to hepatitis A.  Hepatitis B vaccine. You may need this if you have certain conditions or if you travel or work in places where you may be exposed to hepatitis B.  Haemophilus influenzae type b (Hib) vaccine. You may need this if you have certain conditions. Talk to your health care provider about which screenings and vaccines you need and how often you need them. This information is not intended to replace advice given to you by your health care provider. Make sure you discuss any questions you have with your health care provider. Document Released: 04/29/2015 Document Revised: 05/23/2017 Document Reviewed: 02/01/2015 Elsevier Interactive Patient Education  2019 [employer].

## 2018-06-13 NOTE — Progress Notes (Signed)
Subjective:    Patient ID: Carly Buckley, female    DOB: 12/10/1945, 73 y.o.   MRN: [id number]  HPI  Carly Buckley is a 73 year old female who presents today for complete physical.  Immunizations: -Influenza: Completed this season  -Pneumonia: Completed in 2018 -Shingles: Never completed, declines  Diet: She endorses a fair diet Breakfast: Grits, eggs, oatmeal Lunch: Salad with protein, sandwich, restaurants  Dinner: Frozen meal, salad, restaurants  Snacks: Fruit, crackers Desserts: None Beverages: Water, diet soda, coffee, sugar free tea  Exercise: She is walking several days weekly at her church, tries to walk one mile at a time.  Eye exam: Completed in December 2019 Dental exam: No recent exam Colonoscopy: Completed in 2015, due in 2020 Dexa: Completed in 2019, mild osteopenia, taking calcium with vitamin D. Mammogram: Completed in 2019 Hep C Screen: Negative in 2019  BP Readings from Last 3 Encounters:  06/13/18 126/72  06/05/18 (!) 142/76  12/09/17 132/70   The 10-year ASCVD risk score Carly Buckley., et al., 2013) is: 26.3%   Values used to calculate the score:     Age: 62 years     Sex: Female     Is Non-Hispanic African American: No     Diabetic: Yes     Tobacco smoker: No     Systolic Blood Pressure: 628 mmHg     Is BP treated: Yes     HDL Cholesterol: 61.4 mg/dL     Total Cholesterol: 180 mg/dL   Review of Systems  Constitutional: Negative for unexpected weight change.  HENT: Negative for rhinorrhea.   Respiratory: Negative for cough and shortness of breath.   Cardiovascular: Negative for chest pain.  Gastrointestinal: Negative for constipation and diarrhea.  Genitourinary: Negative for difficulty urinating.  Musculoskeletal: Positive for arthralgias.       Intermittent neck pain  Skin: Negative for rash.  Allergic/Immunologic: Negative for environmental allergies.  Neurological: Negative for dizziness, numbness and headaches.  Psychiatric/Behavioral:         Some anxiety       Past Medical History:  Diagnosis Date  . Angioedema   . Arthritis   . Colon cancer ([place])    colon ca dx 07, colon resection  . Complication of anesthesia   . Diabetes mellitus   . Diverticulitis   . Dysrhythmia    palpitations  . Helicobacter pylori (H. pylori)   . Hypertension   . Malignant neoplasm of colon   . Neuropathy, idiopathic   . PONV (postoperative nausea and vomiting)   . Type II diabetes mellitus without mention of complication      Social History   Socioeconomic History  . Marital status: Married    Spouse name: Not on file  . Number of children: Not on file  . Years of education: Not on file  . Highest education level: Not on file  Occupational History  . Not on file  Social Needs  . Financial resource strain: Not on file  . Food insecurity:    Worry: Not on file    Inability: Not on file  . Transportation needs:    Medical: Not on file    Non-medical: Not on file  Tobacco Use  . Smoking status: Former Smoker    Last attempt to quit: 06/18/1972    Years since quitting: 46.0  . Smokeless tobacco: Never Used  Substance and Sexual Activity  . Alcohol use: No  . Drug use: No  . Sexual activity: Not  Currently  Lifestyle  . Physical activity:    Days per week: Not on file    Minutes per session: Not on file  . Stress: Not on file  Relationships  . Social connections:    Talks on phone: Not on file    Gets together: Not on file    Attends religious service: Not on file    Active member of club or organization: Not on file    Attends meetings of clubs or organizations: Not on file    Relationship status: Not on file  . Intimate partner violence:    Fear of current or ex partner: Not on file    Emotionally abused: Not on file    Physically abused: Not on file    Forced sexual activity: Not on file  Other Topics Concern  . Not on file  Social History Narrative   Married.   4 children, 7 grandchildren, 3 great  grandchildren.   Retired. Once worked at [employer]	[place].   Enjoys walking, puzzle books, spending time with family.    Past Surgical History:  Procedure Laterality Date  . COLON RESECTION  2007   colon cancer  . STERIOD INJECTION Left 08/04/2012   Procedure: STEROID INJECTION;  Surgeon: Carly Alf, MD;  Location: [facility];  Service: Orthopedics;  Laterality: Left;  . TONSILLECTOMY    . TOTAL KNEE ARTHROPLASTY Right 08/04/2012   Procedure: TOTAL KNEE ARTHROPLASTY;  Surgeon: Carly Alf, MD;  Location: [facility];  Service: Orthopedics;  Laterality: Right;  . TUBAL LIGATION      Family History  Problem Relation Age of Onset  . Lung cancer Mother 28  . Kidney failure Father 80  . Diabetes Sister   . Diabetes Brother   . Brain cancer Daughter     Allergies  Allergen Reactions  . Lisinopril Swelling  . Morphine And Related     Nauseated   . Oxycodone Nausea Only    Current Outpatient Medications on File Prior to Visit  Medication Sig Dispense Refill  . amLODipine (NORVASC) 10 MG tablet TAKE 1 TABLET BY MOUTH DAILY 90 tablet 1  . apixaban (ELIQUIS) 5 MG TABS tablet Take 1 tablet (5 mg total) by mouth 2 (two) times daily. 60 tablet 11  . Calcium Carb-Cholecalciferol (CALCIUM+D3 PO) Take 1 tablet by mouth daily.    Marland Kitchen glipiZIDE (GLUCOTROL) 10 MG tablet TAKE 1 TABLET BY MOUTH TWICE DAILY 180 tablet 1  . hydrochlorothiazide (HYDRODIURIL) 25 MG tablet TAKE 1 TABLET BY MOUTH DAILY FOR HIGH BLOOD PRESSURE 90 tablet 1  . metFORMIN (GLUCOPHAGE-XR) 500 MG 24 hr tablet TAKE 2 TABLETS BY MOUTH TWICE DAILY 360 tablet 1  . metoprolol succinate (TOPROL-XL) 100 MG 24 hr tablet TAKE 1 TABLET BY MOUTH DAILY WITH OR IMMEDIATELY FOLLOWING A MEAL 90 tablet 0  . Multiple Vitamin (MULTIVITAMIN WITH MINERALS) TABS tablet Take 1 tablet by mouth daily.    . Multiple Vitamins-Minerals (EYE VITAMINS & MINERALS PO) Take 2 capsules by mouth daily.    . ONE TOUCH ULTRA TEST test strip USE AS DIRECTED  TO TEST BLOOD SUGAR 100 each 2  . Propylene Glycol (SYSTANE BALANCE) 0.6 % SOLN Place 1 drop into both eyes daily as needed (for dry eyes).     No current facility-administered medications on file prior to visit.     BP 126/72   Pulse 76   Temp 98 F (36.7 C) (Oral)   Ht 5' 5.5" (1.664 m)   [facility]  196 lb (88.9 kg)   SpO2 95%   BMI 32.12 kg/m    Objective:   Physical Exam  Constitutional: She is oriented to person, place, and time. She appears well-nourished.  HENT:  Mouth/Throat: No oropharyngeal exudate.  Eyes: Pupils are equal, round, and reactive to light. EOM are normal.  Neck: Normal range of motion. Neck supple. No spinous process tenderness and no muscular tenderness present. No thyromegaly present.  Cardiovascular: Normal rate and regular rhythm.  Respiratory: Effort normal and breath sounds normal.  GI: Soft. Bowel sounds are normal. There is no abdominal tenderness.  Musculoskeletal: Normal range of motion.  Neurological: She is alert and oriented to person, place, and time.  Skin: Skin is warm and dry.  Psychiatric: She has a normal mood and affect.           Assessment & Plan:

## 2018-06-13 NOTE — Assessment & Plan Note (Signed)
Declines Shingrix. Influenza vaccination UTD. Mammogram due in April 2020, orders placed. Bone density UTD. Compliant to calcium and vitmain D. Colonoscopy due next month, referral placed to GI. Exam unremarkable. Labs reviewed. Follow up in 1 year for CPE.

## 2018-06-15 MED ORDER — ATORVASTATIN CALCIUM 20 MG PO TABS
20.0000 mg | ORAL_TABLET | Freq: Every day | ORAL | 3 refills | Status: DC
Start: 2018-06-15 — End: 2019-05-15

## 2018-06-15 NOTE — Telephone Encounter (Signed)
Rx for atorvastatin sent to pharmacy

## 2018-07-16 ENCOUNTER — Telehealth: Payer: Self-pay | Admitting: Nurse Practitioner

## 2018-07-16 NOTE — Telephone Encounter (Signed)

## 2018-07-22 ENCOUNTER — Telehealth: Payer: Self-pay | Admitting: Cardiovascular Disease

## 2018-07-22 NOTE — Telephone Encounter (Signed)
Spoke with patient regarding upcoming appointment.  She said that she prefers her visit to be by phone.  She does not want to download webex or MyChart onto her phone.  She does not have a computer.  Confirmed all demographics.

## 2018-07-27 NOTE — Progress Notes (Signed)
Virtual Visit via Telephone Note   This visit type was conducted due to national recommendations for restrictions regarding the COVID-19 Pandemic (e.g. social distancing) in an effort to limit this patient's exposure and mitigate transmission in our community.  Due to her co-morbid illnesses, this patient is at least at moderate risk for complications without adequate follow up.  This format is felt to be most appropriate for this patient at this time.  The patient did not have access to video technology/had technical difficulties with video requiring transitioning to audio format only (telephone).  All issues noted in this document were discussed and addressed.  No physical exam could be performed with this format.  Please refer to the patient's chart for her  consent to telehealth for Carly Buckley.   Evaluation Performed:  Follow-up visit  Date:  07/28/2018   ID:  Carly Buckley, Carly Buckley February 19, 1946, MRN [id number]  Patient Location: Home  Provider Location: Office  PCP:  Pleas Koch, NP  Cardiologist:  Mertie Moores, MD  Electrophysiologist:  None   Chief Complaint:  Carly Buckley   History of Present Illness:    [name] is a 73 y.o. female who presents via audio/video conferencing for a telehealth visit today.    Is a 73 year old female with a history of paroxysmal atrial fibrillation.  I seen her for several years for follow-up of this.  She also has a history of diabetes mellitus, colon cancer  Staying healthy No cough, cold symptoms, No fever   No CP Has occasional heart palpitations.  Not associated with dizziness or cp or dyspnea.  Still able to get out and do normal activities.   No chnages in health over the past year   The patient does not have symptoms concerning for COVID-19 infection (fever, chills, cough, or new shortness of breath).    Past Medical History:  Diagnosis Date  . Angioedema   . Arthritis   . Colon cancer ([place])    colon ca dx 07,  colon resection  . Complication of anesthesia   . Diabetes mellitus   . Diverticulitis   . Dysrhythmia    palpitations  . Helicobacter pylori (H. pylori)   . Hypertension   . Malignant neoplasm of colon   . Neuropathy, idiopathic   . PONV (postoperative nausea and vomiting)   . Type II diabetes mellitus without mention of complication    Past Surgical History:  Procedure Laterality Date  . COLON RESECTION  2007   colon cancer  . STERIOD INJECTION Left 08/04/2012   Procedure: STEROID INJECTION;  Surgeon: Gearlean Alf, MD;  Location: [facility];  Service: Orthopedics;  Laterality: Left;  . TONSILLECTOMY    . TOTAL KNEE ARTHROPLASTY Right 08/04/2012   Procedure: TOTAL KNEE ARTHROPLASTY;  Surgeon: Gearlean Alf, MD;  Location: [facility];  Service: Orthopedics;  Laterality: Right;  . TUBAL LIGATION       Current Meds  Medication Sig  . amLODipine (NORVASC) 10 MG tablet TAKE 1 TABLET BY MOUTH DAILY  . apixaban (ELIQUIS) 5 MG TABS tablet Take 1 tablet (5 mg total) by mouth 2 (two) times daily.  Marland Kitchen atorvastatin (LIPITOR) 20 MG tablet Take 1 tablet (20 mg total) by mouth daily. For cholesterol.  . Calcium Carb-Cholecalciferol (CALCIUM+D3 PO) Take 1 tablet by mouth daily.  Marland Kitchen glipiZIDE (GLUCOTROL) 10 MG tablet TAKE 1 TABLET BY MOUTH TWICE DAILY  . hydrochlorothiazide (HYDRODIURIL) 25 MG tablet TAKE 1 TABLET BY MOUTH DAILY FOR HIGH BLOOD  PRESSURE  . linagliptin (TRADJENTA) 5 MG TABS tablet Take 1 tablet (5 mg total) by mouth daily. For diabetes.  . metFORMIN (GLUCOPHAGE-XR) 500 MG 24 hr tablet TAKE 2 TABLETS BY MOUTH TWICE DAILY  . metoprolol succinate (TOPROL-XL) 100 MG 24 hr tablet TAKE 1 TABLET BY MOUTH DAILY WITH OR IMMEDIATELY FOLLOWING A MEAL  . Multiple Vitamin (MULTIVITAMIN WITH MINERALS) TABS tablet Take 1 tablet by mouth daily.  . Multiple Vitamins-Minerals (EYE VITAMINS & MINERALS PO) Take 2 capsules by mouth daily.  . ONE TOUCH ULTRA TEST test strip USE AS DIRECTED TO TEST BLOOD  SUGAR  . Propylene Glycol (SYSTANE BALANCE) 0.6 % SOLN Place 1 drop into both eyes daily as needed (for dry eyes).     Allergies:   Lisinopril; Morphine and related; and Oxycodone   Social History   Tobacco Use  . Smoking status: Former Smoker    Last attempt to quit: 06/18/1972    Years since quitting: 46.1  . Smokeless tobacco: Never Used  Substance Use Topics  . Alcohol use: No  . Drug use: No     Family Hx: The patient's family history includes Brain cancer in her daughter; Diabetes in her brother and sister; Kidney failure (age of onset: 53) in her father; Lung cancer (age of onset: 27) in her mother.  ROS:   Please see the history of present illness.     All other systems reviewed and are negative.   Prior CV studies:   The following studies were reviewed today:    Labs/Other Tests and Data Reviewed:    EKG:  An ECG dated 12/14/16 was personally reviewed today and demonstrated:    Recent Labs: 06/05/2018: ALT 18; BUN 21; Creatinine, Ser 0.71; Potassium 3.6; Sodium 135   Recent Lipid Panel Lab Results  Component Value Date/Time   CHOL 180 06/05/2018 10:54 AM   TRIG 95.0 06/05/2018 10:54 AM   HDL 61.40 06/05/2018 10:54 AM   CHOLHDL 3 06/05/2018 10:54 AM   LDLCALC 99 06/05/2018 10:54 AM    Wt Readings from Last 3 Encounters:  07/28/18 193 lb (87.5 kg)  06/13/18 196 lb (88.9 kg)  06/05/18 196 lb 11.2 oz (89.2 kg)     Objective:    Vital Signs:  BP 132/64 (BP Location: Left Arm, Patient Position: Sitting, Cuff Size: Normal)   Pulse 88   Ht 5' 5.5" (1.664 m)   Wt 193 lb (87.5 kg)   BMI 31.63 kg/m      ASSESSMENT & PLAN:    1. History of paroxysmal atrial fibrillation: Latrisa seems to be doing well.  She is having very brief episodes of palpitations but these do not sound like atrial fib.  Continue Eliquis.  Continue metoprolol XL.  2.  Hypertension: continue current meds  BP is well controlled.   3.  Diabetes mellitus: Further plans per her primary  medical doctor.  4.  Hyperlipidemia: She had labs checked in February, 2020.  Labs look stable.  COVID-19 Education: The signs and symptoms of COVID-19 were discussed with the patient and how to seek care for testing (follow up with PCP or arrange E-visit).  The importance of social distancing was discussed today.  Time:   Today, I have spent 17  minutes with the patient with telehealth technology discussing the above problems.     Medication Adjustments/Labs and Tests Ordered: Current medicines are reviewed at length with the patient today.  Concerns regarding medicines are outlined above.  Tests Ordered: No orders  of the defined types were placed in this encounter.  Medication Changes: No orders of the defined types were placed in this encounter.   Disposition:  Follow up in 1 year(s)  Signed, Mertie Moores, MD  07/28/2018 10:45 AM    [facility]

## 2018-07-28 ENCOUNTER — Telehealth (INDEPENDENT_AMBULATORY_CARE_PROVIDER_SITE_OTHER): Payer: Medicare Other | Admitting: Cardiovascular Disease

## 2018-07-28 ENCOUNTER — Encounter: Payer: Self-pay | Admitting: Cardiovascular Disease

## 2018-07-28 ENCOUNTER — Other Ambulatory Visit: Payer: Self-pay

## 2018-07-28 VITALS — BP 132/64 | HR 88 | Ht 65.5 in | Wt 193.0 lb

## 2018-07-28 DIAGNOSIS — I10 Essential (primary) hypertension: Secondary | ICD-10-CM | POA: Diagnosis not present

## 2018-07-28 DIAGNOSIS — Z7189 Other specified counseling: Secondary | ICD-10-CM

## 2018-07-28 DIAGNOSIS — I480 Paroxysmal atrial fibrillation: Secondary | ICD-10-CM | POA: Diagnosis not present

## 2018-07-28 NOTE — Patient Instructions (Signed)

## 2018-07-29 ENCOUNTER — Ambulatory Visit: Payer: Medicare Other | Admitting: Cardiovascular Disease

## 2018-07-31 ENCOUNTER — Ambulatory Visit: Payer: Medicare Other | Admitting: Cardiovascular Disease

## 2018-08-07 ENCOUNTER — Other Ambulatory Visit: Payer: Self-pay | Admitting: Cardiovascular Disease

## 2018-08-07 ENCOUNTER — Other Ambulatory Visit: Payer: Self-pay | Admitting: Primary Care

## 2018-08-07 DIAGNOSIS — E1140 Type 2 diabetes mellitus with diabetic neuropathy, unspecified: Secondary | ICD-10-CM

## 2018-08-11 ENCOUNTER — Other Ambulatory Visit: Payer: Self-pay | Admitting: Cardiovascular Disease

## 2018-08-11 ENCOUNTER — Other Ambulatory Visit: Payer: Self-pay | Admitting: Primary Care

## 2018-08-11 DIAGNOSIS — I10 Essential (primary) hypertension: Secondary | ICD-10-CM

## 2018-08-12 NOTE — Telephone Encounter (Signed)
Age 74, weight 87.5kg, SCr 0.71 on 06/05/18, appropriate for refill

## 2018-09-04 ENCOUNTER — Other Ambulatory Visit: Payer: Self-pay | Admitting: Primary Care

## 2018-09-04 DIAGNOSIS — E1140 Type 2 diabetes mellitus with diabetic neuropathy, unspecified: Secondary | ICD-10-CM

## 2018-09-09 ENCOUNTER — Telehealth: Payer: Self-pay

## 2018-09-09 NOTE — Telephone Encounter (Signed)
Left detailed VM w COVID screen and curbside info   

## 2018-09-11 ENCOUNTER — Other Ambulatory Visit (INDEPENDENT_AMBULATORY_CARE_PROVIDER_SITE_OTHER): Payer: Medicare Other

## 2018-09-11 DIAGNOSIS — E1140 Type 2 diabetes mellitus with diabetic neuropathy, unspecified: Secondary | ICD-10-CM

## 2018-09-11 LAB — POCT GLYCOSYLATED HEMOGLOBIN (HGB A1C): Hemoglobin A1C: 7.5 % — AB (ref 4.0–5.6)

## 2018-09-20 ENCOUNTER — Other Ambulatory Visit: Payer: Self-pay | Admitting: Primary Care

## 2018-09-20 DIAGNOSIS — E1140 Type 2 diabetes mellitus with diabetic neuropathy, unspecified: Secondary | ICD-10-CM

## 2018-11-13 ENCOUNTER — Telehealth: Payer: Self-pay | Admitting: Cardiovascular Disease

## 2018-11-13 NOTE — Telephone Encounter (Signed)
   [facility] Pre-operative Risk Assessment    Request for surgical clearance:  1. What type of surgery is being performed? Colonoscopy   2. When is this surgery scheduled?  01/13/2019   3. What type of clearance is required (medical clearance vs. Pharmacy clearance to hold med vs. Both)?  Both  4. Are there any medications that need to be held prior to surgery and how long? Eliquis   5. Practice name and name of physician performing surgery? [facility]- Dr. Watt Climes   6. What is your office phone number? [phone]    7.   What is your office fax number? [phone]  8.   Anesthesia type (None, local, MAC, general) ? Not listed  _________________________________________________________________   (provider comments below)

## 2018-11-14 NOTE — Telephone Encounter (Signed)
Chart reviewed. 73 y/o female with atrial fibrillation followed by Dr. Acie Fredrickson, needing clearance to undergo colonoscopy and hold Eliquis. LVMTCB to assess symptoms. If stable on return call, she can be cleared.

## 2018-11-14 NOTE — Telephone Encounter (Signed)
Pt takes Eliquis for afib with CHADS2VASc score of 4 (age, sex, HTN, DM). Renal function is normal. Ok to hold Eliquis for 1-2 days prior to procedure.

## 2018-11-17 ENCOUNTER — Encounter: Payer: Self-pay | Admitting: Cardiology

## 2018-11-17 NOTE — Telephone Encounter (Signed)
   Primary Cardiologist: Mertie Moores, MD  Chart reviewed as part of pre-operative protocol coverage. Patient was contacted 11/17/2018 in reference to pre-operative risk assessment for pending surgery as outlined below.  DANAIYA STEADMAN was last seen on telehealth 07/28/18 by Dr. Acie Fredrickson.  Since that day, LUDEAN DUHART has done well without chest pain.  Her RCRI risk is 0.4% of major cardiac event with procedure.  Therefore, based on ACC/AHA guidelines, the patient would be at acceptable risk for the planned procedure without further cardiovascular testing.   She may hold eliquis 1-2 days prior to procedure and resume post procedure when safe.   I will route this recommendation to the requesting party via Epic fax function and remove from pre-op pool.  Please call with questions.  Cecilie Kicks, NP 11/17/2018, 1:28 PM

## 2018-12-12 ENCOUNTER — Encounter: Payer: Self-pay | Admitting: Primary Care

## 2018-12-12 ENCOUNTER — Ambulatory Visit (INDEPENDENT_AMBULATORY_CARE_PROVIDER_SITE_OTHER): Payer: Medicare Other | Admitting: Primary Care

## 2018-12-12 ENCOUNTER — Other Ambulatory Visit: Payer: Self-pay

## 2018-12-12 VITALS — BP 134/64 | HR 80 | Temp 98.3°F | Ht 65.5 in | Wt 196.5 lb

## 2018-12-12 DIAGNOSIS — E1140 Type 2 diabetes mellitus with diabetic neuropathy, unspecified: Secondary | ICD-10-CM | POA: Diagnosis not present

## 2018-12-12 DIAGNOSIS — C189 Malignant neoplasm of colon, unspecified: Secondary | ICD-10-CM | POA: Diagnosis not present

## 2018-12-12 LAB — POCT GLYCOSYLATED HEMOGLOBIN (HGB A1C): Hemoglobin A1C: 7.8 % — AB (ref 4.0–5.6)

## 2018-12-12 NOTE — Progress Notes (Signed)
Subjective:    Patient ID: Carly Buckley, female    DOB: 03/20/1946, 73 y.o.   MRN: [id number]  HPI  Ms. Carly Buckley is a 73 year old female who presents today for follow up of diabetes.  Current medications include: Metformin ER 1000 mg BID, Tradjenta 5 mg daily, Glipizide 10 mg BID.   She is not checking her blood glucose.   Last A1C: 7.5 in May 2020, 7.8 today. Last Eye Exam: Due in December 2020 Last Foot Exam: Due Pneumonia Vaccination: Completed in 2018 ACE/ARB: urine microalbumin pending. Statin: atorvastatin   BP Readings from Last 3 Encounters:  12/12/18 134/64  07/28/18 132/64  06/13/18 126/72   She is not exercising or walking.  She is eating mostly home cooked meals. Infrequent desserts/sweets. She is drinking mostly coffee, water, diet tea.   Review of Systems  Eyes: Negative for visual disturbance.  Respiratory: Negative for shortness of breath.   Cardiovascular: Negative for chest pain.  Neurological: Negative for dizziness and headaches.       Past Medical History:  Diagnosis Date  . Angioedema   . Arthritis   . Colon cancer ([place])    colon ca dx 07, colon resection  . Complication of anesthesia   . Diabetes mellitus   . Diverticulitis   . Dysrhythmia    palpitations  . Helicobacter pylori (H. pylori)   . Hypertension   . Malignant neoplasm of colon   . Neuropathy, idiopathic   . PONV (postoperative nausea and vomiting)   . Type II diabetes mellitus without mention of complication      Social History   Socioeconomic History  . Marital status: Married    Spouse name: Not on file  . Number of children: Not on file  . Years of education: Not on file  . Highest education level: Not on file  Occupational History  . Not on file  Social Needs  . Financial resource strain: Not on file  . Food insecurity    Worry: Not on file    Inability: Not on file  . Transportation needs    Medical: Not on file    Non-medical: Not on file  Tobacco Use  .  Smoking status: Former Smoker    Quit date: 06/18/1972    Years since quitting: 46.5  . Smokeless tobacco: Never Used  Substance and Sexual Activity  . Alcohol use: No  . Drug use: No  . Sexual activity: Not Currently  Lifestyle  . Physical activity    Days per week: Not on file    Minutes per session: Not on file  . Stress: Not on file  Relationships  . Social Herbalist on phone: Not on file    Gets together: Not on file    Attends religious service: Not on file    Active member of club or organization: Not on file    Attends meetings of clubs or organizations: Not on file    Relationship status: Not on file  . Intimate partner violence    Fear of current or ex partner: Not on file    Emotionally abused: Not on file    Physically abused: Not on file    Forced sexual activity: Not on file  Other Topics Concern  . Not on file  Social History Narrative   Married.   4 children, 7 grandchildren, 3 great grandchildren.   Retired. Once worked at [employer]	[place].   Enjoys walking, puzzle books,  spending time with family.    Past Surgical History:  Procedure Laterality Date  . COLON RESECTION  2007   colon cancer  . STERIOD INJECTION Left 08/04/2012   Procedure: STEROID INJECTION;  Surgeon: Gearlean Alf, MD;  Location: [facility];  Service: Orthopedics;  Laterality: Left;  . TONSILLECTOMY    . TOTAL KNEE ARTHROPLASTY Right 08/04/2012   Procedure: TOTAL KNEE ARTHROPLASTY;  Surgeon: Gearlean Alf, MD;  Location: [facility];  Service: Orthopedics;  Laterality: Right;  . TUBAL LIGATION      Family History  Problem Relation Age of Onset  . Lung cancer Mother 11  . Kidney failure Father 81  . Diabetes Sister   . Diabetes Brother   . Brain cancer Daughter     Allergies  Allergen Reactions  . Lisinopril Swelling  . Morphine And Related     Nauseated   . Oxycodone Nausea Only    Current Outpatient Medications on File Prior to Visit  Medication Sig Dispense  Refill  . amLODipine (NORVASC) 10 MG tablet TAKE 1 TABLET BY MOUTH DAILY 90 tablet 1  . atorvastatin (LIPITOR) 20 MG tablet Take 1 tablet (20 mg total) by mouth daily. For cholesterol. 90 tablet 3  . Calcium Carb-Cholecalciferol (CALCIUM+D3 PO) Take 1 tablet by mouth daily.    Marland Kitchen ELIQUIS 5 MG TABS tablet TAKE 1 TABLET BY MOUTH TWICE DAILY 60 tablet 5  . glipiZIDE (GLUCOTROL) 10 MG tablet TAKE 1 TABLET BY MOUTH TWICE DAILY 180 tablet 1  . hydrochlorothiazide (HYDRODIURIL) 25 MG tablet TAKE 1 TABLET BY MOUTH DAILY FOR HIGH BLOOD PRESSURE 90 tablet 1  . linagliptin (TRADJENTA) 5 MG TABS tablet Take 1 tablet (5 mg total) by mouth daily. For diabetes. 90 tablet 3  . metFORMIN (GLUCOPHAGE-XR) 500 MG 24 hr tablet TAKE 2 TABLETS BY MOUTH TWICE DAILY 360 tablet 1  . metoprolol succinate (TOPROL-XL) 100 MG 24 hr tablet TAKE 1 TABLET BY MOUTH DAILY WITH OR IMMEDIATELY FOLLOWING A MEAL 90 tablet 3  . Multiple Vitamin (MULTIVITAMIN WITH MINERALS) TABS tablet Take 1 tablet by mouth daily.    . Multiple Vitamins-Minerals (EYE VITAMINS & MINERALS PO) Take 2 capsules by mouth daily.    . ONE TOUCH ULTRA TEST test strip USE AS DIRECTED TO TEST BLOOD SUGAR 100 each 2  . Propylene Glycol (SYSTANE BALANCE) 0.6 % SOLN Place 1 drop into both eyes daily as needed (for dry eyes).     No current facility-administered medications on file prior to visit.     BP 134/64   Pulse 80   Temp 98.3 F (36.8 C) (Temporal)   Ht 5' 5.5" (1.664 m)   Wt 196 lb 8 oz (89.1 kg)   SpO2 95%   BMI 32.20 kg/m    Objective:   Physical Exam  Constitutional: She appears well-nourished.  Neck: Neck supple.  Cardiovascular: Normal rate and regular rhythm.  Respiratory: Effort normal and breath sounds normal.  Skin: Skin is warm and dry.           Assessment & Plan:

## 2018-12-12 NOTE — Patient Instructions (Addendum)
Stop by the lab prior to leaving today. I will notify you of your results once received.   Start exercising. You should be getting 150 minutes of exercise weekly.  It is important that you improve your diet. Please limit carbohydrates in the form of white bread, rice, pasta, sweets, fast food, fried food, sugary drinks, etc. Increase your consumption of fresh fruits and vegetables, whole grains, lean protein.  Ensure you are consuming 64 ounces of water daily.  We will see you in March 2021 for your annual physical or sooner if needed.  It was a pleasure to see you today!

## 2018-12-12 NOTE — Assessment & Plan Note (Signed)
Colonoscopy scheduled for next month per [facility]. She will have them send the report.

## 2018-12-12 NOTE — Addendum Note (Signed)
Addended by: Ellamae Sia on: 12/12/2018 03:25 PM   Modules accepted: Orders

## 2018-12-12 NOTE — Assessment & Plan Note (Addendum)
A1C today of 7.8 which is overall well controlled for her age. Discussed to work on diet and start exercising to prevent an increase in level.   Foot exam and urine microalbumin today. Pneumonia vaccination UTD. Managed on statin. Eye exam UTD.  Continue current regimen. Follow up in 6 months.

## 2018-12-13 LAB — MICROALBUMIN / CREATININE URINE RATIO
Creatinine, Urine: 86 mg/dL (ref 20–275)
Microalb Creat Ratio: 31 mcg/mg creat — ABNORMAL HIGH (ref <30)
Microalb, Ur: 2.7 mg/dL

## 2019-02-13 ENCOUNTER — Other Ambulatory Visit: Payer: Self-pay | Admitting: Primary Care

## 2019-02-13 DIAGNOSIS — I10 Essential (primary) hypertension: Secondary | ICD-10-CM

## 2019-03-16 ENCOUNTER — Other Ambulatory Visit: Payer: Self-pay | Admitting: Cardiovascular Disease

## 2019-03-16 ENCOUNTER — Other Ambulatory Visit: Payer: Self-pay | Admitting: Primary Care

## 2019-03-16 DIAGNOSIS — E1140 Type 2 diabetes mellitus with diabetic neuropathy, unspecified: Secondary | ICD-10-CM

## 2019-03-16 NOTE — Telephone Encounter (Signed)
Eliquis 5mg  refill request received. Pt is 73yrs old, weight-89.1kg, Crea-0.71on 06/05/2018, Diagnosis-Afib, and last seen by Dr. Acie Fredrickson on 12/28/2018. Dose is appropriate based on dosing criteria. Will send in refill to requested pharmacy.

## 2019-04-20 ENCOUNTER — Other Ambulatory Visit: Payer: Self-pay | Admitting: Primary Care

## 2019-04-20 DIAGNOSIS — E1140 Type 2 diabetes mellitus with diabetic neuropathy, unspecified: Secondary | ICD-10-CM

## 2019-05-14 ENCOUNTER — Other Ambulatory Visit: Payer: Self-pay | Admitting: Primary Care

## 2019-05-14 DIAGNOSIS — I10 Essential (primary) hypertension: Secondary | ICD-10-CM

## 2019-05-14 DIAGNOSIS — E785 Hyperlipidemia, unspecified: Secondary | ICD-10-CM

## 2019-05-19 ENCOUNTER — Other Ambulatory Visit: Payer: Self-pay | Admitting: Primary Care

## 2019-05-19 DIAGNOSIS — E1140 Type 2 diabetes mellitus with diabetic neuropathy, unspecified: Secondary | ICD-10-CM

## 2019-05-29 ENCOUNTER — Other Ambulatory Visit: Payer: Self-pay | Admitting: Primary Care

## 2019-05-29 DIAGNOSIS — E782 Mixed hyperlipidemia: Secondary | ICD-10-CM

## 2019-05-29 DIAGNOSIS — I10 Essential (primary) hypertension: Secondary | ICD-10-CM

## 2019-05-29 DIAGNOSIS — E119 Type 2 diabetes mellitus without complications: Secondary | ICD-10-CM

## 2019-05-29 DIAGNOSIS — E1140 Type 2 diabetes mellitus with diabetic neuropathy, unspecified: Secondary | ICD-10-CM

## 2019-06-11 ENCOUNTER — Ambulatory Visit: Payer: Medicare Other

## 2019-06-11 ENCOUNTER — Telehealth: Payer: Self-pay

## 2019-06-11 NOTE — Telephone Encounter (Signed)
FYI- Called patient 3 times trying to complete her medicare wellness visit. Patient never answered the phone. Left message notifying patient that appointment will be cancelled and provider may complete at upcoming physical appointment.

## 2019-06-11 NOTE — Telephone Encounter (Signed)
Noted  

## 2019-06-12 ENCOUNTER — Other Ambulatory Visit (INDEPENDENT_AMBULATORY_CARE_PROVIDER_SITE_OTHER): Payer: Medicare Other

## 2019-06-12 ENCOUNTER — Other Ambulatory Visit: Payer: Self-pay

## 2019-06-12 DIAGNOSIS — E782 Mixed hyperlipidemia: Secondary | ICD-10-CM | POA: Diagnosis not present

## 2019-06-12 DIAGNOSIS — I10 Essential (primary) hypertension: Secondary | ICD-10-CM

## 2019-06-12 DIAGNOSIS — E119 Type 2 diabetes mellitus without complications: Secondary | ICD-10-CM | POA: Diagnosis not present

## 2019-06-12 LAB — COMPREHENSIVE METABOLIC PANEL
ALT: 12 U/L (ref 0–35)
AST: 13 U/L (ref 0–37)
Albumin: 4.3 g/dL (ref 3.5–5.2)
Alkaline Phosphatase: 62 U/L (ref 39–117)
BUN: 16 mg/dL (ref 6–23)
CO2: 29 mEq/L (ref 19–32)
Calcium: 10.3 mg/dL (ref 8.4–10.5)
Chloride: 99 mEq/L (ref 96–112)
Creatinine, Ser: 0.72 mg/dL (ref 0.40–1.20)
GFR: 79.29 mL/min (ref >60.00)
Glucose, Bld: 168 mg/dL — ABNORMAL HIGH (ref 70–99)
Potassium: 3.9 mEq/L (ref 3.5–5.1)
Sodium: 137 mEq/L (ref 135–145)
Total Bilirubin: 0.5 mg/dL (ref 0.2–1.2)
Total Protein: 7.8 g/dL (ref 6.0–8.3)

## 2019-06-12 LAB — MICROALBUMIN / CREATININE URINE RATIO
Creatinine,U: 32.8 mg/dL
Microalb Creat Ratio: 2.1 mg/g (ref 0.0–30.0)
Microalb, Ur: <0.7 mg/dL (ref 0.0–1.9)

## 2019-06-12 LAB — CBC
HCT: 34.8 % — ABNORMAL LOW (ref 36.0–46.0)
Hemoglobin: 11.6 g/dL — ABNORMAL LOW (ref 12.0–15.0)
MCHC: 33.4 g/dL (ref 30.0–36.0)
MCV: 89.9 fl (ref 78.0–100.0)
Platelets: 193 10*3/uL (ref 150.0–400.0)
RBC: 3.87 Mil/uL (ref 3.87–5.11)
RDW: 16.1 % — ABNORMAL HIGH (ref 11.5–15.5)
WBC: 6.6 10*3/uL (ref 4.0–10.5)

## 2019-06-12 LAB — LIPID PANEL
Cholesterol: 108 mg/dL (ref 0–200)
HDL: 47.9 mg/dL (ref >39.00)
LDL Cholesterol: 53 mg/dL (ref 0–99)
NonHDL: 60.41
Total CHOL/HDL Ratio: 2
Triglycerides: 39 mg/dL (ref 0.0–149.0)
VLDL: 7.8 mg/dL (ref 0.0–40.0)

## 2019-06-13 LAB — HEMOGLOBIN A1C
Hgb A1c MFr Bld: 7.5 % of total Hgb — ABNORMAL HIGH (ref <5.7)
Mean Plasma Glucose: 169 (calc)
eAG (mmol/L): 9.3 (calc)

## 2019-06-18 ENCOUNTER — Encounter: Payer: Medicare Other | Admitting: Primary Care

## 2019-07-02 ENCOUNTER — Encounter: Payer: Medicare Other | Admitting: Primary Care

## 2019-07-21 ENCOUNTER — Other Ambulatory Visit: Payer: Self-pay | Admitting: Primary Care

## 2019-07-21 DIAGNOSIS — E1140 Type 2 diabetes mellitus with diabetic neuropathy, unspecified: Secondary | ICD-10-CM

## 2019-07-27 ENCOUNTER — Ambulatory Visit (INDEPENDENT_AMBULATORY_CARE_PROVIDER_SITE_OTHER): Payer: Medicare Other | Admitting: Primary Care

## 2019-07-27 ENCOUNTER — Other Ambulatory Visit: Payer: Self-pay

## 2019-07-27 VITALS — BP 134/87 | HR 87 | Temp 96.9°F | Ht 65.5 in | Wt 190.5 lb

## 2019-07-27 DIAGNOSIS — E2839 Other primary ovarian failure: Secondary | ICD-10-CM

## 2019-07-27 DIAGNOSIS — C189 Malignant neoplasm of colon, unspecified: Secondary | ICD-10-CM

## 2019-07-27 DIAGNOSIS — Z23 Encounter for immunization: Secondary | ICD-10-CM

## 2019-07-27 DIAGNOSIS — Z1231 Encounter for screening mammogram for malignant neoplasm of breast: Secondary | ICD-10-CM | POA: Diagnosis not present

## 2019-07-27 DIAGNOSIS — I480 Paroxysmal atrial fibrillation: Secondary | ICD-10-CM

## 2019-07-27 DIAGNOSIS — G4700 Insomnia, unspecified: Secondary | ICD-10-CM

## 2019-07-27 DIAGNOSIS — M1710 Unilateral primary osteoarthritis, unspecified knee: Secondary | ICD-10-CM

## 2019-07-27 DIAGNOSIS — E1140 Type 2 diabetes mellitus with diabetic neuropathy, unspecified: Secondary | ICD-10-CM

## 2019-07-27 DIAGNOSIS — E785 Hyperlipidemia, unspecified: Secondary | ICD-10-CM

## 2019-07-27 DIAGNOSIS — I10 Essential (primary) hypertension: Secondary | ICD-10-CM | POA: Diagnosis not present

## 2019-07-27 DIAGNOSIS — Z0000 Encounter for general adult medical examination without abnormal findings: Secondary | ICD-10-CM

## 2019-07-27 MED ORDER — ZOSTER VAC RECOMB ADJUVANTED 50 MCG/0.5ML IM SUSR: 0.5000 mL | Freq: Once | INTRAMUSCULAR | 1 refills | Status: AC

## 2019-07-27 NOTE — Assessment & Plan Note (Signed)
Following with orthopedics.  Right knee replaced, left knee undergoing injections.

## 2019-07-27 NOTE — Assessment & Plan Note (Signed)
Colonoscopy UTD, due again in 2025. Follows with [facility].

## 2019-07-27 NOTE — Patient Instructions (Addendum)
Call the [facility] to schedule your mammogram and bone density test.  Start exercising. You should be getting 150 minutes of exercise weekly.  Continue to eat a healthy diet. Ensure you are consuming 64 ounces of water daily.  Take the shingles vaccine to your pharmacy for administration.   Please schedule a follow up appointment in 6 months for diabetes check.   It was a pleasure to see you today!     Preventive Care 16 Years and Older, Female Preventive care refers to lifestyle choices and visits with your health care provider that can promote health and wellness. This includes:  A yearly physical exam. This is also called an annual well check.  Regular dental and eye exams.  Immunizations.  Screening for certain conditions.  Healthy lifestyle choices, such as diet and exercise. What can I expect for my preventive care visit? Physical exam Your health care provider will check:  Height and weight. These may be used to calculate body mass index (BMI), which is a measurement that tells if you are at a healthy weight.  Heart rate and blood pressure.  Your skin for abnormal spots. Counseling Your health care provider may ask you questions about:  Alcohol, tobacco, and drug use.  Emotional well-being.  Home and relationship well-being.  Sexual activity.  Eating habits.  History of falls.  Memory and ability to understand (cognition).  Work and work Statistician.  Pregnancy and menstrual history. What immunizations do I need?  Influenza (flu) vaccine  This is recommended every year. Tetanus, diphtheria, and pertussis (Tdap) vaccine  You may need a Td booster every 10 years. Varicella (chickenpox) vaccine  You may need this vaccine if you have not already been vaccinated. Zoster (shingles) vaccine  You may need this after age 74. Pneumococcal conjugate (PCV13) vaccine  One dose is recommended after age 74. Pneumococcal polysaccharide  (PPSV23) vaccine  One dose is recommended after age 74. Measles, mumps, and rubella (MMR) vaccine  You may need at least one dose of MMR if you were born in 1957 or later. You may also need a second dose. Meningococcal conjugate (MenACWY) vaccine  You may need this if you have certain conditions. Hepatitis A vaccine  You may need this if you have certain conditions or if you travel or work in places where you may be exposed to hepatitis A. Hepatitis B vaccine  You may need this if you have certain conditions or if you travel or work in places where you may be exposed to hepatitis B. Haemophilus influenzae type b (Hib) vaccine  You may need this if you have certain conditions. You may receive vaccines as individual doses or as more than one vaccine together in one shot (combination vaccines). Talk with your health care provider about the risks and benefits of combination vaccines. What tests do I need? Blood tests  Lipid and cholesterol levels. These may be checked every 5 years, or more frequently depending on your overall health.  Hepatitis C test.  Hepatitis B test. Screening  Lung cancer screening. You may have this screening every year starting at age 74 if you have a 30-pack-year history of smoking and currently smoke or have quit within the past 15 years.  Colorectal cancer screening. All adults should have this screening starting at age 74 and continuing until age 62. Your health care provider may recommend screening at age 38 if you are at increased risk. 74 You will have tests every 1-10 years, depending on your results  and the type of screening test.  Diabetes screening. This is done by checking your blood sugar (glucose) after you have not eaten for a while (fasting). You may have this done every 1-3 years.  Mammogram. This may be done every 1-2 years. Talk with your health care provider about how often you should have regular mammograms.  BRCA-related cancer screening.  This may be done if you have a family history of breast, ovarian, tubal, or peritoneal cancers. Other tests  Sexually transmitted disease (STD) testing.  Bone density scan. This is done to screen for osteoporosis. You may have this done starting at age 74. Follow these instructions at home: Eating and drinking  Eat a diet that includes fresh fruits and vegetables, whole grains, lean protein, and low-fat dairy products. Limit your intake of foods with high amounts of sugar, saturated fats, and salt.  Take vitamin and mineral supplements as recommended by your health care provider.  Do not drink alcohol if your health care provider tells you not to drink.  If you drink alcohol: ? Limit how much you have to 0-1 drink a day. ? Be aware of how much alcohol is in your drink. In the [place] one drink equals one 12 oz bottle of beer (355 mL), one 5 oz glass of wine (148 mL), or one 1 oz glass of hard liquor (44 mL). Lifestyle  Take daily care of your teeth and gums.  Stay active. Exercise for at least 30 minutes on 5 or more days each week.  Do not use any products that contain nicotine or tobacco, such as cigarettes, e-cigarettes, and chewing tobacco. If you need help quitting, ask your health care provider.  If you are sexually active, practice safe sex. Use a condom or other form of protection in order to prevent STIs (sexually transmitted infections).  Talk with your health care provider about taking a low-dose aspirin or statin. What's next?  Go to your health care provider once a year for a well check visit.  Ask your health care provider how often you should have your eyes and teeth checked.  Stay up to date on all vaccines. This information is not intended to replace advice given to you by your health care provider. Make sure you discuss any questions you have with your health care provider. Document Revised: 03/27/2018 Document Reviewed: 03/27/2018 Elsevier Patient Education   2020 [employer].

## 2019-07-27 NOTE — Assessment & Plan Note (Signed)
LDL under good control, continue atorvastatin.

## 2019-07-27 NOTE — Assessment & Plan Note (Signed)
Rx for Shingrix provided, other immunizations UTD. Mammogram and bone density scan due, orders placed. Colonoscopy UTD, due again in 2025. Encouraged a healthy diet, regular exercise.  Exam today stable. Labs reviewed.

## 2019-07-27 NOTE — Assessment & Plan Note (Signed)
Regular rate and rhythm today, continue Eliquis and metoprolol.

## 2019-07-27 NOTE — Assessment & Plan Note (Addendum)
A1C today of 7.5 which is under good control. Continue Metformin, Tradjenta, Glipizide.  She will schedule an eye exam. Pneumonia vaccination UTD. Managed on statin. Urine microalbumin UTD. Foot exam UTD.  Follow up in 6 months.

## 2019-07-27 NOTE — Progress Notes (Signed)
Subjective:    Patient ID: Carly Buckley, female    DOB: July 09, 1945, 74 y.o.   MRN: [id number]  HPI  This visit occurred during the SARS-CoV-2 public health emergency.  Safety protocols were in place, including screening questions prior to the visit, additional usage of staff PPE, and extensive cleaning of exam room while observing appropriate contact time as indicated for disinfecting solutions.   Carly Buckley is a 74 year old female who presents today for complete physical.  Immunizations: -Tetanus: Completed in 2008 -Influenza: Completed last season  -Shingles: Never completed -Pneumonia: Completed Prevnar in 2016, Pneumovax in 2018  Diet: She endorses a fair diet. Exercise: She is walking most everyday.  Eye exam: No recent exam, she will call to schedule.  Dental exam: No recent exam  Mammogram: Completed in 2019 Dexa: Completed in 2019 Colonoscopy: Completed in 2020, due in 2025. Hep C Screen: Negative   BP Readings from Last 3 Encounters:  07/27/19 134/87  12/12/18 134/64  07/28/18 132/64      Review of Systems  Constitutional: Negative for unexpected weight change.  HENT: Negative for rhinorrhea.   Respiratory: Negative for cough and shortness of breath.   Cardiovascular: Negative for chest pain.  Gastrointestinal: Negative for constipation and diarrhea.  Genitourinary: Negative for difficulty urinating.  Musculoskeletal: Positive for arthralgias.  Skin: Negative for rash.  Allergic/Immunologic: Negative for environmental allergies.  Neurological: Negative for dizziness, numbness and headaches.  Psychiatric/Behavioral: The patient is not nervous/anxious.        Past Medical History:  Diagnosis Date  . Angioedema   . Arthritis   . Colon cancer ([place])    colon ca dx 07, colon resection  . Complication of anesthesia   . Diabetes mellitus   . Diverticulitis   . Dysrhythmia    palpitations  . Helicobacter pylori (H. pylori)   . Hypertension   .  Malignant neoplasm of colon   . Neuropathy, idiopathic   . PONV (postoperative nausea and vomiting)   . Type II diabetes mellitus without mention of complication      Social History   Socioeconomic History  . Marital status: Married    Spouse name: Not on file  . Number of children: Not on file  . Years of education: Not on file  . Highest education level: Not on file  Occupational History  . Not on file  Tobacco Use  . Smoking status: Former Smoker    Quit date: 06/18/1972    Years since quitting: 47.1  . Smokeless tobacco: Never Used  Substance and Sexual Activity  . Alcohol use: No  . Drug use: No  . Sexual activity: Not Currently  Other Topics Concern  . Not on file  Social History Narrative   Married.   4 children, 7 grandchildren, 3 great grandchildren.   Retired. Once worked at [employer]	[place].   Enjoys walking, puzzle books, spending time with family.   Social Determinants of Health   Financial Resource Strain:   . Difficulty of Paying Living Expenses:   Food Insecurity:   . Worried About Charity fundraiser in the Last Year:   . Arboriculturist in the Last Year:   Transportation Needs:   . Film/video editor (Medical):   Marland Kitchen Lack of Transportation (Non-Medical):   Physical Activity:   . Days of Exercise per Week:   . Minutes of Exercise per Session:   Stress:   . Feeling of Stress :   Social  Connections:   . Frequency of Communication with Friends and Family:   . Frequency of Social Gatherings with Friends and Family:   . Attends Religious Services:   . Active Member of Clubs or Organizations:   . Attends Archivist Meetings:   Marland Kitchen Marital Status:   Intimate Partner Violence:   . Fear of Current or Ex-Partner:   . Emotionally Abused:   Marland Kitchen Physically Abused:   . Sexually Abused:     Past Surgical History:  Procedure Laterality Date  . COLON RESECTION  2007   colon cancer  . STERIOD INJECTION Left 08/04/2012   Procedure: STEROID  INJECTION;  Surgeon: Gearlean Alf, MD;  Location: [facility];  Service: Orthopedics;  Laterality: Left;  . TONSILLECTOMY    . TOTAL KNEE ARTHROPLASTY Right 08/04/2012   Procedure: TOTAL KNEE ARTHROPLASTY;  Surgeon: Gearlean Alf, MD;  Location: [facility];  Service: Orthopedics;  Laterality: Right;  . TUBAL LIGATION      Family History  Problem Relation Age of Onset  . Lung cancer Mother 28  . Kidney failure Father 26  . Diabetes Sister   . Diabetes Brother   . Brain cancer Daughter     Allergies  Allergen Reactions  . Lisinopril Swelling  . Morphine And Related     Nauseated   . Oxycodone Nausea Only    Current Outpatient Medications on File Prior to Visit  Medication Sig Dispense Refill  . amLODipine (NORVASC) 10 MG tablet TAKE 1 TABLET BY MOUTH DAILY 90 tablet 1  . atorvastatin (LIPITOR) 20 MG tablet TAKE 1 TABLET BY MOUTH DAILY FOR CHOLESTEROL 90 tablet 1  . Calcium Carb-Cholecalciferol (CALCIUM+D3 PO) Take 1 tablet by mouth daily.    Marland Kitchen ELIQUIS 5 MG TABS tablet TAKE 1 TABLET BY MOUTH TWICE DAILY 60 tablet 5  . glipiZIDE (GLUCOTROL) 10 MG tablet TAKE 1 TABLET BY MOUTH TWICE A DAY 180 tablet 1  . hydrochlorothiazide (HYDRODIURIL) 25 MG tablet TAKE 1 TABLET BY MOUTH DAILY FOR HIGH BLOOD PRESSURE 90 tablet 1  . metFORMIN (GLUCOPHAGE-XR) 500 MG 24 hr tablet TAKE 2 TABLETS BY MOUTH TWICE DAILY 360 tablet 1  . metoprolol succinate (TOPROL-XL) 100 MG 24 hr tablet TAKE 1 TABLET BY MOUTH DAILY WITH OR IMMEDIATELY FOLLOWING A MEAL 90 tablet 3  . Multiple Vitamin (MULTIVITAMIN WITH MINERALS) TABS tablet Take 1 tablet by mouth daily.    . Multiple Vitamins-Minerals (EYE VITAMINS & MINERALS PO) Take 2 capsules by mouth daily.    . ONE TOUCH ULTRA TEST test strip USE AS DIRECTED TO TEST BLOOD SUGAR 100 each 2  . Propylene Glycol (SYSTANE BALANCE) 0.6 % SOLN Place 1 drop into both eyes daily as needed (for dry eyes).    . TRADJENTA 5 MG TABS tablet TAKE 1 TABLET BY MOUTH DAILY FOR DIABETES 90  tablet 3   No current facility-administered medications on file prior to visit.    BP 134/87   Pulse 87   Temp (!) 96.9 F (36.1 C) (Temporal)   Ht 5' 5.5" (1.664 m)   Wt 190 lb 8 oz (86.4 kg)   SpO2 96%   BMI 31.22 kg/m    Objective:   Physical Exam  Constitutional: She is oriented to person, place, and time. She appears well-nourished.  HENT:  Right Ear: Tympanic membrane and ear canal normal.  Left Ear: Tympanic membrane and ear canal normal.  Mouth/Throat: Oropharynx is clear and moist.  Eyes: Pupils are equal, round, and reactive  to light. EOM are normal.  Cardiovascular: Normal rate and regular rhythm.  Respiratory: Effort normal and breath sounds normal.  GI: Soft. Bowel sounds are normal. There is no abdominal tenderness.  Musculoskeletal:        General: Normal range of motion.     Cervical back: Neck supple.  Neurological: She is alert and oriented to person, place, and time. No cranial nerve deficit.  Reflex Scores:      Patellar reflexes are 2+ on the right side and 2+ on the left side. Skin: Skin is warm and dry.  Psychiatric: She has a normal mood and affect.           Assessment & Plan:

## 2019-07-27 NOTE — Assessment & Plan Note (Addendum)
Stable in the office today, continue HCTZ, metoprolol sucicnate, amlodipine. CMP reviewed.

## 2019-07-27 NOTE — Assessment & Plan Note (Signed)
Denies concerns.

## 2019-07-28 ENCOUNTER — Ambulatory Visit: Payer: Medicare Other | Admitting: Cardiovascular Disease

## 2019-08-05 NOTE — Progress Notes (Deleted)
OFFICE NOTE  Chief Complaint:   Occasional edema  Primary Care Physician: Pleas Koch, NP  Problem list 1. Essential hypertension 2. Atrial fibrillation 3. Diabetes Mellitus    HPI:  Carly Buckley  Is a pleasant 74 year old female kindly referred to me by Dr. Birdie Riddle for palpitations. She recently saw her in the office for a routine physical and was found to have atrial fibrillation. She does have a history of palpitations has gone on for number of years     July 22, 2015:  I'm seeing Carly Buckley again today after a several year absence.   She has been seen by Dr. Debara Pickett in the recent past.   I have seen her and her husband in the past.  No CP or dyspnea.   Does not check her BP at home .  Can tell when she goes into atrial fib - lasts a few minute /  Has been walking 2 miles a day  - has had some recent knee issues and she has slacked off a bit   03/06/2016:  No pains or problems   Aug 24, 2016: Doing well. Is not exercising as much as she would like .  Aug. 31, 2018: Carly Buckley is seen today for follow up of her paroxysmal atrial fib . Walks with a cane now.  Has neuropathy - helps with balance  Is maintaining NSR   July 18, 2017  Doing well.  Very few palpitations.   BP and HR are normal  Exercises some   August 06, 2019:  Carly Buckley seen today for follow-up of her palpitations, diabetes mellitus and episodes of paroxysmal atrial fibrillation.  PMHx:  Past Medical History:  Diagnosis Date  . Angioedema   . Arthritis   . Colon cancer ([place])    colon ca dx 07, colon resection  . Complication of anesthesia   . Diabetes mellitus   . Diverticulitis   . Dysrhythmia    palpitations  . Helicobacter pylori (H. pylori)   . Hypertension   . Malignant neoplasm of colon   . Neuropathy, idiopathic   . PONV (postoperative nausea and vomiting)   . Type II diabetes mellitus without mention of complication     Past Surgical History:  Procedure Laterality Date  . COLON  RESECTION  2007   colon cancer  . STERIOD INJECTION Left 08/04/2012   Procedure: STEROID INJECTION;  Surgeon: Gearlean Alf, MD;  Location: [facility];  Service: Orthopedics;  Laterality: Left;  . TONSILLECTOMY    . TOTAL KNEE ARTHROPLASTY Right 08/04/2012   Procedure: TOTAL KNEE ARTHROPLASTY;  Surgeon: Gearlean Alf, MD;  Location: [facility];  Service: Orthopedics;  Laterality: Right;  . TUBAL LIGATION      FAMHx:  Family History  Problem Relation Age of Onset  . Lung cancer Mother 36  . Kidney failure Father 48  . Diabetes Sister   . Diabetes Brother   . Brain cancer Daughter     SOCHx:   reports that she quit smoking about 47 years ago. She has never used smokeless tobacco. She reports that she does not drink alcohol or use drugs.  ALLERGIES:  Allergies  Allergen Reactions  . Lisinopril Swelling  . Morphine And Related     Nauseated   . Oxycodone Nausea Only    ROS: Noted in current hx. Otherwise negative   HOME MEDS: Current Outpatient Medications  Medication Sig Dispense Refill  . amLODipine (NORVASC) 10 MG tablet TAKE 1 TABLET BY  MOUTH DAILY 90 tablet 1  . atorvastatin (LIPITOR) 20 MG tablet TAKE 1 TABLET BY MOUTH DAILY FOR CHOLESTEROL 90 tablet 1  . Calcium Carb-Cholecalciferol (CALCIUM+D3 PO) Take 1 tablet by mouth daily.    Marland Kitchen ELIQUIS 5 MG TABS tablet TAKE 1 TABLET BY MOUTH TWICE DAILY 60 tablet 5  . glipiZIDE (GLUCOTROL) 10 MG tablet TAKE 1 TABLET BY MOUTH TWICE A DAY 180 tablet 1  . hydrochlorothiazide (HYDRODIURIL) 25 MG tablet TAKE 1 TABLET BY MOUTH DAILY FOR HIGH BLOOD PRESSURE 90 tablet 1  . metFORMIN (GLUCOPHAGE-XR) 500 MG 24 hr tablet TAKE 2 TABLETS BY MOUTH TWICE DAILY 360 tablet 1  . metoprolol succinate (TOPROL-XL) 100 MG 24 hr tablet TAKE 1 TABLET BY MOUTH DAILY WITH OR IMMEDIATELY FOLLOWING A MEAL 90 tablet 3  . Multiple Vitamin (MULTIVITAMIN WITH MINERALS) TABS tablet Take 1 tablet by mouth daily.    . Multiple Vitamins-Minerals (EYE VITAMINS &  MINERALS PO) Take 2 capsules by mouth daily.    . ONE TOUCH ULTRA TEST test strip USE AS DIRECTED TO TEST BLOOD SUGAR 100 each 2  . Propylene Glycol (SYSTANE BALANCE) 0.6 % SOLN Place 1 drop into both eyes daily as needed (for dry eyes).    . TRADJENTA 5 MG TABS tablet TAKE 1 TABLET BY MOUTH DAILY FOR DIABETES 90 tablet 3   No current facility-administered medications for this visit.    LABS/IMAGING: No results found for this or any previous visit (from the past 48 hour(s)). No results found.  VITALS: There were no vitals taken for this visit.   Physical Exam: There were no vitals taken for this visit.  GEN:  Well nourished, well developed in no acute distress HEENT: Normal NECK: No JVD; No carotid bruits LYMPHATICS: No lymphadenopathy CARDIAC: RRR ***, no murmurs, rubs, gallops RESPIRATORY:  Clear to auscultation without rales, wheezing or rhonchi  ABDOMEN: Soft, non-tender, non-distended MUSCULOSKELETAL:  No edema; No deformity  SKIN: Warm and dry NEUROLOGIC:  Alert and oriented x 3    EKG:      Assessment/plan 1. Paroxysmal atrial fibrillation.     Her CHADS2VASC score is 4 - continue     2. Hypertension:    3. Type 2 diabetes mellitus with hypertension:      Mertie Moores, MD  08/05/2019 8:17 PM    [address],  [place], [place]  [address] Pager [phone] Phone: [phone]; Fax: [phone]

## 2019-08-06 ENCOUNTER — Ambulatory Visit: Payer: Medicare Other | Admitting: Cardiovascular Disease

## 2019-08-10 ENCOUNTER — Telehealth: Payer: Self-pay | Admitting: Primary Care

## 2019-08-10 DIAGNOSIS — E2839 Other primary ovarian failure: Secondary | ICD-10-CM

## 2019-08-10 DIAGNOSIS — Z1231 Encounter for screening mammogram for malignant neoplasm of breast: Secondary | ICD-10-CM

## 2019-08-10 NOTE — Telephone Encounter (Signed)
These orders were already in the system, entered on 07/27/19.

## 2019-08-10 NOTE — Telephone Encounter (Signed)
  Pt called to make her appt for her Mammo/BD and they were unable to schedule - Needs order placed for BD and Mammo sent to [facility]  Please advise, thanks.

## 2019-08-10 NOTE — Telephone Encounter (Signed)
Orders placed again

## 2019-08-10 NOTE — Telephone Encounter (Signed)
Pt aware that the orders have already been placed. She will call back to make them aware to see if they can find them. Nothing further needed.

## 2019-08-10 NOTE — Addendum Note (Signed)
Addended by: Pleas Koch on: 08/10/2019 10:37 AM   Modules accepted: Orders

## 2019-08-10 NOTE — Telephone Encounter (Signed)
Patient stated she spoke with [facility] and they advised her to call our office back. They advised patient that there is no order in the system and would need it resent to the asap to get the patient rescheduled.   Please advise

## 2019-08-10 NOTE — Telephone Encounter (Signed)
Patient have been notified.  

## 2019-08-11 NOTE — Telephone Encounter (Signed)
Faxed as requested

## 2019-08-11 NOTE — Telephone Encounter (Signed)
Spoke with Feliciana Rossetti with [employer] -- requesting a faxed order for the Bone Density -- unable to see Epic orders.   Fax [phone]

## 2019-08-13 ENCOUNTER — Other Ambulatory Visit: Payer: Self-pay | Admitting: Primary Care

## 2019-08-13 DIAGNOSIS — I10 Essential (primary) hypertension: Secondary | ICD-10-CM

## 2019-08-13 DIAGNOSIS — Z780 Asymptomatic menopausal state: Secondary | ICD-10-CM | POA: Diagnosis not present

## 2019-08-13 DIAGNOSIS — Z1231 Encounter for screening mammogram for malignant neoplasm of breast: Secondary | ICD-10-CM | POA: Diagnosis not present

## 2019-08-13 LAB — HM MAMMOGRAPHY

## 2019-08-14 ENCOUNTER — Encounter: Payer: Self-pay | Admitting: Primary Care

## 2019-08-18 ENCOUNTER — Other Ambulatory Visit: Payer: Self-pay | Admitting: Primary Care

## 2019-08-18 ENCOUNTER — Other Ambulatory Visit: Payer: Self-pay | Admitting: Cardiovascular Disease

## 2019-08-18 DIAGNOSIS — I10 Essential (primary) hypertension: Secondary | ICD-10-CM

## 2019-08-20 ENCOUNTER — Telehealth: Payer: Self-pay | Admitting: Primary Care

## 2019-08-20 NOTE — Progress Notes (Signed)
  Chronic Care Management   Outreach Note  08/20/2019 Name: SKYAH LONGCORE MRN: [id number] DOB: 1945/08/30  Referred by: Pleas Koch, NP Reason for referral : No chief complaint on file.   An unsuccessful telephone outreach was attempted today. The patient was referred to the pharmacist for assistance with care management and care coordination.    This note is not being shared with the patient for the following reason: To respect privacy (The patient or proxy has requested that the information not be shared).  Follow Up Plan:   Raynicia Dukes [employer] Scheduler

## 2019-09-08 ENCOUNTER — Ambulatory Visit: Payer: Medicare Other | Admitting: Cardiovascular Disease

## 2019-09-14 ENCOUNTER — Other Ambulatory Visit: Payer: Self-pay | Admitting: Cardiovascular Disease

## 2019-09-15 NOTE — Telephone Encounter (Signed)
Age 74, weight 86kg, SCr 0.72 on 06/12/19, afib indication, has annual follow up scheduled next week

## 2019-09-16 ENCOUNTER — Ambulatory Visit (INDEPENDENT_AMBULATORY_CARE_PROVIDER_SITE_OTHER): Payer: Medicare Other

## 2019-09-16 DIAGNOSIS — Z0000 Encounter for general adult medical examination without abnormal findings: Secondary | ICD-10-CM

## 2019-09-16 NOTE — Progress Notes (Signed)
PCP notes:  Health Maintenance: Eye exam- due Tdap- insurance/financial   Abnormal Screenings: none   Patient concerns: none   Nurse concerns: none   Next PCP appt.: 01/26/2020 @ 8:40 am

## 2019-09-16 NOTE — Progress Notes (Signed)
Subjective:   Carly Buckley is a 74 y.o. female who presents for an Initial Medicare Annual Wellness Visit.  Review of Systems: N/A     I connected with the patient today by telephone and verified that I am speaking with the correct person using two identifiers. Location patient: home Location nurse: work Persons participating in the virtual visit: patient, Marine scientist.   I discussed the limitations, risks, security and privacy concerns of performing an evaluation and management service by telephone and the availability of in person appointments. I also discussed with the patient that there may be a patient responsible charge related to this service. The patient expressed understanding and verbally consented to this telephonic visit.    Interactive audio and video telecommunications were attempted between this nurse and patient, however failed, due to patient having technical difficulties OR patient did not have access to video capability.  We continued and completed visit with audio only.      Cardiac Risk Factors include: advanced age (>38men, >90 women);hypertension;diabetes mellitus;dyslipidemia     Objective:    Today's Vitals   There is no height or weight on file to calculate BMI.  Advanced Directives 09/16/2019 06/05/2018 05/31/2017 08/04/2012 08/04/2012 07/28/2012 06/18/2012  Does Patient Have a Medical Advance Directive? Yes Yes Yes Patient has advance directive, copy not in chart Patient has advance directive, copy not in chart Patient has advance directive, copy not in chart Patient has advance directive, copy not in chart  Type of Advance Directive [place];Living will [place];Living will [place];Living will Living will Living will (No Data) -  Copy of [place] in Chart? No - copy requested No - copy requested No - copy requested Copy requested from family Copy requested from family Copy requested from  family Copy requested from family  Pre-existing out of facility DNR order (yellow form or pink MOST form) - - - No No - -    Current Medications (verified) Outpatient Encounter Medications as of 09/16/2019  Medication Sig  . amLODipine (NORVASC) 10 MG tablet TAKE 1 TABLET BY MOUTH DAILY  . atorvastatin (LIPITOR) 20 MG tablet TAKE 1 TABLET BY MOUTH DAILY FOR CHOLESTEROL  . Calcium Carb-Cholecalciferol (CALCIUM+D3 PO) Take 1 tablet by mouth daily.  Marland Kitchen ELIQUIS 5 MG TABS tablet TAKE 1 TABLET BY MOUTH TWICE DAILY  . glipiZIDE (GLUCOTROL) 10 MG tablet TAKE 1 TABLET BY MOUTH TWICE A DAY  . hydrochlorothiazide (HYDRODIURIL) 25 MG tablet TAKE 1 TABLET BY MOUTH DAILY FOR HIGH BLOOD PRESSURE  . metFORMIN (GLUCOPHAGE-XR) 500 MG 24 hr tablet TAKE 2 TABLETS BY MOUTH TWICE DAILY  . metoprolol succinate (TOPROL-XL) 100 MG 24 hr tablet TAKE 1 TABLET BY MOUTH DAILY WITH A MEAL  . Multiple Vitamin (MULTIVITAMIN WITH MINERALS) TABS tablet Take 1 tablet by mouth daily.  . Multiple Vitamins-Minerals (EYE VITAMINS & MINERALS PO) Take 2 capsules by mouth daily.  . ONE TOUCH ULTRA TEST test strip USE AS DIRECTED TO TEST BLOOD SUGAR  . Propylene Glycol (SYSTANE BALANCE) 0.6 % SOLN Place 1 drop into both eyes daily as needed (for dry eyes).  . TRADJENTA 5 MG TABS tablet TAKE 1 TABLET BY MOUTH DAILY FOR DIABETES   No facility-administered encounter medications on file as of 09/16/2019.    Allergies (verified) Lisinopril, Morphine and related, and Oxycodone   History: Past Medical History:  Diagnosis Date  . Angioedema   . Arthritis   . Colon cancer ([place])  colon ca dx 07, colon resection  . Complication of anesthesia   . Diabetes mellitus   . Diverticulitis   . Dysrhythmia    palpitations  . Helicobacter pylori (H. pylori)   . Hypertension   . Malignant neoplasm of colon   . Neuropathy, idiopathic   . PONV (postoperative nausea and vomiting)   . Type II diabetes mellitus without mention of complication      Past Surgical History:  Procedure Laterality Date  . COLON RESECTION  2007   colon cancer  . STERIOD INJECTION Left 08/04/2012   Procedure: STEROID INJECTION;  Surgeon: Gearlean Alf, MD;  Location: [facility];  Service: Orthopedics;  Laterality: Left;  . TONSILLECTOMY    . TOTAL KNEE ARTHROPLASTY Right 08/04/2012   Procedure: TOTAL KNEE ARTHROPLASTY;  Surgeon: Gearlean Alf, MD;  Location: [facility];  Service: Orthopedics;  Laterality: Right;  . TUBAL LIGATION     Family History  Problem Relation Age of Onset  . Lung cancer Mother 57  . Kidney failure Father 53  . Diabetes Sister   . Diabetes Brother   . Brain cancer Daughter    Social History   Socioeconomic History  . Marital status: Married    Spouse name: Not on file  . Number of children: Not on file  . Years of education: Not on file  . Highest education level: Not on file  Occupational History  . Not on file  Tobacco Use  . Smoking status: Former Smoker    Quit date: 06/18/1972    Years since quitting: 47.2  . Smokeless tobacco: Never Used  Substance and Sexual Activity  . Alcohol use: No  . Drug use: No  . Sexual activity: Not Currently  Other Topics Concern  . Not on file  Social History Narrative   Married.   4 children, 7 grandchildren, 3 great grandchildren.   Retired. Once worked at [employer]	[place].   Enjoys walking, puzzle books, spending time with family.   Social Determinants of Health   Financial Resource Strain: Low Risk   . Difficulty of Paying Living Expenses: Not hard at all  Food Insecurity: No Food Insecurity  . Worried About Charity fundraiser in the Last Year: Never true  . Ran Out of Food in the Last Year: Never true  Transportation Needs: No Transportation Needs  . Lack of Transportation (Medical): No  . Lack of Transportation (Non-Medical): No  Physical Activity: Sufficiently Active  . Days of Exercise per Week: 7 days  . Minutes of Exercise per Session: 30 min  Stress: No  Stress Concern Present  . Feeling of Stress : Not at all  Social Connections:   . Frequency of Communication with Friends and Family:   . Frequency of Social Gatherings with Friends and Family:   . Attends Religious Services:   . Active Member of Clubs or Organizations:   . Attends Archivist Meetings:   Marland Kitchen Marital Status:     Tobacco Counseling Counseling given: Not Answered   Clinical Intake:  Pre-visit preparation completed: Yes  Pain : No/denies pain     Nutritional Risks: None Diabetes: Yes CBG done?: No Did pt. bring in CBG monitor from home?: No  How often do you need to have someone help you when you read instructions, pamphlets, or other written materials from your doctor or pharmacy?: 1 - Never What is the last grade level you completed in school?: 6th  Interpreter Needed?: No  Information  entered by :: CJohnson, LPN   Activities of Daily Living In your present state of health, do you have any difficulty performing the following activities: 09/16/2019  Hearing? N  Vision? N  Difficulty concentrating or making decisions? N  Walking or climbing stairs? N  Dressing or bathing? N  Doing errands, shopping? N  Preparing Food and eating ? N  Using the Toilet? N  In the past six months, have you accidently leaked urine? Y  Comment only when I cough or sneeze  Do you have problems with loss of bowel control? N  Managing your Medications? N  Managing your Finances? N  Housekeeping or managing your Housekeeping? N  Some recent data might be hidden     Immunizations and Health Maintenance Immunization History  Administered Date(s) Administered  . Influenza Split 03/29/2009  . Influenza,inj,Quad PF,6+ Mos 06/05/2018  . Pneumococcal Conjugate-13 11/30/2014  . Pneumococcal Polysaccharide-23 05/08/2016  . Td 04/16/2006   Health Maintenance Due  Topic Date Due  . COVID-19 Vaccine (1) Never done  . OPHTHALMOLOGY EXAM  04/05/2019    Patient Care  Team: Pleas Koch, NP as PCP - General (Internal Medicine) Nahser, Wonda Cheng, MD as PCP - Cardiology (Cardiology) Shirley Muscat Loreen Freud, MD as Referring Physician (Optometry)  Indicate any recent Medical Services you may have received from other than [facility] providers in the past year (date may be approximate).     Assessment:   This is a routine wellness examination for Shadaya.  Hearing/Vision screen  Hearing Screening   125Hz  250Hz  500Hz  1000Hz  2000Hz  3000Hz  4000Hz  6000Hz  8000Hz   Right ear:           Left ear:           Vision Screening Comments: Patient gets annual eye exams  Dietary issues and exercise activities discussed: Current Exercise Habits: Home exercise routine, Type of exercise: walking, Time (Minutes): 30, Frequency (Times/Week): 7, Weekly Exercise (Minutes/Week): 210, Intensity: Moderate, Exercise limited by: None identified  Goals    . Follow up with Primary Care Provider     Starting 06/05/2018, I will continue to take medications as prescribed and to keep appointments as prescribed.     . Patient Stated     09/16/2019, I will continue to walk everyday for 30 minutes.       Depression Screen PHQ 2/9 Scores 09/16/2019 06/05/2018 05/31/2017 05/08/2016 05/04/2015 04/21/2014 04/27/2013  PHQ - 2 Score 0 0 0 0 0 0 0  PHQ- 9 Score 0 0 0 - - - -    Fall Risk Fall Risk  09/16/2019 06/05/2018 05/31/2017 05/04/2015 04/21/2014  Falls in the past year? 0 0 No No No  Number falls in past yr: 0 - - - -  Injury with Fall? 0 - - - -  Risk for fall due to : Medication side effect - - - -  Follow up Falls evaluation completed;Falls prevention discussed - - - -    Is the patient's home free of loose throw rugs in walkways, pet beds, electrical cords, etc?   yes      Grab bars in the bathroom? yes      Handrails on the stairs?   yes      Adequate lighting?   yes  Timed Get Up and Go Performed: N/A  Cognitive Function: MMSE - Mini Mental State Exam 09/16/2019 06/05/2018 05/31/2017    Orientation to time 5 5 5   Orientation to Place 5 5 5   Registration 3 3 3  Attention/ Calculation 5 0 0  Recall 3 3 3   Language- name 2 objects - 0 0  Language- repeat 1 1 1   Language- follow 3 step command - 0 1  Language- follow 3 step command-comments - Refused to complete clock drawing unable to follow 1 step of 3 step command; 3 attempts to complete clock  Language- read & follow direction - 0 0  Write a sentence - 0 0  Copy design - 0 0  Total score - 17 18  Mini Cog  Mini-Cog screen was completed. Maximum score is 22. A value of 0 denotes this part of the MMSE was not completed or the patient failed this part of the Mini-Cog screening.       Screening Tests Health Maintenance  Topic Date Due  . COVID-19 Vaccine (1) Never done  . OPHTHALMOLOGY EXAM  04/05/2019  . TETANUS/TDAP  04/15/2020 (Originally 04/16/2016)  . INFLUENZA VACCINE  11/15/2019  . HEMOGLOBIN A1C  12/10/2019  . FOOT EXAM  12/12/2019  . MAMMOGRAM  08/12/2020  . COLONOSCOPY  01/12/2029  . DEXA SCAN  Completed  . Hepatitis C Screening  Completed  . PNA vac Low Risk Adult  Completed    Qualifies for Shingles Vaccine: Yes  Cancer Screenings: Lung: Low Dose CT Chest recommended if Age 29-80 years, 30 pack-year currently smoking OR have quit w/in 15 years. Patient does not qualify. Breast: Up to date on Mammogram: Yes, completed 08/13/2019  Bone Density/Dexa: completed 08/09/2017 Colorectal: completed 01/13/2019  Additional Screenings:  Hepatitis C Screening: 05/31/2017     Plan:    Patient will continue to walk everyday for 30 minutes.   I have personally reviewed and noted the following in the patient's chart:   . Medical and social history . Use of alcohol, tobacco or illicit drugs  . Current medications and supplements . Functional ability and status . Nutritional status . Physical activity . Advanced directives . List of other physicians . Hospitalizations, surgeries, and ER visits in  previous 12 months . Vitals . Screenings to include cognitive, depression, and falls . Referrals and appointments  In addition, I have reviewed and discussed with patient certain preventive protocols, quality metrics, and best practice recommendations. A written personalized care plan for preventive services as well as general preventive health recommendations were provided to patient.     Andrez Grime, LPN   [phone]

## 2019-09-16 NOTE — Patient Instructions (Signed)
Carly Buckley , Thank you for taking time to come for your Medicare Wellness Visit. I appreciate your ongoing commitment to your health goals. Please review the following plan we discussed and let me know if I can assist you in the future.   Screening recommendations/referrals: Colonoscopy: Up to date, completed 01/13/2019 Mammogram: Up to date, completed 08/13/2019 Bone Density: completed 08/09/2017 Recommended yearly ophthalmology/optometry visit for glaucoma screening and checkup Recommended yearly dental visit for hygiene and checkup  Vaccinations: Influenza vaccine: Fall 2021 Pneumococcal vaccine: Completed series Tdap vaccine: decline Shingles vaccine: discussed    Advanced directives: Please bring a copy of your POA (Power of Attorney) and/or Living Will to your next appointment.   Conditions/risks identified: diabetes, hypertension, hyperlipidemia  Next appointment: 01/26/2020 @ 8:40 am    Preventive Care 74 Years and Older, Female Preventive care refers to lifestyle choices and visits with your health care provider that can promote health and wellness. What does preventive care include?  A yearly physical exam. This is also called an annual well check.  Dental exams once or twice a year.  Routine eye exams. Ask your health care provider how often you should have your eyes checked.  Personal lifestyle choices, including:  Daily care of your teeth and gums.  Regular physical activity.  Eating a healthy diet.  Avoiding tobacco and drug use.  Limiting alcohol use.  Practicing safe sex.  Taking low-dose aspirin every day.  Taking vitamin and mineral supplements as recommended by your health care provider. What happens during an annual well check? The services and screenings done by your health care provider during your annual well check will depend on your age, overall health, lifestyle risk factors, and family history of disease. Counseling  Your health care  provider may ask you questions about your:  Alcohol use.  Tobacco use.  Drug use.  Emotional well-being.  Home and relationship well-being.  Sexual activity.  Eating habits.  History of falls.  Memory and ability to understand (cognition).  Work and work Statistician.  Reproductive health. Screening  You may have the following tests or measurements:  Height, weight, and BMI.  Blood pressure.  Lipid and cholesterol levels. These may be checked every 5 years, or more frequently if you are over 19 years old.  Skin check.  Lung cancer screening. You may have this screening every year starting at age 74 if you have a 30-pack-year history of smoking and currently smoke or have quit within the past 15 years.  Fecal occult blood test (FOBT) of the stool. You may have this test every year starting at age 74.  Flexible sigmoidoscopy or colonoscopy. You may have a sigmoidoscopy every 5 years or a colonoscopy every 10 years starting at age 74.  Hepatitis C blood test.  Hepatitis B blood test.  Sexually transmitted disease (STD) testing.  Diabetes screening. This is done by checking your blood sugar (glucose) after you have not eaten for a while (fasting). You may have this done every 1-3 years.  Bone density scan. This is done to screen for osteoporosis. You may have this done starting at age 74.  Mammogram. This may be done every 1-2 years. Talk to your health care provider about how often you should have regular mammograms. Talk with your health care provider about your test results, treatment options, and if necessary, the need for more tests. Vaccines  Your health care provider may recommend certain vaccines, such as:  Influenza vaccine. This is recommended every year.  Tetanus, diphtheria, and acellular pertussis (Tdap, Td) vaccine. You may need a Td booster every 10 years.  Zoster vaccine. You may need this after age 74.  Pneumococcal 13-valent conjugate (PCV13)  vaccine. One dose is recommended after age 74.  Pneumococcal polysaccharide (PPSV23) vaccine. One dose is recommended after age 74. Talk to your health care provider about which screenings and vaccines you need and how often you need them. This information is not intended to replace advice given to you by your health care provider. Make sure you discuss any questions you have with your health care provider. Document Released: 04/29/2015 Document Revised: 12/21/2015 Document Reviewed: 02/01/2015 Elsevier Interactive Patient Education  2017 [place] Prevention in the Home Falls can cause injuries. They can happen to people of all ages. There are many things you can do to make your home safe and to help prevent falls. What can I do on the outside of my home?  Regularly fix the edges of walkways and driveways and fix any cracks.  Remove anything that might make you trip as you walk through a door, such as a raised step or threshold.  Trim any bushes or trees on the path to your home.  Use bright outdoor lighting.  Clear any walking paths of anything that might make someone trip, such as rocks or tools.  Regularly check to see if handrails are loose or broken. Make sure that both sides of any steps have handrails.  Any raised decks and porches should have guardrails on the edges.  Have any leaves, snow, or ice cleared regularly.  Use sand or salt on walking paths during winter.  Clean up any spills in your garage right away. This includes oil or grease spills. What can I do in the bathroom?  Use night lights.  Install grab bars by the toilet and in the tub and shower. Do not use towel bars as grab bars.  Use non-skid mats or decals in the tub or shower.  If you need to sit down in the shower, use a plastic, non-slip stool.  Keep the floor dry. Clean up any water that spills on the floor as soon as it happens.  Remove soap buildup in the tub or shower  regularly.  Attach bath mats securely with double-sided non-slip rug tape.  Do not have throw rugs and other things on the floor that can make you trip. What can I do in the bedroom?  Use night lights.  Make sure that you have a light by your bed that is easy to reach.  Do not use any sheets or blankets that are too big for your bed. They should not hang down onto the floor.  Have a firm chair that has side arms. You can use this for support while you get dressed.  Do not have throw rugs and other things on the floor that can make you trip. What can I do in the kitchen?  Clean up any spills right away.  Avoid walking on wet floors.  Keep items that you use a lot in easy-to-reach places.  If you need to reach something above you, use a strong step stool that has a grab bar.  Keep electrical cords out of the way.  Do not use floor polish or wax that makes floors slippery. If you must use wax, use non-skid floor wax.  Do not have throw rugs and other things on the floor that can make you trip. What can I do  with my stairs?  Do not leave any items on the stairs.  Make sure that there are handrails on both sides of the stairs and use them. Fix handrails that are broken or loose. Make sure that handrails are as long as the stairways.  Check any carpeting to make sure that it is firmly attached to the stairs. Fix any carpet that is loose or worn.  Avoid having throw rugs at the top or bottom of the stairs. If you do have throw rugs, attach them to the floor with carpet tape.  Make sure that you have a light switch at the top of the stairs and the bottom of the stairs. If you do not have them, ask someone to add them for you. What else can I do to help prevent falls?  Wear shoes that:  Do not have high heels.  Have rubber bottoms.  Are comfortable and fit you well.  Are closed at the toe. Do not wear sandals.  If you use a stepladder:  Make sure that it is fully  opened. Do not climb a closed stepladder.  Make sure that both sides of the stepladder are locked into place.  Ask someone to hold it for you, if possible.  Clearly mark and make sure that you can see:  Any grab bars or handrails.  First and last steps.  Where the edge of each step is.  Use tools that help you move around (mobility aids) if they are needed. These include:  Canes.  Walkers.  Scooters.  Crutches.  Turn on the lights when you go into a dark area. Replace any light bulbs as soon as they burn out.  Set up your furniture so you have a clear path. Avoid moving your furniture around.  If any of your floors are uneven, fix them.  If there are any pets around you, be aware of where they are.  Review your medicines with your doctor. Some medicines can make you feel dizzy. This can increase your chance of falling. Ask your doctor what other things that you can do to help prevent falls. This information is not intended to replace advice given to you by your health care provider. Make sure you discuss any questions you have with your health care provider. Document Released: 01/27/2009 Document Revised: 09/08/2015 Document Reviewed: 05/07/2014 Elsevier Interactive Patient Education  2017 [employer].

## 2019-09-22 ENCOUNTER — Other Ambulatory Visit: Payer: Self-pay

## 2019-09-22 ENCOUNTER — Encounter: Payer: Self-pay | Admitting: Cardiovascular Disease

## 2019-09-22 ENCOUNTER — Ambulatory Visit: Payer: Medicare Other | Admitting: Cardiovascular Disease

## 2019-09-22 VITALS — BP 126/70 | HR 84 | Ht 65.5 in | Wt 194.0 lb

## 2019-09-22 DIAGNOSIS — I480 Paroxysmal atrial fibrillation: Secondary | ICD-10-CM | POA: Diagnosis not present

## 2019-09-22 DIAGNOSIS — I10 Essential (primary) hypertension: Secondary | ICD-10-CM

## 2019-09-22 NOTE — Progress Notes (Signed)
OFFICE NOTE  Chief Complaint:   Occasional edema  Primary Care Physician: Pleas Koch, NP  Problem list 1. Essential hypertension 2. Atrial fibrillation 3. Diabetes Mellitus    HPI:  Carly Buckley  Is a pleasant 74 year old female kindly referred to me by Dr. Birdie Riddle for palpitations. She recently saw her in the office for a routine physical and was found to have atrial fibrillation. She does have a history of palpitations has gone on for number of years     July 22, 2015:  I'm seeing Carly Buckley again today after a several year absence.   She has been seen by Dr. Debara Pickett in the recent past.   I have seen her and her husband in the past.  No CP or dyspnea.   Does not check her BP at home .  Can tell when she goes into atrial fib - lasts a few minute /  Has been walking 2 miles a day  - has had some recent knee issues and she has slacked off a bit   03/06/2016:  No pains or problems   Aug 24, 2016: Doing well. Is not exercising as much as she would like .  Aug. 31, 2018: Carly Buckley is seen today for follow up of her paroxysmal atrial fib . Walks with a cane now.  Has neuropathy - helps with balance  Is maintaining NSR   July 18, 2017  Doing well.  Very few palpitations.   BP and HR are normal  Exercises some   September 22, 2019: Carly Buckley is seen today for follow up of her PAF.   Was last seen 2 years ago  Has occasional plapitations that only last a few seconds.  Never more than a minute  Not associated with dizziness or dyspnea.  Walks almost daily . Monday  - Friday .  1 - 2 miles a day   BP is elevated.   Still eating salty foods.    PMHx:  Past Medical History:  Diagnosis Date  . Angioedema   . Arthritis   . Colon cancer ([place])    colon ca dx 07, colon resection  . Complication of anesthesia   . Diabetes mellitus   . Diverticulitis   . Dysrhythmia    palpitations  . Helicobacter pylori (H. pylori)   . Hypertension   . Malignant neoplasm of colon   .  Neuropathy, idiopathic   . PONV (postoperative nausea and vomiting)   . Type II diabetes mellitus without mention of complication     Past Surgical History:  Procedure Laterality Date  . COLON RESECTION  2007   colon cancer  . STERIOD INJECTION Left 08/04/2012   Procedure: STEROID INJECTION;  Surgeon: Gearlean Alf, MD;  Location: [facility];  Service: Orthopedics;  Laterality: Left;  . TONSILLECTOMY    . TOTAL KNEE ARTHROPLASTY Right 08/04/2012   Procedure: TOTAL KNEE ARTHROPLASTY;  Surgeon: Gearlean Alf, MD;  Location: [facility];  Service: Orthopedics;  Laterality: Right;  . TUBAL LIGATION      FAMHx:  Family History  Problem Relation Age of Onset  . Lung cancer Mother 28  . Kidney failure Father 60  . Diabetes Sister   . Diabetes Brother   . Brain cancer Daughter     SOCHx:   reports that she quit smoking about 47 years ago. She has never used smokeless tobacco. She reports that she does not drink alcohol or use drugs.  ALLERGIES:  Allergies  Allergen  Reactions  . Lisinopril Swelling  . Morphine And Related     Nauseated   . Oxycodone Nausea Only    ROS: Noted in current hx. Otherwise negative   HOME MEDS: Current Outpatient Medications  Medication Sig Dispense Refill  . amLODipine (NORVASC) 10 MG tablet TAKE 1 TABLET BY MOUTH DAILY 90 tablet 1  . atorvastatin (LIPITOR) 20 MG tablet TAKE 1 TABLET BY MOUTH DAILY FOR CHOLESTEROL 90 tablet 1  . Calcium Carb-Cholecalciferol (CALCIUM+D3 PO) Take 1 tablet by mouth daily.    Marland Kitchen ELIQUIS 5 MG TABS tablet TAKE 1 TABLET BY MOUTH TWICE DAILY 60 tablet 5  . glipiZIDE (GLUCOTROL) 10 MG tablet TAKE 1 TABLET BY MOUTH TWICE A DAY 180 tablet 1  . hydrochlorothiazide (HYDRODIURIL) 25 MG tablet TAKE 1 TABLET BY MOUTH DAILY FOR HIGH BLOOD PRESSURE 90 tablet 1  . metFORMIN (GLUCOPHAGE-XR) 500 MG 24 hr tablet TAKE 2 TABLETS BY MOUTH TWICE DAILY 360 tablet 1  . metoprolol succinate (TOPROL-XL) 100 MG 24 hr tablet TAKE 1 TABLET BY MOUTH  DAILY WITH A MEAL 90 tablet 3  . Multiple Vitamin (MULTIVITAMIN WITH MINERALS) TABS tablet Take 1 tablet by mouth daily.    . Multiple Vitamins-Minerals (EYE VITAMINS & MINERALS PO) Take 2 capsules by mouth daily.    . ONE TOUCH ULTRA TEST test strip USE AS DIRECTED TO TEST BLOOD SUGAR 100 each 2  . Propylene Glycol (SYSTANE BALANCE) 0.6 % SOLN Place 1 drop into both eyes daily as needed (for dry eyes).    . TRADJENTA 5 MG TABS tablet TAKE 1 TABLET BY MOUTH DAILY FOR DIABETES 90 tablet 3   No current facility-administered medications for this visit.    LABS/IMAGING: No results found for this or any previous visit (from the past 48 hour(s)). No results found.  VITALS: BP (!) 148/68   Pulse 84   Ht 5' 5.5" (1.664 m)   Wt 194 lb (88 kg)   SpO2 94%   BMI 31.79 kg/m   Physical Exam: Blood pressure (!) 148/68, pulse 84, height 5' 5.5" (1.664 m), weight 194 lb (88 kg), SpO2 94 %.  GEN:  Well nourished, well developed in no acute distress HEENT: Normal NECK: No JVD; No carotid bruits LYMPHATICS: No lymphadenopathy CARDIAC: RRR , no murmurs, rubs, gallops RESPIRATORY:  Clear to auscultation without rales, wheezing or rhonchi  ABDOMEN: Soft, non-tender, non-distended MUSCULOSKELETAL:  No edema; No deformity  SKIN: Warm and dry NEUROLOGIC:  Alert and oriented x 3    EKG:    September 22, 2019:   NSR at 84.   First degree AV block    NS ST / T wave changes   Assessment/plan 1. Paroxysmal atrial fibrillation.     Her CHADS2VASC score is 4 -   She continues to have rare palpitations.   Cont. eliquis     2. Hypertension:    Advised her to avid salty foods.     3. Type 2 diabetes mellitus with hypertension:  Further management per primary MD     Mertie Moores, MD  09/22/2019 3:02 PM    [facility] [place],  [place], [place]  [address] Pager [phone] Phone: [phone]; Fax: [phone]

## 2019-09-22 NOTE — Patient Instructions (Addendum)
Medication Instructions:  Your physician recommends that you continue on your current medications as directed. Please refer to the Current Medication list given to you today.  *If you need a refill on your cardiac medications before your next appointment, please call your pharmacy*   Lab Work: None Ordered If you have labs (blood work) drawn today and your tests are completely normal, you will receive your results only by: Marland Kitchen MyChart Message (if you have MyChart) OR . A paper copy in the mail If you have any lab test that is abnormal or we need to change your treatment, we will call you to review the results.   Testing/Procedures: None Ordered   Follow-Up: At [facility], you and your health needs are our priority.  As part of our continuing mission to provide you with exceptional heart care, we have created designated Provider Care Teams.  These Care Teams include your primary Cardiologist (physician) and Advanced Practice Providers (APPs -  Physician Assistants and Nurse Practitioners) who all work together to provide you with the care you need, when you need it.  We recommend signing up for the patient portal called "MyChart".  Sign up information is provided on this After Visit Summary.  MyChart is used to connect with patients for Virtual Visits (Telemedicine).  Patients are able to view lab/test results, encounter notes, upcoming appointments, etc.  Non-urgent messages can be sent to your provider as well.   To learn more about what you can do with MyChart, go to [email or web address].    Your next appointment:   1 year(s)  The format for your next appointment:   In Person  Provider:   You may see Mertie Moores, MD or one of the following Advanced Practice Providers on your designated Care Team:    Richardson Dopp, PA-C  [place], [place]    [place] stands for "Dietary Approaches to Stop Hypertension." The DASH eating plan is a healthy eating plan that has  been shown to reduce high blood pressure (hypertension). It may also reduce your risk for type 2 diabetes, heart disease, and stroke. The DASH eating plan may also help with weight loss. What are tips for following this plan?  General guidelines  Avoid eating more than 2,300 mg (milligrams) of salt (sodium) a day. If you have hypertension, you may need to reduce your sodium intake to 1,500 mg a day.  Limit alcohol intake to no more than 1 drink a day for nonpregnant women and 2 drinks a day for men. One drink equals 12 oz of beer, 5 oz of wine, or 1 oz of hard liquor.  Work with your health care provider to maintain a healthy body weight or to lose weight. Ask what an ideal weight is for you.  Get at least 30 minutes of exercise that causes your heart to beat faster (aerobic exercise) most days of the week. Activities may include walking, swimming, or biking.  Work with your health care provider or diet and nutrition specialist (dietitian) to adjust your eating plan to your individual calorie needs. Reading food labels   Check food labels for the amount of sodium per serving. Choose foods with less than 5 percent of the Daily Value of sodium. Generally, foods with less than 300 mg of sodium per serving fit into this eating plan.  To find whole grains, look for the word "whole" as the first word in the ingredient list. Shopping  Buy products labeled as "low-sodium" or "no salt  added."  Buy fresh foods. Avoid canned foods and premade or frozen meals. Cooking  Avoid adding salt when cooking. Use salt-free seasonings or herbs instead of table salt or sea salt. Check with your health care provider or pharmacist before using salt substitutes.  Do not fry foods. Cook foods using healthy methods such as baking, boiling, grilling, and broiling instead.  Cook with heart-healthy oils, such as olive, canola, soybean, or sunflower oil. Meal planning  Eat a balanced diet that includes: ? 5 or  more servings of fruits and vegetables each day. At each meal, try to fill half of your plate with fruits and vegetables. ? Up to 6-8 servings of whole grains each day. ? Less than 6 oz of lean meat, poultry, or fish each day. A 3-oz serving of meat is about the same size as a deck of cards. One egg equals 1 oz. ? 2 servings of low-fat dairy each day. ? A serving of nuts, seeds, or beans 5 times each week. ? Heart-healthy fats. Healthy fats called Omega-3 fatty acids are found in foods such as flaxseeds and coldwater fish, like sardines, salmon, and mackerel.  Limit how much you eat of the following: ? Canned or prepackaged foods. ? Food that is high in trans fat, such as fried foods. ? Food that is high in saturated fat, such as fatty meat. ? Sweets, desserts, sugary drinks, and other foods with added sugar. ? Full-fat dairy products.  Do not salt foods before eating.  Try to eat at least 2 vegetarian meals each week.  Eat more home-cooked food and less restaurant, buffet, and fast food.  When eating at a restaurant, ask that your food be prepared with less salt or no salt, if possible. What foods are recommended? The items listed may not be a complete list. Talk with your dietitian about what dietary choices are best for you. Grains Whole-grain or whole-wheat bread. Whole-grain or whole-wheat pasta. Brown rice. [place]. Bulgur. Whole-grain and low-sodium cereals. Pita bread. Low-fat, low-sodium crackers. Whole-wheat flour tortillas. Vegetables Fresh or frozen vegetables (raw, steamed, roasted, or grilled). Low-sodium or reduced-sodium tomato and vegetable juice. Low-sodium or reduced-sodium tomato sauce and tomato paste. Low-sodium or reduced-sodium canned vegetables. Fruits All fresh, dried, or frozen fruit. Canned fruit in natural juice (without added sugar). Meat and other protein foods Skinless chicken or [place]. Ground chicken or [place]. Pork with fat trimmed off. Fish  and seafood. Egg whites. Dried beans, peas, or lentils. Unsalted nuts, nut butters, and seeds. Unsalted canned beans. Lean cuts of beef with fat trimmed off. Low-sodium, lean deli meat. Dairy Low-fat (1%) or fat-free (skim) milk. Fat-free, low-fat, or reduced-fat cheeses. Nonfat, low-sodium ricotta or cottage cheese. Low-fat or nonfat yogurt. Low-fat, low-sodium cheese. Fats and oils Soft margarine without trans fats. Vegetable oil. Low-fat, reduced-fat, or light mayonnaise and salad dressings (reduced-sodium). Canola, safflower, olive, soybean, and sunflower oils. Avocado. Seasoning and other foods Herbs. Spices. Seasoning mixes without salt. Unsalted popcorn and pretzels. Fat-free sweets. What foods are not recommended? The items listed may not be a complete list. Talk with your dietitian about what dietary choices are best for you. Grains Baked goods made with fat, such as croissants, muffins, or some breads. Dry pasta or rice meal packs. Vegetables Creamed or fried vegetables. Vegetables in a cheese sauce. Regular canned vegetables (not low-sodium or reduced-sodium). Regular canned tomato sauce and paste (not low-sodium or reduced-sodium). Regular tomato and vegetable juice (not low-sodium or reduced-sodium). Angie Fava. Olives. Fruits Canned fruit  in a light or heavy syrup. Fried fruit. Fruit in cream or butter sauce. Meat and other protein foods Fatty cuts of meat. Ribs. Fried meat. Berniece Salines. Sausage. Bologna and other processed lunch meats. Salami. Fatback. Hotdogs. Bratwurst. Salted nuts and seeds. Canned beans with added salt. Canned or smoked fish. Whole eggs or egg yolks. Chicken or [place] with skin. Dairy Whole or 2% milk, cream, and half-and-half. Whole or full-fat cream cheese. Whole-fat or sweetened yogurt. Full-fat cheese. Nondairy creamers. Whipped toppings. Processed cheese and cheese spreads. Fats and oils Butter. Stick margarine. Lard. Shortening. Ghee. Bacon fat. Tropical oils,  such as coconut, palm kernel, or palm oil. Seasoning and other foods Salted popcorn and pretzels. Onion salt, garlic salt, seasoned salt, table salt, and sea salt. Worcestershire sauce. Tartar sauce. Barbecue sauce. Teriyaki sauce. Soy sauce, including reduced-sodium. Steak sauce. Canned and packaged gravies. Fish sauce. Oyster sauce. Cocktail sauce. Horseradish that you find on the shelf. Ketchup. Mustard. Meat flavorings and tenderizers. Bouillon cubes. Hot sauce and Tabasco sauce. Premade or packaged marinades. Premade or packaged taco seasonings. Relishes. Regular salad dressings. Where to find more information:  National Heart, Lung, and [place]: https://wilson-eaton.com/  American Heart Association: www.heart.org Summary  The DASH eating plan is a healthy eating plan that has been shown to reduce high blood pressure (hypertension). It may also reduce your risk for type 2 diabetes, heart disease, and stroke.  With the DASH eating plan, you should limit salt (sodium) intake to 2,300 mg a day. If you have hypertension, you may need to reduce your sodium intake to 1,500 mg a day.  When on the DASH eating plan, aim to eat more fresh fruits and vegetables, whole grains, lean proteins, low-fat dairy, and heart-healthy fats.  Work with your health care provider or diet and nutrition specialist (dietitian) to adjust your eating plan to your individual calorie needs. This information is not intended to replace advice given to you by your health care provider. Make sure you discuss any questions you have with your health care provider. Document Revised: 03/15/2017 Document Reviewed: 03/26/2016 Elsevier Patient Education  2020 [employer].

## 2019-09-25 DIAGNOSIS — M1712 Unilateral primary osteoarthritis, left knee: Secondary | ICD-10-CM | POA: Diagnosis not present

## 2019-10-01 DIAGNOSIS — M1712 Unilateral primary osteoarthritis, left knee: Secondary | ICD-10-CM | POA: Diagnosis not present

## 2019-10-07 ENCOUNTER — Telehealth: Payer: Self-pay | Admitting: *Deleted

## 2019-10-07 NOTE — Telephone Encounter (Signed)
i'm working the [facility] list and pt is due for her DM eye exam. Pt said she hasn't had once since 2019 due to Covid. I asked pt if we could help get an appt scheduled and pt said not right now. She will get one done "before the years over" but declined to schedule one now.  FYI to PCP

## 2019-10-07 NOTE — Telephone Encounter (Signed)
Noted  

## 2019-10-08 DIAGNOSIS — M1712 Unilateral primary osteoarthritis, left knee: Secondary | ICD-10-CM | POA: Diagnosis not present

## 2019-10-26 ENCOUNTER — Other Ambulatory Visit: Payer: Self-pay | Admitting: Primary Care

## 2019-10-26 DIAGNOSIS — E1140 Type 2 diabetes mellitus with diabetic neuropathy, unspecified: Secondary | ICD-10-CM

## 2019-10-26 DIAGNOSIS — E785 Hyperlipidemia, unspecified: Secondary | ICD-10-CM

## 2020-01-26 ENCOUNTER — Other Ambulatory Visit: Payer: Self-pay | Admitting: Primary Care

## 2020-01-26 ENCOUNTER — Ambulatory Visit: Payer: Medicare Other | Admitting: Primary Care

## 2020-01-26 DIAGNOSIS — I10 Essential (primary) hypertension: Secondary | ICD-10-CM

## 2020-02-23 ENCOUNTER — Ambulatory Visit (INDEPENDENT_AMBULATORY_CARE_PROVIDER_SITE_OTHER): Payer: Medicare Other | Admitting: Primary Care

## 2020-02-23 ENCOUNTER — Encounter: Payer: Self-pay | Admitting: Primary Care

## 2020-02-23 ENCOUNTER — Other Ambulatory Visit: Payer: Self-pay

## 2020-02-23 VITALS — BP 126/62 | HR 83 | Temp 97.4°F | Ht 65.5 in | Wt 199.0 lb

## 2020-02-23 DIAGNOSIS — E1140 Type 2 diabetes mellitus with diabetic neuropathy, unspecified: Secondary | ICD-10-CM

## 2020-02-23 DIAGNOSIS — Z23 Encounter for immunization: Secondary | ICD-10-CM | POA: Diagnosis not present

## 2020-02-23 LAB — POCT GLYCOSYLATED HEMOGLOBIN (HGB A1C): Hemoglobin A1C: 8.3 % — AB (ref 4.0–5.6)

## 2020-02-23 NOTE — Progress Notes (Signed)
Subjective:    Patient ID: Carly Buckley, female    DOB: [birth year], 74 y.o.   MRN: [id number]  HPI   This visit occurred during the SARS-CoV-2 public health emergency.  Safety protocols were in place, including screening questions prior to the visit, additional usage of staff PPE, and extensive cleaning of exam room while observing appropriate contact time as indicated for disinfecting solutions.   Carly Buckley is a 74 year old female with history of hypertension, Afib, type 2 diabetes mellitus, osteoarthritis & hyperlipidemia who presents today to follow up on her diabetes.   Current medications include: Metformin, glipizide & Tradjenta She has been complaint to these mediations.   She is not checking her blood glucose at home she does have a monitor at home but does not recall the last time she took her blood sugar.   Last A1C: 7.5 in April 2021 today 8.3  Last Eye Exam: last exam 2019, appointment in January  Last Foot Exam: Completed today  Pneumonia Vaccination: Completed  ACE/ARB: she is on a CCB & diuretic  Statin: Lipitor   Diet currently consists of: She reports a fair diet , cooks most of meals. Avoids most sweets and breads.    Exercise: Walks a mile a day around her church    Lab Results  Component Value Date   HGBA1C 8.3 (A) 02/23/2020   BP Readings from Last 3 Encounters:  02/23/20 126/62  09/22/19 126/70  07/27/19 134/87    Review of Systems  Constitutional: Negative.   HENT: Negative.   Respiratory: Negative.  Negative for chest tightness and shortness of breath.   Cardiovascular: Negative.   Endocrine: Negative.   Musculoskeletal: Negative.   Skin: Negative.   Neurological: Negative.  Negative for dizziness and headaches.   Past Medical History:  Diagnosis Date  . Angioedema   . Arthritis   . Colon cancer ([place])    colon ca dx 07, colon resection  . Complication of anesthesia   . Diabetes mellitus   . Diverticulitis   . Dysrhythmia     palpitations  . Helicobacter pylori (H. pylori)   . Hypertension   . Malignant neoplasm of colon   . Neuropathy, idiopathic   . PONV (postoperative nausea and vomiting)   . Type II diabetes mellitus without mention of complication      Social History   Socioeconomic History  . Marital status: Married    Spouse name: Not on file  . Number of children: Not on file  . Years of education: Not on file  . Highest education level: Not on file  Occupational History  . Not on file  Tobacco Use  . Smoking status: Former Smoker    Quit date: 06/18/1972    Years since quitting: 47.7  . Smokeless tobacco: Never Used  Vaping Use  . Vaping Use: Never used  Substance and Sexual Activity  . Alcohol use: No  . Drug use: No  . Sexual activity: Not Currently  Other Topics Concern  . Not on file  Social History Narrative   Married.   4 children, 7 grandchildren, 3 great grandchildren.   Retired. Once worked at [employer]	[place].   Enjoys walking, puzzle books, spending time with family.   Social Determinants of Health   Financial Resource Strain: Low Risk   . Difficulty of Paying Living Expenses: Not hard at all  Food Insecurity: No Food Insecurity  . Worried About Charity fundraiser in the Last Year:  Never true  . Ran Out of Food in the Last Year: Never true  Transportation Needs: No Transportation Needs  . Lack of Transportation (Medical): No  . Lack of Transportation (Non-Medical): No  Physical Activity: Sufficiently Active  . Days of Exercise per Week: 7 days  . Minutes of Exercise per Session: 30 min  Stress: No Stress Concern Present  . Feeling of Stress : Not at all  Social Connections:   . Frequency of Communication with Friends and Family: Not on file  . Frequency of Social Gatherings with Friends and Family: Not on file  . Attends Religious Services: Not on file  . Active Member of Clubs or Organizations: Not on file  . Attends Archivist Meetings: Not on  file  . Marital Status: Not on file  Intimate Partner Violence: Not At Risk  . Fear of Current or Ex-Partner: No  . Emotionally Abused: No  . Physically Abused: No  . Sexually Abused: No    Past Surgical History:  Procedure Laterality Date  . COLON RESECTION  2007   colon cancer  . STERIOD INJECTION Left 08/04/2012   Procedure: STEROID INJECTION;  Surgeon: Gearlean Alf, MD;  Location: [facility];  Service: Orthopedics;  Laterality: Left;  . TONSILLECTOMY    . TOTAL KNEE ARTHROPLASTY Right 08/04/2012   Procedure: TOTAL KNEE ARTHROPLASTY;  Surgeon: Gearlean Alf, MD;  Location: [facility];  Service: Orthopedics;  Laterality: Right;  . TUBAL LIGATION      Family History  Problem Relation Age of Onset  . Lung cancer Mother 52  . Kidney failure Father 77  . Diabetes Sister   . Diabetes Brother   . Brain cancer Daughter     Allergies  Allergen Reactions  . Lisinopril Swelling  . Morphine And Related     Nauseated   . Oxycodone Nausea Only    Current Outpatient Medications on File Prior to Visit  Medication Sig Dispense Refill  . amLODipine (NORVASC) 10 MG tablet TAKE 1 TABLET BY MOUTH DAILY 90 tablet 1  . atorvastatin (LIPITOR) 20 MG tablet TAKE 1 TABLET BY MOUTH DAILY FOR CHOLESTEROL 90 tablet 1  . Calcium Carb-Cholecalciferol (CALCIUM+D3 PO) Take 1 tablet by mouth daily.    Marland Kitchen ELIQUIS 5 MG TABS tablet TAKE 1 TABLET BY MOUTH TWICE DAILY 60 tablet 5  . glipiZIDE (GLUCOTROL) 10 MG tablet TAKE 1 TABLET BY MOUTH TWICE A DAY 180 tablet 1  . hydrochlorothiazide (HYDRODIURIL) 25 MG tablet TAKE 1 TABLET BY MOUTH DAILY FOR HIGH BLOOD PRESSURE 90 tablet 1  . metFORMIN (GLUCOPHAGE-XR) 500 MG 24 hr tablet TAKE 2 TABLETS BY MOUTH TWICE DAILY 360 tablet 1  . metoprolol succinate (TOPROL-XL) 100 MG 24 hr tablet TAKE 1 TABLET BY MOUTH DAILY WITH A MEAL 90 tablet 3  . Multiple Vitamin (MULTIVITAMIN WITH MINERALS) TABS tablet Take 1 tablet by mouth daily.    . Multiple Vitamins-Minerals (EYE  VITAMINS & MINERALS PO) Take 2 capsules by mouth daily.    . ONE TOUCH ULTRA TEST test strip USE AS DIRECTED TO TEST BLOOD SUGAR 100 each 2  . Propylene Glycol (SYSTANE BALANCE) 0.6 % SOLN Place 1 drop into both eyes daily as needed (for dry eyes).    . TRADJENTA 5 MG TABS tablet TAKE 1 TABLET BY MOUTH DAILY FOR DIABETES 90 tablet 3   No current facility-administered medications on file prior to visit.    BP 126/62   Pulse 83   Temp (!) 97.4  F (36.3 C) (Temporal)   Ht 5' 5.5" (1.664 m)   Wt 199 lb (90.3 kg)   SpO2 97%   BMI 32.61 kg/m       Objective:   Physical Exam Constitutional:      Appearance: Normal appearance.  Cardiovascular:     Pulses: Normal pulses.     Heart sounds: Normal heart sounds.  Pulmonary:     Effort: Pulmonary effort is normal.     Breath sounds: Normal breath sounds.  Musculoskeletal:        General: Normal range of motion.  Skin:    General: Skin is warm and dry.     Capillary Refill: Capillary refill takes less than 2 seconds.  Neurological:     General: No focal deficit present.     Mental Status: She is alert.             Assessment & Plan:

## 2020-02-23 NOTE — Progress Notes (Signed)
Subjective:    Patient ID: Carly Buckley, female    DOB: [birth year], 74 y.o.   MRN: [id number]  HPI  This visit occurred during the SARS-CoV-2 public health emergency.  Safety protocols were in place, including screening questions prior to the visit, additional usage of staff PPE, and extensive cleaning of exam room while observing appropriate contact time as indicated for disinfecting solutions.   Carly Buckley is a 74 year old female with a history of hypertension, atrial fibrillation, type 2 diabetes, hyperlipidemia who presents today for follow up of diabetes.  Current medications include: Tradjenta 5 mg, Glipizide 10 mg BID, Metformin XR 1000 mg BID. She is compliant to all medications.   She is checking her blood glucose 0 times daily. She has been working on her diet, is limiting breads and sweets, walks one mile daily around her church.   Last A1C: 7.5 in April 2021, 8.3 today Last Eye Exam: Scheduled for January  Last Foot Exam: UTD Pneumonia Vaccination: Completed in 2018 ACE/ARB: None, urine microalbumin  Statin: Lipitor   BP Readings from Last 3 Encounters:  02/23/20 126/62  09/22/19 126/70  07/27/19 134/87     Review of Systems  Eyes: Negative for visual disturbance.  Respiratory: Negative for shortness of breath.   Cardiovascular: Negative for chest pain.  Neurological: Negative for dizziness and headaches.       Past Medical History:  Diagnosis Date  . Angioedema   . Arthritis   . Colon cancer ([place])    colon ca dx 07, colon resection  . Complication of anesthesia   . Diabetes mellitus   . Diverticulitis   . Dysrhythmia    palpitations  . Helicobacter pylori (H. pylori)   . Hypertension   . Malignant neoplasm of colon   . Neuropathy, idiopathic   . PONV (postoperative nausea and vomiting)   . Type II diabetes mellitus without mention of complication      Social History   Socioeconomic History  . Marital status: Married    Spouse name: Not on file   . Number of children: Not on file  . Years of education: Not on file  . Highest education level: Not on file  Occupational History  . Not on file  Tobacco Use  . Smoking status: Former Smoker    Quit date: 06/18/1972    Years since quitting: 47.7  . Smokeless tobacco: Never Used  Vaping Use  . Vaping Use: Never used  Substance and Sexual Activity  . Alcohol use: No  . Drug use: No  . Sexual activity: Not Currently  Other Topics Concern  . Not on file  Social History Narrative   Married.   4 children, 7 grandchildren, 3 great grandchildren.   Retired. Once worked at [employer]	[place].   Enjoys walking, puzzle books, spending time with family.   Social Determinants of Health   Financial Resource Strain: Low Risk   . Difficulty of Paying Living Expenses: Not hard at all  Food Insecurity: No Food Insecurity  . Worried About Charity fundraiser in the Last Year: Never true  . Ran Out of Food in the Last Year: Never true  Transportation Needs: No Transportation Needs  . Lack of Transportation (Medical): No  . Lack of Transportation (Non-Medical): No  Physical Activity: Sufficiently Active  . Days of Exercise per Week: 7 days  . Minutes of Exercise per Session: 30 min  Stress: No Stress Concern Present  . Feeling of Stress :  Not at all  Social Connections:   . Frequency of Communication with Friends and Family: Not on file  . Frequency of Social Gatherings with Friends and Family: Not on file  . Attends Religious Services: Not on file  . Active Member of Clubs or Organizations: Not on file  . Attends Archivist Meetings: Not on file  . Marital Status: Not on file  Intimate Partner Violence: Not At Risk  . Fear of Current or Ex-Partner: No  . Emotionally Abused: No  . Physically Abused: No  . Sexually Abused: No    Past Surgical History:  Procedure Laterality Date  . COLON RESECTION  2007   colon cancer  . STERIOD INJECTION Left 08/04/2012   Procedure:  STEROID INJECTION;  Surgeon: Gearlean Alf, MD;  Location: [facility];  Service: Orthopedics;  Laterality: Left;  . TONSILLECTOMY    . TOTAL KNEE ARTHROPLASTY Right 08/04/2012   Procedure: TOTAL KNEE ARTHROPLASTY;  Surgeon: Gearlean Alf, MD;  Location: [facility];  Service: Orthopedics;  Laterality: Right;  . TUBAL LIGATION      Family History  Problem Relation Age of Onset  . Lung cancer Mother 58  . Kidney failure Father 28  . Diabetes Sister   . Diabetes Brother   . Brain cancer Daughter     Allergies  Allergen Reactions  . Lisinopril Swelling  . Morphine And Related     Nauseated   . Oxycodone Nausea Only    Current Outpatient Medications on File Prior to Visit  Medication Sig Dispense Refill  . amLODipine (NORVASC) 10 MG tablet TAKE 1 TABLET BY MOUTH DAILY 90 tablet 1  . atorvastatin (LIPITOR) 20 MG tablet TAKE 1 TABLET BY MOUTH DAILY FOR CHOLESTEROL 90 tablet 1  . Calcium Carb-Cholecalciferol (CALCIUM+D3 PO) Take 1 tablet by mouth daily.    Marland Kitchen ELIQUIS 5 MG TABS tablet TAKE 1 TABLET BY MOUTH TWICE DAILY 60 tablet 5  . glipiZIDE (GLUCOTROL) 10 MG tablet TAKE 1 TABLET BY MOUTH TWICE A DAY 180 tablet 1  . hydrochlorothiazide (HYDRODIURIL) 25 MG tablet TAKE 1 TABLET BY MOUTH DAILY FOR HIGH BLOOD PRESSURE 90 tablet 1  . metFORMIN (GLUCOPHAGE-XR) 500 MG 24 hr tablet TAKE 2 TABLETS BY MOUTH TWICE DAILY 360 tablet 1  . metoprolol succinate (TOPROL-XL) 100 MG 24 hr tablet TAKE 1 TABLET BY MOUTH DAILY WITH A MEAL 90 tablet 3  . Multiple Vitamin (MULTIVITAMIN WITH MINERALS) TABS tablet Take 1 tablet by mouth daily.    . Multiple Vitamins-Minerals (EYE VITAMINS & MINERALS PO) Take 2 capsules by mouth daily.    . ONE TOUCH ULTRA TEST test strip USE AS DIRECTED TO TEST BLOOD SUGAR 100 each 2  . Propylene Glycol (SYSTANE BALANCE) 0.6 % SOLN Place 1 drop into both eyes daily as needed (for dry eyes).    . TRADJENTA 5 MG TABS tablet TAKE 1 TABLET BY MOUTH DAILY FOR DIABETES 90 tablet 3   No  current facility-administered medications on file prior to visit.    BP 126/62   Pulse 83   Temp (!) 97.4 F (36.3 C) (Temporal)   Ht 5' 5.5" (1.664 m)   Wt 199 lb (90.3 kg)   SpO2 97%   BMI 32.61 kg/m    Objective:   Physical Exam Cardiovascular:     Rate and Rhythm: Normal rate and regular rhythm.  Pulmonary:     Effort: Pulmonary effort is normal.     Breath sounds: Normal breath sounds.  Musculoskeletal:     Cervical back: Neck supple.  Skin:    General: Skin is warm and dry.            Assessment & Plan:

## 2020-02-23 NOTE — Patient Instructions (Addendum)
Start checking your blood sugar levels.  Appropriate times to check your blood sugar levels are:  -Before any meal (breakfast, lunch, dinner) -Two hours after any meal (breakfast, lunch, dinner) -Bedtime  Record your readings and notify me if you continue to consistently run at or above 150-200   It is important that you improve your diet. Please limit carbohydrates in the form of white bread, rice, pasta, sweets, fast food, fried food, sugary drinks, etc. Increase your consumption of fresh fruits and vegetables, whole grains, lean protein.  Ensure you are consuming 64 ounces of water daily.  Continue your daily walks   Influenza (Flu) Vaccine (Inactivated or Recombinant): What You Need to Know 1. Why get vaccinated? Influenza vaccine can prevent influenza (flu). Flu is a contagious disease that spreads around the [place] every year, usually between October and May. Anyone can get the flu, but it is more dangerous for some people. Infants and young children, people 7 years of age and older, pregnant women, and people with certain health conditions or a weakened immune system are at greatest risk of flu complications. Pneumonia, bronchitis, sinus infections and ear infections are examples of flu-related complications. If you have a medical condition, such as heart disease, cancer or diabetes, flu can make it worse. Flu can cause fever and chills, sore throat, muscle aches, fatigue, cough, headache, and runny or stuffy nose. Some people may have vomiting and diarrhea, though this is more common in children than adults. Each year thousands of people in the [place] die from flu, and many more are hospitalized. Flu vaccine prevents millions of illnesses and flu-related visits to the doctor each year. 2. Influenza vaccine CDC recommends everyone 40 months of age and older get vaccinated every flu season. Children 6 months through 50 years of age may need 2 doses during a single flu  season. Everyone else needs only 1 dose each flu season. It takes about 2 weeks for protection to develop after vaccination. There are many flu viruses, and they are always changing. Each year a new flu vaccine is made to protect against three or four viruses that are likely to cause disease in the upcoming flu season. Even when the vaccine doesn't exactly match these viruses, it may still provide some protection. Influenza vaccine does not cause flu. Influenza vaccine may be given at the same time as other vaccines. 3. Talk with your health care provider Tell your vaccine provider if the person getting the vaccine:  Has had an allergic reaction after a previous dose of influenza vaccine, or has any severe, life-threatening allergies.  Has ever had Guillain-Barr Syndrome (also called GBS). In some cases, your health care provider may decide to postpone influenza vaccination to a future visit. People with minor illnesses, such as a cold, may be vaccinated. People who are moderately or severely ill should usually wait until they recover before getting influenza vaccine. Your health care provider can give you more information. 4. Risks of a vaccine reaction  Soreness, redness, and swelling where shot is given, fever, muscle aches, and headache can happen after influenza vaccine.  There may be a very small increased risk of Guillain-Barr Syndrome (GBS) after inactivated influenza vaccine (the flu shot). Young children who get the flu shot along with pneumococcal vaccine (PCV13), and/or DTaP vaccine at the same time might be slightly more likely to have a seizure caused by fever. Tell your health care provider if a child who is getting flu vaccine has ever had  a seizure. People sometimes faint after medical procedures, including vaccination. Tell your provider if you feel dizzy or have vision changes or ringing in the ears. As with any medicine, there is a very remote chance of a vaccine causing a  severe allergic reaction, other serious injury, or death. 5. What if there is a serious problem? An allergic reaction could occur after the vaccinated person leaves the clinic. If you see signs of a severe allergic reaction (hives, swelling of the face and throat, difficulty breathing, a fast heartbeat, dizziness, or weakness), call 9-1-1 and get the person to the nearest hospital. For other signs that concern you, call your health care provider. Adverse reactions should be reported to the Vaccine Adverse Event Reporting System (VAERS). Your health care provider will usually file this report, or you can do it yourself. Visit the VAERS website at www.vaers.SamedayNews.es or call (317)135-9394.VAERS is only for reporting reactions, and VAERS staff do not give medical advice. 6. The National Vaccine Injury Compensation Program The [employer] Vaccine Injury Compensation Program (VICP) is a federal program that was created to compensate people who may have been injured by certain vaccines. Visit the VICP website at GoldCloset.com.ee or call 806-353-2757 to learn about the program and about filing a claim. There is a time limit to file a claim for compensation. 7. How can I learn more?  Ask your healthcare provider.  Call your local or state health department.  Contact the Centers for Disease Control and Prevention (CDC): ? Call 410-789-8255 (1-800-CDC-INFO) or ? Visit CDC's https://gibson.com/ Vaccine Information Statement (Interim) Inactivated Influenza Vaccine (11/28/2017) This information is not intended to replace advice given to you by your health care provider. Make sure you discuss any questions you have with your health care provider. Document Revised: 07/22/2018 Document Reviewed: 12/02/2017 Elsevier Patient Education  [place].

## 2020-02-23 NOTE — Assessment & Plan Note (Addendum)
A1C today increased from 7.5 to 8.3 Continue Metformin, glipizide & Tradjenta  She would like to work on lifestyle changes. Continue walking daily and working on diet. She has appointment for eye exam January 2022. Pneumonia vaccine is UTD Urine microalbumin: UTD Managed on statin.  Foot exam: Updated today She will begin taking blood sugars at home and will update if readings are consisently above 150-200.  Will recheck A1c in 3 months.   Agree with assessment and plan. Pleas Koch, NP

## 2020-02-25 ENCOUNTER — Telehealth: Payer: Self-pay | Admitting: Primary Care

## 2020-02-25 NOTE — Progress Notes (Signed)
  Chronic Care Management   Outreach Note  02/25/2020 Name: Carly Buckley MRN: [id number] DOB: 1946/02/11  Referred by: Pleas Koch, NP Reason for referral : Chronic Care Management   An unsuccessful telephone outreach was attempted today. The patient was referred to the pharmacist for assistance with care management and care coordination.   Follow Up Plan:   Hilario Quarry  Upstream Scheduler

## 2020-04-11 ENCOUNTER — Telehealth: Payer: Self-pay | Admitting: Primary Care

## 2020-04-11 NOTE — Progress Notes (Signed)
  Chronic Care Management   Outreach Note  04/11/2020 Name: Carly Buckley MRN: [id number] DOB: 28-Dec-1945  Referred by: Doreene Nest, NP Reason for referral : Chronic Care Management   A second unsuccessful telephone outreach was attempted today. The patient was referred to pharmacist for assistance with care management and care coordination.  Follow Up Plan:   Aggie Hacker  Upstream Scheduler

## 2020-04-12 ENCOUNTER — Other Ambulatory Visit: Payer: Self-pay

## 2020-04-12 MED ORDER — APIXABAN 5 MG PO TABS: 5.0000 mg | ORAL_TABLET | Freq: Two times a day (BID) | ORAL | 5 refills | Status: DC

## 2020-04-12 NOTE — Telephone Encounter (Signed)
Pt last saw Dr Elease Hashimoto 09/22/19, last labs 06/12/19 Creat 0.72, age 74, weight 90.3kg, based on specified criteria pt is on appropriate dosage of Eliquis 5mg  BID.  Will refill rx.

## 2020-04-26 ENCOUNTER — Telehealth: Payer: Self-pay | Admitting: Primary Care

## 2020-04-26 NOTE — Progress Notes (Signed)
°  Chronic Care Management   Outreach Note  04/26/2020 Name: Carly Buckley MRN: [id number] DOB: [birth year]  Referred by: Pleas Koch, NP Reason for referral : Chronic Care Management   Third unsuccessful telephone outreach was attempted today. The patient was referred to the pharmacist for assistance with care management and care coordination.   Follow Up Plan:   Hilario Quarry  Upstream Scheduler

## 2020-04-27 DIAGNOSIS — Z961 Presence of intraocular lens: Secondary | ICD-10-CM | POA: Diagnosis not present

## 2020-04-27 DIAGNOSIS — E119 Type 2 diabetes mellitus without complications: Secondary | ICD-10-CM | POA: Diagnosis not present

## 2020-04-27 DIAGNOSIS — H26493 Other secondary cataract, bilateral: Secondary | ICD-10-CM | POA: Diagnosis not present

## 2020-04-27 DIAGNOSIS — Z9889 Other specified postprocedural states: Secondary | ICD-10-CM | POA: Diagnosis not present

## 2020-04-27 DIAGNOSIS — H5213 Myopia, bilateral: Secondary | ICD-10-CM | POA: Diagnosis not present

## 2020-04-27 LAB — HM DIABETES EYE EXAM

## 2020-04-28 DIAGNOSIS — M1712 Unilateral primary osteoarthritis, left knee: Secondary | ICD-10-CM | POA: Diagnosis not present

## 2020-05-03 ENCOUNTER — Other Ambulatory Visit: Payer: Self-pay | Admitting: Primary Care

## 2020-05-03 DIAGNOSIS — I10 Essential (primary) hypertension: Secondary | ICD-10-CM

## 2020-05-04 NOTE — Telephone Encounter (Signed)
Pt has follow up next month will call in 90 day to last until then.

## 2020-05-05 DIAGNOSIS — M1712 Unilateral primary osteoarthritis, left knee: Secondary | ICD-10-CM | POA: Diagnosis not present

## 2020-05-10 ENCOUNTER — Other Ambulatory Visit: Payer: Self-pay | Admitting: Primary Care

## 2020-05-10 DIAGNOSIS — I10 Essential (primary) hypertension: Secondary | ICD-10-CM

## 2020-05-11 NOTE — Telephone Encounter (Signed)
Pharmacy requests refill on: Hydrochlorothiazide 25 mg   LAST REFILL: 01/27/2020 (Q-90, R-1) LAST OV: 02/23/2020 NEXT OV: 05/27/2020 PHARMACY: [facility]

## 2020-05-12 DIAGNOSIS — M1712 Unilateral primary osteoarthritis, left knee: Secondary | ICD-10-CM | POA: Diagnosis not present

## 2020-05-19 ENCOUNTER — Other Ambulatory Visit: Payer: Self-pay | Admitting: Primary Care

## 2020-05-19 DIAGNOSIS — E1140 Type 2 diabetes mellitus with diabetic neuropathy, unspecified: Secondary | ICD-10-CM

## 2020-05-19 NOTE — Telephone Encounter (Signed)
Pharmacy requests refill on: Metformin 500 mg 24 hr   LAST REFILL: 07/23/2019 (Q-360, R-1) LAST OV: 02/23/2020 NEXT OV: 06/08/2020 PHARMACY: [facility]  Hgb A1C (02/23/2020): 8.3

## 2020-05-25 ENCOUNTER — Other Ambulatory Visit: Payer: Self-pay | Admitting: Primary Care

## 2020-05-25 DIAGNOSIS — E1140 Type 2 diabetes mellitus with diabetic neuropathy, unspecified: Secondary | ICD-10-CM

## 2020-05-27 ENCOUNTER — Ambulatory Visit: Payer: Medicare Other | Admitting: Primary Care

## 2020-06-08 ENCOUNTER — Encounter: Payer: Self-pay | Admitting: Primary Care

## 2020-06-08 ENCOUNTER — Ambulatory Visit (INDEPENDENT_AMBULATORY_CARE_PROVIDER_SITE_OTHER): Payer: Medicare Other | Admitting: Primary Care

## 2020-06-08 ENCOUNTER — Other Ambulatory Visit: Payer: Self-pay

## 2020-06-08 VITALS — BP 126/64 | HR 84 | Temp 97.8°F | Wt 194.6 lb

## 2020-06-08 DIAGNOSIS — E1140 Type 2 diabetes mellitus with diabetic neuropathy, unspecified: Secondary | ICD-10-CM

## 2020-06-08 LAB — POCT GLYCOSYLATED HEMOGLOBIN (HGB A1C): Hemoglobin A1C: 7.9 % — AB (ref 4.0–5.6)

## 2020-06-08 MED ORDER — BLOOD GLUCOSE MONITOR KIT
PACK | 0 refills | Status: AC
Start: 2020-06-08 — End: ?

## 2020-06-08 NOTE — Patient Instructions (Signed)
Continue taking your metformin, glipizide, and Tradjenta for diabetes.   Start exercising. You should be getting 150 minutes of moderate intensity exercise weekly.  It is important that you improve your diet. Please limit carbohydrates in the form of white bread, rice, pasta, sweets, fast food, fried food, sugary drinks, etc. Increase your consumption of fresh fruits and vegetables, whole grains, lean protein.  Ensure you are consuming 64 ounces of water daily.  Please schedule a follow up appointment in 6 months for diabetes check.   It was a pleasure to see you today!   Diabetes Mellitus and Nutrition, Adult When you have diabetes, or diabetes mellitus, it is very important to have healthy eating habits because your blood sugar (glucose) levels are greatly affected by what you eat and drink. Eating healthy foods in the right amounts, at about the same times every day, can help you:  Control your blood glucose.  Lower your risk of heart disease.  Improve your blood pressure.  Reach or maintain a healthy weight. What can affect my meal plan? Every person with diabetes is different, and each person has different needs for a meal plan. Your health care provider may recommend that you work with a dietitian to make a meal plan that is best for you. Your meal plan may vary depending on factors such as:  The calories you need.  The medicines you take.  Your weight.  Your blood glucose, blood pressure, and cholesterol levels.  Your activity level.  Other health conditions you have, such as heart or kidney disease. How do carbohydrates affect me? Carbohydrates, also called carbs, affect your blood glucose level more than any other type of food. Eating carbs naturally raises the amount of glucose in your blood. Carb counting is a method for keeping track of how many carbs you eat. Counting carbs is important to keep your blood glucose at a healthy level, especially if you use insulin or  take certain oral diabetes medicines. It is important to know how many carbs you can safely have in each meal. This is different for every person. Your dietitian can help you calculate how many carbs you should have at each meal and for each snack. How does alcohol affect me? Alcohol can cause a sudden decrease in blood glucose (hypoglycemia), especially if you use insulin or take certain oral diabetes medicines. Hypoglycemia can be a life-threatening condition. Symptoms of hypoglycemia, such as sleepiness, dizziness, and confusion, are similar to symptoms of having too much alcohol.  Do not drink alcohol if: ? Your health care provider tells you not to drink. ? You are pregnant, may be pregnant, or are planning to become pregnant.  If you drink alcohol: ? Do not drink on an empty stomach. ? Limit how much you use to:  0-1 drink a day for women.  0-2 drinks a day for men. ? Be aware of how much alcohol is in your drink. In the [place] one drink equals one 12 oz bottle of beer (355 mL), one 5 oz glass of wine (148 mL), or one 1 oz glass of hard liquor (44 mL). ? Keep yourself hydrated with water, diet soda, or unsweetened iced tea.  Keep in mind that regular soda, juice, and other mixers may contain a lot of sugar and must be counted as carbs. What are tips for following this plan? Reading food labels  Start by checking the serving size on the "Nutrition Facts" label of packaged foods and drinks. The amount of  calories, carbs, fats, and other nutrients listed on the label is based on one serving of the item. Many items contain more than one serving per package.  Check the total grams (g) of carbs in one serving. You can calculate the number of servings of carbs in one serving by dividing the total carbs by 15. For example, if a food has 30 g of total carbs per serving, it would be equal to 2 servings of carbs.  Check the number of grams (g) of saturated fats and trans fats in one serving.  Choose foods that have a low amount or none of these fats.  Check the number of milligrams (mg) of salt (sodium) in one serving. Most people should limit total sodium intake to less than 2,300 mg per day.  Always check the nutrition information of foods labeled as "low-fat" or "nonfat." These foods may be higher in added sugar or refined carbs and should be avoided.  Talk to your dietitian to identify your daily goals for nutrients listed on the label. Shopping  Avoid buying canned, pre-made, or processed foods. These foods tend to be high in fat, sodium, and added sugar.  Shop around the outside edge of the grocery store. This is where you will most often find fresh fruits and vegetables, bulk grains, fresh meats, and fresh dairy. Cooking  Use low-heat cooking methods, such as baking, instead of high-heat cooking methods like deep frying.  Cook using healthy oils, such as olive, canola, or sunflower oil.  Avoid cooking with butter, cream, or high-fat meats. Meal planning  Eat meals and snacks regularly, preferably at the same times every day. Avoid going long periods of time without eating.  Eat foods that are high in fiber, such as fresh fruits, vegetables, beans, and whole grains. Talk with your dietitian about how many servings of carbs you can eat at each meal.  Eat 4-6 oz (112-168 g) of lean protein each day, such as lean meat, chicken, fish, eggs, or tofu. One ounce (oz) of lean protein is equal to: ? 1 oz (28 g) of meat, chicken, or fish. ? 1 egg. ?  cup (62 g) of tofu.  Eat some foods each day that contain healthy fats, such as avocado, nuts, seeds, and fish.   What foods should I eat? Fruits Berries. Apples. Oranges. Peaches. Apricots. Plums. Grapes. Mango. Papaya. Pomegranate. Kiwi. Cherries. Vegetables Lettuce. Spinach. Leafy greens, including kale, chard, collard greens, and mustard greens. Beets. Cauliflower. Cabbage. Broccoli. Carrots. Green beans. Tomatoes.  Peppers. Onions. Cucumbers. Brussels sprouts. Grains Whole grains, such as whole-wheat or whole-grain bread, crackers, tortillas, cereal, and pasta. Unsweetened oatmeal. Quinoa. Brown or wild rice. Meats and other proteins Seafood. Poultry without skin. Lean cuts of poultry and beef. Tofu. Nuts. Seeds. Dairy Low-fat or fat-free dairy products such as milk, yogurt, and cheese. The items listed above may not be a complete list of foods and beverages you can eat. Contact a dietitian for more information. What foods should I avoid? Fruits Fruits canned with syrup. Vegetables Canned vegetables. Frozen vegetables with butter or cream sauce. Grains Refined white flour and flour products such as bread, pasta, snack foods, and cereals. Avoid all processed foods. Meats and other proteins Fatty cuts of meat. Poultry with skin. Breaded or fried meats. Processed meat. Avoid saturated fats. Dairy Full-fat yogurt, cheese, or milk. Beverages Sweetened drinks, such as soda or iced tea. The items listed above may not be a complete list of foods and beverages you should avoid.  Contact a dietitian for more information. Questions to ask a health care provider  Do I need to meet with a diabetes educator?  Do I need to meet with a dietitian?  What number can I call if I have questions?  When are the best times to check my blood glucose? Where to find more information:  American Diabetes Association: diabetes.org  Academy of Nutrition and Dietetics: www.eatright.[employer] of Diabetes and Digestive and Kidney Diseases: DesMoinesFuneral.dk  Association of Diabetes Care and Education Specialists: www.diabeteseducator.org Summary  It is important to have healthy eating habits because your blood sugar (glucose) levels are greatly affected by what you eat and drink.  A healthy meal plan will help you control your blood glucose and maintain a healthy lifestyle.  Your health care provider  may recommend that you work with a dietitian to make a meal plan that is best for you.  Keep in mind that carbohydrates (carbs) and alcohol have immediate effects on your blood glucose levels. It is important to count carbs and to use alcohol carefully. This information is not intended to replace advice given to you by your health care provider. Make sure you discuss any questions you have with your health care provider. Document Revised: 03/10/2019 Document Reviewed: 03/10/2019 Elsevier Patient Education  2021 [employer].

## 2020-06-08 NOTE — Assessment & Plan Note (Signed)
Improved today with A1C of 7.9. Encouraged her to continue to work on diet, increase exercise.  Continue metformin XR 1000 mg BID. Continue glipizide 10 mg BID. Continue Tradjenta 5 mg.   Foot exam today. Will fax for eye report. Managed on statin.  Follow up in 6 months.

## 2020-06-08 NOTE — Progress Notes (Signed)
Subjective:    Patient ID: Carly Buckley, female    DOB: 01-15-1946, 75 y.o.   MRN: [id number]  HPI  This visit occurred during the SARS-CoV-2 public health emergency.  Safety protocols were in place, including screening questions prior to the visit, additional usage of staff PPE, and extensive cleaning of exam room while observing appropriate contact time as indicated for disinfecting solutions.   Carly Buckley is a 75 year old female with a history of hypertension, atrial fibrillation, type 2 diabetes, reactive airway disease who presents today for diabetes follow up.  Current medications include: Tradjenta 5 mg, Metformin XR 1000 mg BID, Glipizide 10 mg BID.   She is checking her blood glucose 0 times daily.   Last A1C: 8.3 I November 2021, 7.9 today Last Eye Exam: UTD per patient Last Foot Exam: Due Pneumonia Vaccination: UTD ACE/ARB: None Urine micro completed in 2021 Statin: Lipitor   BP Readings from Last 3 Encounters:  06/08/20 126/64  02/23/20 126/62  09/22/19 126/70   She endorses a fair diet, is reducing her sweets and starchy/fried foods. She walking some.   Review of Systems  Respiratory: Negative for shortness of breath.   Cardiovascular: Negative for chest pain.  Neurological: Negative for dizziness, numbness and headaches.       Past Medical History:  Diagnosis Date  . Angioedema   . Arthritis   . Colon cancer ([place])    colon ca dx 07, colon resection  . Complication of anesthesia   . Diabetes mellitus   . Diverticulitis   . Dysrhythmia    palpitations  . Helicobacter pylori (H. pylori)   . Hypertension   . Malignant neoplasm of colon   . Neuropathy, idiopathic   . PONV (postoperative nausea and vomiting)   . Type II diabetes mellitus without mention of complication      Social History   Socioeconomic History  . Marital status: Married    Spouse name: Not on file  . Number of children: Not on file  . Years of education: Not on file  . Highest  education level: Not on file  Occupational History  . Not on file  Tobacco Use  . Smoking status: Former Smoker    Quit date: 06/18/1972    Years since quitting: 48.0  . Smokeless tobacco: Never Used  Vaping Use  . Vaping Use: Never used  Substance and Sexual Activity  . Alcohol use: No  . Drug use: No  . Sexual activity: Not Currently  Other Topics Concern  . Not on file  Social History Narrative   Married.   4 children, 7 grandchildren, 3 great grandchildren.   Retired. Once worked at [employer]	[place].   Enjoys walking, puzzle books, spending time with family.   Social Determinants of Health   Financial Resource Strain: Low Risk   . Difficulty of Paying Living Expenses: Not hard at all  Food Insecurity: No Food Insecurity  . Worried About Charity fundraiser in the Last Year: Never true  . Ran Out of Food in the Last Year: Never true  Transportation Needs: No Transportation Needs  . Lack of Transportation (Medical): No  . Lack of Transportation (Non-Medical): No  Physical Activity: Sufficiently Active  . Days of Exercise per Week: 7 days  . Minutes of Exercise per Session: 30 min  Stress: No Stress Concern Present  . Feeling of Stress : Not at all  Social Connections: Not on file  Intimate Partner Violence: Not  At Risk  . Fear of Current or Ex-Partner: No  . Emotionally Abused: No  . Physically Abused: No  . Sexually Abused: No    Past Surgical History:  Procedure Laterality Date  . COLON RESECTION  2007   colon cancer  . STERIOD INJECTION Left 08/04/2012   Procedure: STEROID INJECTION;  Surgeon: Gearlean Alf, MD;  Location: [facility];  Service: Orthopedics;  Laterality: Left;  . TONSILLECTOMY    . TOTAL KNEE ARTHROPLASTY Right 08/04/2012   Procedure: TOTAL KNEE ARTHROPLASTY;  Surgeon: Gearlean Alf, MD;  Location: [facility];  Service: Orthopedics;  Laterality: Right;  . TUBAL LIGATION      Family History  Problem Relation Age of Onset  . Lung cancer Mother  54  . Kidney failure Father 68  . Diabetes Sister   . Diabetes Brother   . Brain cancer Daughter     Allergies  Allergen Reactions  . Lisinopril Swelling  . Morphine And Related     Nauseated   . Oxycodone Nausea Only    Current Outpatient Medications on File Prior to Visit  Medication Sig Dispense Refill  . amLODipine (NORVASC) 10 MG tablet TAKE 1 TABLET BY MOUTH DAILY 90 tablet 0  . apixaban (ELIQUIS) 5 MG TABS tablet Take 1 tablet (5 mg total) by mouth 2 (two) times daily. 60 tablet 5  . atorvastatin (LIPITOR) 20 MG tablet TAKE 1 TABLET BY MOUTH DAILY FOR CHOLESTEROL 90 tablet 1  . Calcium Carb-Cholecalciferol (CALCIUM+D3 PO) Take 1 tablet by mouth daily.    Marland Kitchen glipiZIDE (GLUCOTROL) 10 MG tablet Take 1 tablet (10 mg total) by mouth 2 (two) times daily. For diabetes. 180 tablet 3  . hydrochlorothiazide (HYDRODIURIL) 25 MG tablet TAKE 1 TABLET BY MOUTH DAILY FOR HIGH BLOOD PRESSURE 90 tablet 1  . metFORMIN (GLUCOPHAGE-XR) 500 MG 24 hr tablet TAKE 2 TABLETS BY MOUTH TWICE DAILY 360 tablet 0  . metoprolol succinate (TOPROL-XL) 100 MG 24 hr tablet TAKE 1 TABLET BY MOUTH DAILY WITH A MEAL 90 tablet 3  . Multiple Vitamin (MULTIVITAMIN WITH MINERALS) TABS tablet Take 1 tablet by mouth daily.    . Multiple Vitamins-Minerals (EYE VITAMINS & MINERALS PO) Take 2 capsules by mouth daily.    . ONE TOUCH ULTRA TEST test strip USE AS DIRECTED TO TEST BLOOD SUGAR 100 each 2  . Propylene Glycol 0.6 % SOLN Place 1 drop into both eyes daily as needed (for dry eyes).    . TRADJENTA 5 MG TABS tablet TAKE 1 TABLET BY MOUTH DAILY FOR DIABETES 90 tablet 3   No current facility-administered medications on file prior to visit.    BP 126/64   Pulse 84   Temp 97.8 F (36.6 C) (Temporal)   Wt 194 lb 9.6 oz (88.3 kg)   SpO2 95%   BMI 31.89 kg/m    Objective:   Physical Exam Constitutional:      Appearance: She is well-nourished.  Cardiovascular:     Rate and Rhythm: Normal rate and regular  rhythm.  Pulmonary:     Effort: Pulmonary effort is normal.     Breath sounds: Normal breath sounds.  Musculoskeletal:     Cervical back: Neck supple.  Skin:    General: Skin is warm and dry.  Psychiatric:        Mood and Affect: Mood and affect normal.            Assessment & Plan:

## 2020-06-10 ENCOUNTER — Other Ambulatory Visit: Payer: Self-pay | Admitting: Primary Care

## 2020-06-10 DIAGNOSIS — E1140 Type 2 diabetes mellitus with diabetic neuropathy, unspecified: Secondary | ICD-10-CM

## 2020-06-13 ENCOUNTER — Other Ambulatory Visit: Payer: Self-pay | Admitting: Primary Care

## 2020-06-13 DIAGNOSIS — E785 Hyperlipidemia, unspecified: Secondary | ICD-10-CM

## 2020-06-20 ENCOUNTER — Encounter: Payer: Self-pay | Admitting: Primary Care

## 2020-08-01 ENCOUNTER — Other Ambulatory Visit: Payer: Self-pay | Admitting: Primary Care

## 2020-08-01 ENCOUNTER — Other Ambulatory Visit: Payer: Self-pay | Admitting: Cardiovascular Disease

## 2020-08-01 DIAGNOSIS — I10 Essential (primary) hypertension: Secondary | ICD-10-CM

## 2020-08-01 NOTE — Telephone Encounter (Signed)
Please apologize for me, last time she was in I told her to come back in 6 months, but I actually need to see her for CPE in May. Will you please schedule?

## 2020-08-05 NOTE — Telephone Encounter (Signed)
Attempted to reach patient to schedule. She is scheduled for awv phone call on 6/3, so schedule for part 2 of annual visit after this. Lvm asking pt to call office.

## 2020-08-13 NOTE — Telephone Encounter (Signed)
Left message to return call to our office.  

## 2020-08-22 DIAGNOSIS — Z1231 Encounter for screening mammogram for malignant neoplasm of breast: Secondary | ICD-10-CM | POA: Diagnosis not present

## 2020-08-22 LAB — HM MAMMOGRAPHY

## 2020-08-29 ENCOUNTER — Encounter: Payer: Self-pay | Admitting: Primary Care

## 2020-09-08 DIAGNOSIS — J069 Acute upper respiratory infection, unspecified: Secondary | ICD-10-CM | POA: Diagnosis not present

## 2020-09-08 DIAGNOSIS — R0981 Nasal congestion: Secondary | ICD-10-CM | POA: Diagnosis not present

## 2020-09-08 DIAGNOSIS — R051 Acute cough: Secondary | ICD-10-CM | POA: Diagnosis not present

## 2020-09-16 ENCOUNTER — Other Ambulatory Visit: Payer: Self-pay

## 2020-09-16 ENCOUNTER — Telehealth: Payer: Self-pay

## 2020-09-16 ENCOUNTER — Ambulatory Visit: Payer: Medicare Other

## 2020-09-16 NOTE — Telephone Encounter (Signed)
Called patient 3 times trying to complete AWV. Patient never answered. Left message notifying patient I called and to call back to reschedule at her convenience.

## 2020-09-27 NOTE — Telephone Encounter (Signed)
Patient is scheduled in August.

## 2020-10-07 ENCOUNTER — Other Ambulatory Visit: Payer: Self-pay | Admitting: Cardiovascular Disease

## 2020-10-07 NOTE — Telephone Encounter (Signed)
Age 75, weight 88kg, SCr 0.72 on 06/12/19, pt is overdue for annual lab work [place] 09/2019, afib Annual appt scheduled in July, will send in refill and recheck labs at next visit in a month, pt is on correct dose based on age and weight

## 2020-10-25 ENCOUNTER — Other Ambulatory Visit: Payer: Self-pay | Admitting: Primary Care

## 2020-10-25 DIAGNOSIS — I10 Essential (primary) hypertension: Secondary | ICD-10-CM

## 2020-10-26 ENCOUNTER — Ambulatory Visit: Payer: Medicare Other | Admitting: Cardiovascular Disease

## 2020-11-02 ENCOUNTER — Ambulatory Visit: Payer: Medicare Other | Admitting: Cardiovascular Disease

## 2020-11-08 ENCOUNTER — Other Ambulatory Visit: Payer: Self-pay | Admitting: Cardiovascular Disease

## 2020-11-09 NOTE — Telephone Encounter (Signed)
Prescription refill request for Eliquis received. Indication: Afib Last office visit: 09/21/20 (Dr Acie Fredrickson) - Pending appt in October Scr: 0.72 (06/12/19)  Age: 75 Weight: 88.3kg  Pt needs updated labs. Called pt, no answer and did not leave voicemail. Noted pt has a upcoming appt in October (July appt for this month was canceled.) Will send refill to last until October. Also, there is a note on appt stating pt needs CBC and BMET.

## 2020-11-17 ENCOUNTER — Other Ambulatory Visit: Payer: Self-pay | Admitting: Cardiovascular Disease

## 2020-11-17 ENCOUNTER — Other Ambulatory Visit: Payer: Self-pay | Admitting: Primary Care

## 2020-11-17 DIAGNOSIS — E1140 Type 2 diabetes mellitus with diabetic neuropathy, unspecified: Secondary | ICD-10-CM

## 2020-12-06 ENCOUNTER — Ambulatory Visit: Payer: Medicare Other | Admitting: Primary Care

## 2020-12-20 DIAGNOSIS — Z96651 Presence of right artificial knee joint: Secondary | ICD-10-CM | POA: Diagnosis not present

## 2021-01-05 ENCOUNTER — Ambulatory Visit: Payer: Medicare Other | Admitting: Primary Care

## 2021-01-17 ENCOUNTER — Ambulatory Visit: Payer: Medicare Other | Admitting: Cardiovascular Disease

## 2021-01-17 ENCOUNTER — Encounter: Payer: Self-pay | Admitting: Cardiovascular Disease

## 2021-01-17 ENCOUNTER — Other Ambulatory Visit: Payer: Self-pay

## 2021-01-17 VITALS — BP 150/72 | HR 89 | Ht 65.5 in | Wt 196.2 lb

## 2021-01-17 DIAGNOSIS — I480 Paroxysmal atrial fibrillation: Secondary | ICD-10-CM | POA: Diagnosis not present

## 2021-01-17 DIAGNOSIS — Z7901 Long term (current) use of anticoagulants: Secondary | ICD-10-CM | POA: Diagnosis not present

## 2021-01-17 DIAGNOSIS — I10 Essential (primary) hypertension: Secondary | ICD-10-CM

## 2021-01-17 MED ORDER — POTASSIUM CHLORIDE ER 10 MEQ PO TBCR: 10.0000 meq | EXTENDED_RELEASE_TABLET | Freq: Every day | ORAL | 3 refills | Status: DC

## 2021-01-17 MED ORDER — HYDRALAZINE HCL 25 MG PO TABS: 25.0000 mg | ORAL_TABLET | Freq: Three times a day (TID) | ORAL | 3 refills | Status: DC

## 2021-01-17 NOTE — Progress Notes (Signed)
OFFICE NOTE  Chief Complaint:   Occasional edema  Primary Care Physician: Pleas Koch, NP  Problem list 1. Essential hypertension 2. Atrial fibrillation 3. Diabetes Mellitus    HPI:  Carly Buckley  Is a pleasant 75 year old female kindly referred to me by Dr. Birdie Riddle for palpitations. She recently saw her in the office for a routine physical and was found to have atrial fibrillation. She does have a history of palpitations has gone on for number of years     July 22, 2015:  I'm seeing Carly Buckley again today after a several year absence.   She has been seen by Dr. Debara Pickett in the recent past.   I have seen her and her husband in the past.  No CP or dyspnea.   Does not check her BP at home .  Can tell when she goes into atrial fib - lasts a few minute /  Has been walking 2 miles a day  - has had some recent knee issues and she has slacked off a bit   03/06/2016:  No pains or problems   Aug 24, 2016: Doing well. Is not exercising as much as she would like .  Aug. 31, 2018: Carly Buckley is seen today for follow up of her paroxysmal atrial fib . Walks with a cane now.  Has neuropathy - helps with balance  Is maintaining NSR   July 18, 2017  Doing well.  Very few palpitations.   BP and HR are normal  Exercises some   September 22, 2019: Carly Buckley is seen today for follow up of her PAF.   Was last seen 2 years ago  Has occasional plapitations that only last a few seconds.  Never more than a minute  Not associated with dizziness or dyspnea.  Walks almost daily . Monday  - Friday .  1 - 2 miles a day   BP is elevated.   Still eating salty foods.   January 17, 2021:  Carly Buckley is seen back for follow up of her PAF and HTN.   Still eating salty foods .  No Cp or dyspnea  Had tongue swelling with Lisinopril Will avoid ACE-I and ARBs.    PMHx:  Past Medical History:  Diagnosis Date   Angioedema    Arthritis    Colon cancer ([place])    colon ca dx 07, colon resection   Complication  of anesthesia    Diabetes mellitus    Diverticulitis    Dysrhythmia    palpitations   Helicobacter pylori (H. pylori)    Hypertension    Malignant neoplasm of colon    Neuropathy, idiopathic    PONV (postoperative nausea and vomiting)    Type II diabetes mellitus without mention of complication     Past Surgical History:  Procedure Laterality Date   COLON RESECTION  2007   colon cancer   STERIOD INJECTION Left 08/04/2012   Procedure: STEROID INJECTION;  Surgeon: Gearlean Alf, MD;  Location: [facility];  Service: Orthopedics;  Laterality: Left;   TONSILLECTOMY     TOTAL KNEE ARTHROPLASTY Right 08/04/2012   Procedure: TOTAL KNEE ARTHROPLASTY;  Surgeon: Gearlean Alf, MD;  Location: [facility];  Service: Orthopedics;  Laterality: Right;   TUBAL LIGATION      FAMHx:  Family History  Problem Relation Age of Onset   Lung cancer Mother 53   Kidney failure Father 86   Diabetes Sister    Diabetes Brother    Brain  cancer Daughter     SOCHx:   reports that she quit smoking about 48 years ago. Her smoking use included cigarettes. She has never used smokeless tobacco. She reports that she does not drink alcohol and does not use drugs.  ALLERGIES:  Allergies  Allergen Reactions   Lisinopril Swelling   Morphine And Related     Nauseated    Oxycodone Nausea Only    ROS: Noted in current hx. Otherwise negative   HOME MEDS: Current Outpatient Medications  Medication Sig Dispense Refill   albuterol (VENTOLIN HFA) 108 (90 Base) MCG/ACT inhaler Inhale into the lungs as directed.     amLODipine (NORVASC) 10 MG tablet Take 1 tablet (10 mg total) by mouth daily. For blood pressure. 90 tablet 0   apixaban (ELIQUIS) 5 MG TABS tablet TAKE 1 TABLET BY MOUTH TWICE DAILY 60 tablet 2   atorvastatin (LIPITOR) 20 MG tablet TAKE 1 TABLET BY MOUTH DAILY FOR CHOLESTEROL 90 tablet 3   blood glucose meter kit and supplies KIT Dispense based on patient and insurance preference. Use up to four times  daily as directed. (FOR ICD-9 250.00, 250.01). 1 each 0   Calcium Carb-Cholecalciferol (CALCIUM+D3 PO) Take 1 tablet by mouth daily.     glipiZIDE (GLUCOTROL) 10 MG tablet Take 1 tablet (10 mg total) by mouth 2 (two) times daily. For diabetes. 180 tablet 3   hydrochlorothiazide (HYDRODIURIL) 25 MG tablet TAKE 1 TABLET BY MOUTH DAILY FOR HIGH BLOOD PRESSURE 90 tablet 0   metFORMIN (GLUCOPHAGE-XR) 500 MG 24 hr tablet TAKE 2 TABLETS BY MOUTH TWICE DAILY for diabetes. 360 tablet 0   metoprolol succinate (TOPROL-XL) 100 MG 24 hr tablet TAKE 1 TABLET BY MOUTH DAILY WITH A MEAL 90 tablet 0   Multiple Vitamin (MULTIVITAMIN WITH MINERALS) TABS tablet Take 1 tablet by mouth daily.     Multiple Vitamins-Minerals (EYE VITAMINS & MINERALS PO) Take 2 capsules by mouth daily.     ONE TOUCH ULTRA TEST test strip USE AS DIRECTED TO TEST BLOOD SUGAR 100 each 2   Propylene Glycol 0.6 % SOLN Place 1 drop into both eyes daily as needed (for dry eyes).     TRADJENTA 5 MG TABS tablet TAKE 1 TABLET BY MOUTH DAILY FOR DIABETES 90 tablet 3   No current facility-administered medications for this visit.    LABS/IMAGING: No results found for this or any previous visit (from the past 48 hour(s)). No results found.  VITALS: BP (!) 150/72   Pulse 89   Ht 5' 5.5" (1.664 m)   Wt 196 lb 3.2 oz (89 kg)   SpO2 93%   BMI 32.15 kg/m    Physical Exam: Blood pressure (!) 150/72, pulse 89, height 5' 5.5" (1.664 m), weight 196 lb 3.2 oz (89 kg), SpO2 93 %.  GEN:  elderly female, flat affect.   NAD  HEENT: Normal NECK: No JVD; No carotid bruits LYMPHATICS: No lymphadenopathy CARDIAC: RRR , no murmurs, rubs, gallops RESPIRATORY:  Clear to auscultation without rales, wheezing or rhonchi  ABDOMEN: Soft, non-tender, non-distended MUSCULOSKELETAL:  No edema; No deformity  SKIN: Warm and dry NEUROLOGIC:  Alert and oriented x 3    EKG:    January 17, 2021: Normal sinus rhythm at 89.  Occasional premature ventricular  contraction.  Nonspecific ST and T wave abnormalities.  Assessment/plan 1. Paroxysmal atrial fibrillation.     Her CHADS2VASC score is 4 -   Continue Eliquis.  We will check basic metabolic profile and CBC  today for follow-up of her Eliquis. She continues to have normal sinus rhythm.    2. Hypertension: She continues to eat salt on a regular basis.  She does not exercise.  She is intolerant to ACE inhibitor's and ARB's.  She had an anaphylactic reaction to lisinopril. Will add hydralazine 25 mg 3 times a day.  Her potassium levels have been slightly low in the past.  We will add potassium chloride 10 mill equivalents to her current medications.  We will check a basic metabolic profile in 2 to 3 weeks.  3. Type 2 diabetes mellitus with hypertension:      Mertie Moores, MD  01/17/2021 2:58 PM    [address],  [place], [place]  [address] Pager [phone] Phone: [phone]; Fax: [phone]

## 2021-01-17 NOTE — Patient Instructions (Signed)
Medication Instructions:  Your physician has recommended you make the following change in your medication:  START Hydralazine (Apresoline) 25 mg 3 times daily START Kdur (Potassium chloride) 10 mEq once daily   *If you need a refill on your cardiac medications before your next appointment, please call your pharmacy*   Lab Work: TODAY - CBC, BMET  Your physician recommends that you return for lab work in: 2-3 weeks for [employer]. You do not have to fast for this appointment.   If you have labs (blood work) drawn today and your tests are completely normal, you will receive your results only by: [facility] (if you have MyChart) OR A paper copy in the mail If you have any lab test that is abnormal or we need to change your treatment, we will call you to review the results.   Testing/Procedures: None Ordered   Follow-Up: At [facility], you and your health needs are our priority.  As part of our continuing mission to provide you with exceptional heart care, we have created designated Provider Care Teams.  These Care Teams include your primary Cardiologist (physician) and Advanced Practice Providers (APPs -  Physician Assistants and Nurse Practitioners) who all work together to provide you with the care you need, when you need it.  We recommend signing up for the patient portal called "MyChart".  Sign up information is provided on this After Visit Summary.  MyChart is used to connect with patients for Virtual Visits (Telemedicine).  Patients are able to view lab/test results, encounter notes, upcoming appointments, etc.  Non-urgent messages can be sent to your provider as well.   To learn more about what you can do with MyChart, go to [email or web address].    Your next appointment:   6 month(s)  The format for your next appointment:   In Person  Provider:   You will see one of the following Advanced Practice Providers on your designated Care Team:   Richardson Dopp, PA-C Robbie Lis, PA-C  Then, Mertie Moores, MD will plan to see you again in 1 year(s).

## 2021-01-18 LAB — BASIC METABOLIC PANEL
BUN/Creatinine Ratio: 21 (ref 12–28)
BUN: 17 mg/dL (ref 8–27)
CO2: 19 mmol/L — ABNORMAL LOW (ref 20–29)
Calcium: 11 mg/dL — ABNORMAL HIGH (ref 8.7–10.3)
Chloride: 99 mmol/L (ref 96–106)
Creatinine, Ser: 0.8 mg/dL (ref 0.57–1.00)
Glucose: 185 mg/dL — ABNORMAL HIGH (ref 70–99)
Potassium: 3.7 mmol/L (ref 3.5–5.2)
Sodium: 142 mmol/L (ref 134–144)
eGFR: 77 mL/min/{1.73_m2} (ref >59)

## 2021-01-18 LAB — CBC
Hematocrit: 38.8 % (ref 34.0–46.6)
Hemoglobin: 12.9 g/dL (ref 11.1–15.9)
MCH: 29.8 pg (ref 26.6–33.0)
MCHC: 33.2 g/dL (ref 31.5–35.7)
MCV: 90 fL (ref 79–97)
Platelets: 204 10*3/uL (ref 150–450)
RBC: 4.33 x10E6/uL (ref 3.77–5.28)
RDW: 14.7 % (ref 11.7–15.4)
WBC: 6.7 10*3/uL (ref 3.4–10.8)

## 2021-01-24 ENCOUNTER — Other Ambulatory Visit: Payer: Self-pay | Admitting: Primary Care

## 2021-01-24 DIAGNOSIS — I10 Essential (primary) hypertension: Secondary | ICD-10-CM

## 2021-01-30 ENCOUNTER — Telehealth: Payer: Self-pay | Admitting: Cardiovascular Disease

## 2021-01-30 NOTE — Telephone Encounter (Signed)
Spoke to the patients husband (DPR). Discussed new medications started at last OV. Hydralazine and Potassium. Stating that she was not taking either medication and that she is on too many pills already. Educated why the medication was started and the risks of not taking them. Patient does not take BP readings at home. Husband notified that they were not coming for the lab appointment either because they have an appointment with the PCP in January for blood work and she gives her the BP medication she needs. Education was provided.  Verbalized understanding.   Will forward to Dr. Acie Fredrickson, remove medication from med list and lab appointment already canceled by scheduling staff.Marland Kitchen

## 2021-01-30 NOTE — Telephone Encounter (Signed)
New Message:       Patient's husband called. He wanted [facility] to know that the patient said she is not going to take the Hydralazine. She said she is already taking 2 other blood pressure medicine from her primary doctor.    Pt c/o medication issue:  1. Name of Medication: Hydralazine  2. How are you currently taking this medication (dosage and times per day)? 3 times a day  3. Are you having a reaction (difficulty breathing--STAT)?   4. What is your medication issue?  Patient says she will no longer take this medicine

## 2021-01-31 ENCOUNTER — Other Ambulatory Visit: Payer: Medicare Other

## 2021-02-07 ENCOUNTER — Other Ambulatory Visit: Payer: Self-pay | Admitting: Cardiovascular Disease

## 2021-02-09 ENCOUNTER — Other Ambulatory Visit: Payer: Self-pay | Admitting: Cardiovascular Disease

## 2021-02-09 NOTE — Telephone Encounter (Signed)
Pt last saw Dr Acie Fredrickson 01/17/21, last labs 01/17/21 Creat 0.80, age 75, weight 89kg, based on specified criteria pt is on appropriate dosage of Eliquis 5mg  BID for afib.  Will refill rx.

## 2021-02-25 ENCOUNTER — Other Ambulatory Visit: Payer: Self-pay | Admitting: Primary Care

## 2021-02-25 DIAGNOSIS — E1140 Type 2 diabetes mellitus with diabetic neuropathy, unspecified: Secondary | ICD-10-CM

## 2021-02-26 NOTE — Telephone Encounter (Signed)
This patient was due for diabetes follow up in August 2022 and was not evaluated.  Will you get her set up for a video visit and labs at [facility] for as soon as possible?  Let me know when this is done. Does she have any metformin left?

## 2021-02-27 NOTE — Telephone Encounter (Signed)
Left message to return call to our office.  

## 2021-02-28 NOTE — Telephone Encounter (Signed)
  Encourage patient to contact the pharmacy for refills or they can request refills through [facility]:  Please schedule appointment if longer than 1 year  NEXT APPOINTMENT DATE:  MEDICATION:metFORMIN (GLUCOPHAGE-XR) 500 MG 24 hr tablet   Is the patient out of medication?   [place]    Let patient know to contact pharmacy at the end of the day to make sure medication is ready.  Please notify patient to allow 48-72 hours to process  CLINICAL FILLS OUT ALL BELOW:   LAST REFILL:  QTY:  REFILL DATE:    OTHER COMMENTS:    Okay for refill?  Please advise

## 2021-04-24 ENCOUNTER — Other Ambulatory Visit: Payer: Self-pay | Admitting: Primary Care

## 2021-04-24 DIAGNOSIS — I10 Essential (primary) hypertension: Secondary | ICD-10-CM

## 2021-04-25 ENCOUNTER — Other Ambulatory Visit: Payer: Self-pay

## 2021-04-25 ENCOUNTER — Ambulatory Visit (INDEPENDENT_AMBULATORY_CARE_PROVIDER_SITE_OTHER): Payer: Medicare Other | Admitting: Primary Care

## 2021-04-25 ENCOUNTER — Encounter: Payer: Self-pay | Admitting: Primary Care

## 2021-04-25 VITALS — BP 138/64 | HR 89 | Temp 97.8°F | Ht 65.5 in | Wt 187.0 lb

## 2021-04-25 DIAGNOSIS — I480 Paroxysmal atrial fibrillation: Secondary | ICD-10-CM

## 2021-04-25 DIAGNOSIS — R000 Tachycardia, unspecified: Secondary | ICD-10-CM | POA: Diagnosis not present

## 2021-04-25 DIAGNOSIS — E1140 Type 2 diabetes mellitus with diabetic neuropathy, unspecified: Secondary | ICD-10-CM | POA: Diagnosis not present

## 2021-04-25 DIAGNOSIS — E785 Hyperlipidemia, unspecified: Secondary | ICD-10-CM | POA: Diagnosis not present

## 2021-04-25 DIAGNOSIS — Z1231 Encounter for screening mammogram for malignant neoplasm of breast: Secondary | ICD-10-CM

## 2021-04-25 DIAGNOSIS — Z0000 Encounter for general adult medical examination without abnormal findings: Secondary | ICD-10-CM | POA: Diagnosis not present

## 2021-04-25 DIAGNOSIS — G4700 Insomnia, unspecified: Secondary | ICD-10-CM

## 2021-04-25 DIAGNOSIS — I10 Essential (primary) hypertension: Secondary | ICD-10-CM | POA: Diagnosis not present

## 2021-04-25 DIAGNOSIS — Z23 Encounter for immunization: Secondary | ICD-10-CM

## 2021-04-25 DIAGNOSIS — E2839 Other primary ovarian failure: Secondary | ICD-10-CM

## 2021-04-25 DIAGNOSIS — M1710 Unilateral primary osteoarthritis, unspecified knee: Secondary | ICD-10-CM | POA: Diagnosis not present

## 2021-04-25 DIAGNOSIS — Z85038 Personal history of other malignant neoplasm of large intestine: Secondary | ICD-10-CM

## 2021-04-25 LAB — HEMOGLOBIN A1C: Hgb A1c MFr Bld: 8.7 % — ABNORMAL HIGH (ref 4.6–6.5)

## 2021-04-25 LAB — COMPREHENSIVE METABOLIC PANEL
ALT: 19 U/L (ref 0–35)
AST: 23 U/L (ref 0–37)
Albumin: 4.6 g/dL (ref 3.5–5.2)
Alkaline Phosphatase: 72 U/L (ref 39–117)
BUN: 23 mg/dL (ref 6–23)
CO2: 26 mEq/L (ref 19–32)
Calcium: 10.2 mg/dL (ref 8.4–10.5)
Chloride: 97 mEq/L (ref 96–112)
Creatinine, Ser: 0.75 mg/dL (ref 0.40–1.20)
GFR: 77.9 mL/min (ref >60.00)
Glucose, Bld: 203 mg/dL — ABNORMAL HIGH (ref 70–99)
Potassium: 3.2 mEq/L — ABNORMAL LOW (ref 3.5–5.1)
Sodium: 134 mEq/L — ABNORMAL LOW (ref 135–145)
Total Bilirubin: 0.6 mg/dL (ref 0.2–1.2)
Total Protein: 8.1 g/dL (ref 6.0–8.3)

## 2021-04-25 LAB — LIPID PANEL
Cholesterol: 109 mg/dL (ref 0–200)
HDL: 47.3 mg/dL (ref >39.00)
LDL Cholesterol: 52 mg/dL (ref 0–99)
NonHDL: 61.39
Total CHOL/HDL Ratio: 2
Triglycerides: 46 mg/dL (ref 0.0–149.0)
VLDL: 9.2 mg/dL (ref 0.0–40.0)

## 2021-04-25 LAB — MICROALBUMIN / CREATININE URINE RATIO
Creatinine,U: 15.3 mg/dL
Microalb Creat Ratio: 7.4 mg/g (ref 0.0–30.0)
Microalb, Ur: 1.1 mg/dL (ref 0.0–1.9)

## 2021-04-25 MED ORDER — ZOSTER VAC RECOMB ADJUVANTED 50 MCG/0.5ML IM SUSR: 0.5000 mL | Freq: Once | INTRAMUSCULAR | 1 refills | Status: AC

## 2021-04-25 NOTE — Assessment & Plan Note (Addendum)
Stable in the office today.  Reviewed cardiology notes from October 2022. Not on hydralazine, is compliant to potassium chloride 10 mEq.  CMP pending to include potassium.  Continue HCTZ 25 mg, amlodipine 10 mg, and metoprolol succinate 100 mg. Continue potassium 10 mEQ.

## 2021-04-25 NOTE — Assessment & Plan Note (Addendum)
Rate and rhythm regular today.  Continue Eliquis 5 mg BID and metoprolol succinate 100 mg.   Cardiology notes reviewed from October 2022.

## 2021-04-25 NOTE — Assessment & Plan Note (Signed)
Overall stable. Continue with PRN knee injections. Continue to monitor.

## 2021-04-25 NOTE — Progress Notes (Signed)
Subjective:    Patient ID: Carly Buckley, female    DOB: [birth year], 76 y.o.   MRN: [id number]  HPI  Carly Buckley is a very pleasant 76 y.o. female who presents today for complete physical and follow up of chronic conditions.  Immunizations: -Tetanus: 2008 -Influenza: Completed today -Covid-19:Has not completed -Shingles: Has not completed -Pneumonia: Prevnar 13 2016, pneumovax 2018  Diet: Saline.  Exercise: Walks almost everyday  Eye exam: Completes annually  Dental exam: No recent visit.  Mammogram: Completed in May 2022 Dexa: Completed in 2021 Colonoscopy: Completed in 2020, due 2025   BP Readings from Last 3 Encounters:  04/25/21 138/64  01/17/21 (!) 150/72  06/08/20 126/64      Review of Systems  Constitutional:  Negative for unexpected weight change.  HENT:  Negative for rhinorrhea.   Eyes:  Negative for visual disturbance.  Respiratory:  Negative for shortness of breath.   Cardiovascular:  Negative for chest pain.  Gastrointestinal:  Negative for constipation and diarrhea.  Genitourinary:  Negative for difficulty urinating.  Musculoskeletal:  Negative for arthralgias and myalgias.  Skin:  Negative for rash.  Allergic/Immunologic: Positive for environmental allergies.  Neurological:  Negative for dizziness and headaches.  Psychiatric/Behavioral:  The patient is not nervous/anxious.         Past Medical History:  Diagnosis Date   Angioedema    Arthritis    Colon cancer ([place])    colon ca dx 07, colon resection   Complication of anesthesia    Diabetes mellitus    Diverticulitis    Dysrhythmia    palpitations   Helicobacter pylori (H. pylori)    Hypertension    Malignant neoplasm of colon    Neuropathy, idiopathic    PONV (postoperative nausea and vomiting)    Postoperative anemia due to acute blood loss 08/05/2012   Type II diabetes mellitus without mention of complication     Social History   Socioeconomic History   Marital status:  Married    Spouse name: Not on file   Number of children: Not on file   Years of education: Not on file   Highest education level: Not on file  Occupational History   Not on file  Tobacco Use   Smoking status: Former    Types: Cigarettes    Quit date: 06/18/1972    Years since quitting: 48.8   Smokeless tobacco: Never  Vaping Use   Vaping Use: Never used  Substance and Sexual Activity   Alcohol use: No   Drug use: No   Sexual activity: Not Currently  Other Topics Concern   Not on file  Social History Narrative   Married.   4 children, 7 grandchildren, 3 great grandchildren.   Retired. Once worked at [employer]	[place].   Enjoys walking, puzzle books, spending time with family.   Social Determinants of Health   Financial Resource Strain: Not on file  Food Insecurity: Not on file  Transportation Needs: Not on file  Physical Activity: Not on file  Stress: Not on file  Social Connections: Not on file  Intimate Partner Violence: Not on file    Past Surgical History:  Procedure Laterality Date   COLON RESECTION  2007   colon cancer   STERIOD INJECTION Left 08/04/2012   Procedure: STEROID INJECTION;  Surgeon: Gearlean Alf, MD;  Location: [facility];  Service: Orthopedics;  Laterality: Left;   TONSILLECTOMY     TOTAL KNEE ARTHROPLASTY Right 08/04/2012   Procedure:  TOTAL KNEE ARTHROPLASTY;  Surgeon: Gearlean Alf, MD;  Location: [facility];  Service: Orthopedics;  Laterality: Right;   TUBAL LIGATION      Family History  Problem Relation Age of Onset   Lung cancer Mother 100   Kidney failure Father 73   Diabetes Sister    Diabetes Brother    Brain cancer Daughter     Allergies  Allergen Reactions   Lisinopril Swelling   Morphine And Related     Nauseated    Oxycodone Nausea Only    Current Outpatient Medications on File Prior to Visit  Medication Sig Dispense Refill   amLODipine (NORVASC) 10 MG tablet Take 1 tablet (10 mg total) by mouth daily. For blood pressure.  Office visit required for further refills. 30 tablet 0   apixaban (ELIQUIS) 5 MG TABS tablet TAKE 1 TABLET BY MOUTH TWICE DAILY 60 tablet 5   atorvastatin (LIPITOR) 20 MG tablet TAKE 1 TABLET BY MOUTH DAILY FOR CHOLESTEROL 90 tablet 3   blood glucose meter kit and supplies KIT Dispense based on patient and insurance preference. Use up to four times daily as directed. (FOR ICD-9 250.00, 250.01). 1 each 0   Calcium Carb-Cholecalciferol (CALCIUM+D3 PO) Take 1 tablet by mouth daily.     glipiZIDE (GLUCOTROL) 10 MG tablet Take 1 tablet (10 mg total) by mouth 2 (two) times daily. For diabetes. 180 tablet 3   hydrochlorothiazide (HYDRODIURIL) 25 MG tablet TAKE 1 TABLET BY MOUTH DAILY FOR HIGH BLOOD PRESSURE. Office visit required for further refills. 30 tablet 0   metFORMIN (GLUCOPHAGE-XR) 500 MG 24 hr tablet TAKE 2 TABLETS BY MOUTH TWICE DAILY for diabetes. Office visit required for further refills. 360 tablet 0   metoprolol succinate (TOPROL-XL) 100 MG 24 hr tablet TAKE 1 TABLET BY MOUTH DAILY WITH A MEAL 90 tablet 3   Multiple Vitamin (MULTIVITAMIN WITH MINERALS) TABS tablet Take 1 tablet by mouth daily.     Multiple Vitamins-Minerals (EYE VITAMINS & MINERALS PO) Take 2 capsules by mouth daily.     Propylene Glycol 0.6 % SOLN Place 1 drop into both eyes daily as needed (for dry eyes).     TRADJENTA 5 MG TABS tablet TAKE 1 TABLET BY MOUTH DAILY FOR DIABETES 90 tablet 3   albuterol (VENTOLIN HFA) 108 (90 Base) MCG/ACT inhaler Inhale into the lungs as directed. (Patient not taking: Reported on 04/25/2021)     ONE TOUCH ULTRA TEST test strip USE AS DIRECTED TO TEST BLOOD SUGAR (Patient not taking: Reported on 04/25/2021) 100 each 2   potassium chloride (KLOR-CON) 10 MEQ tablet Take 10 mEq by mouth daily.     No current facility-administered medications on file prior to visit.    BP 138/64    Pulse 89    Temp 97.8 F (36.6 C) (Temporal)    Ht 5' 5.5" (1.664 m)    Wt 187 lb (84.8 kg)    SpO2 98%    BMI  30.65 kg/m  Objective:   Physical Exam HENT:     Right Ear: Tympanic membrane and ear canal normal.     Left Ear: Tympanic membrane and ear canal normal.     Nose: Nose normal.  Eyes:     Conjunctiva/sclera: Conjunctivae normal.     Pupils: Pupils are equal, round, and reactive to light.  Neck:     Thyroid: No thyromegaly.  Cardiovascular:     Rate and Rhythm: Normal rate and regular rhythm.     Heart sounds:  No murmur heard. Pulmonary:     Effort: Pulmonary effort is normal.     Breath sounds: Normal breath sounds. No rales.  Abdominal:     General: Bowel sounds are normal.     Palpations: Abdomen is soft.     Tenderness: There is no abdominal tenderness.  Musculoskeletal:        General: Normal range of motion.     Cervical back: Neck supple.  Lymphadenopathy:     Cervical: No cervical adenopathy.  Skin:    General: Skin is warm and dry.     Findings: No rash.  Neurological:     Mental Status: She is alert and oriented to person, place, and time.     Cranial Nerves: No cranial nerve deficit.     Deep Tendon Reflexes: Reflexes are normal and symmetric.  Psychiatric:        Mood and Affect: Mood normal.          Assessment & Plan:      This visit occurred during the SARS-CoV-2 public health emergency.  Safety protocols were in place, including screening questions prior to the visit, additional usage of staff PPE, and extensive cleaning of exam room while observing appropriate contact time as indicated for disinfecting solutions.

## 2021-04-25 NOTE — Assessment & Plan Note (Addendum)
Colonoscopy UTD, due 2025. Completed at [facility], see under media tab in chart review.

## 2021-04-25 NOTE — Assessment & Plan Note (Signed)
Denies concerns.  Rate and rhythm regular today. Continue metoprolol succinate 100 mg daily.

## 2021-04-25 NOTE — Assessment & Plan Note (Signed)
Rx for Shingrix provided. Influenza vaccine provided today. Other vaccines UTD.  Mammogram and bone density UTD and due May 2023. Orders placed.  Colonoscopy UTD, due 2025 per [facility].   Discussed the importance of a healthy diet and regular exercise in order for weight loss, and to reduce the risk of further co-morbidity.  Exam today stable. Labs pending.

## 2021-04-25 NOTE — Assessment & Plan Note (Signed)
Compliant to atorvastatin 20 mg, continue same. Repeat lipid panel pending.

## 2021-04-25 NOTE — Assessment & Plan Note (Signed)
Denies concerns today. °Continue to monitor. °

## 2021-04-25 NOTE — Patient Instructions (Signed)
Stop by the lab prior to leaving today. I will notify you of your results once received.   Take the shingles vaccine to the pharmacy.  Schedule your mammogram and bone density scan for May 2023.  It was a pleasure to see you today!  Preventive Care 69 Years and Older, Female Preventive care refers to lifestyle choices and visits with your health care provider that can promote health and wellness. Preventive care visits are also called wellness exams. What can I expect for my preventive care visit? Counseling Your health care provider may ask you questions about your: Medical history, including: Past medical problems. Family medical history. Pregnancy and menstrual history. History of falls. Current health, including: Memory and ability to understand (cognition). Emotional well-being. Home life and relationship well-being. Sexual activity and sexual health. Lifestyle, including: Alcohol, nicotine or tobacco, and drug use. Access to firearms. Diet, exercise, and sleep habits. Work and work Statistician. Sunscreen use. Safety issues such as seatbelt and bike helmet use. Physical exam Your health care provider will check your: Height and weight. These may be used to calculate your BMI (body mass index). BMI is a measurement that tells if you are at a healthy weight. Waist circumference. This measures the distance around your waistline. This measurement also tells if you are at a healthy weight and may help predict your risk of certain diseases, such as type 2 diabetes and high blood pressure. Heart rate and blood pressure. Body temperature. Skin for abnormal spots. What immunizations do I need? Vaccines are usually given at various ages, according to a schedule. Your health care provider will recommend vaccines for you based on your age, medical history, and lifestyle or other factors, such as travel or where you work. What tests do I need? Screening Your health care provider may  recommend screening tests for certain conditions. This may include: Lipid and cholesterol levels. Hepatitis C test. Hepatitis B test. HIV (human immunodeficiency virus) test. STI (sexually transmitted infection) testing, if you are at risk. Lung cancer screening. Colorectal cancer screening. Diabetes screening. This is done by checking your blood sugar (glucose) after you have not eaten for a while (fasting). Mammogram. Talk with your health care provider about how often you should have regular mammograms. BRCA-related cancer screening. This may be done if you have a family history of breast, ovarian, tubal, or peritoneal cancers. Bone density scan. This is done to screen for osteoporosis. Talk with your health care provider about your test results, treatment options, and if necessary, the need for more tests. Follow these instructions at home: Eating and drinking  Eat a diet that includes fresh fruits and vegetables, whole grains, lean protein, and low-fat dairy products. Limit your intake of foods with high amounts of sugar, saturated fats, and salt. Take vitamin and mineral supplements as recommended by your health care provider. Do not drink alcohol if your health care provider tells you not to drink. If you drink alcohol: Limit how much you have to 0-1 drink a day. Know how much alcohol is in your drink. In the [place] one drink equals one 12 oz bottle of beer (355 mL), one 5 oz glass of wine (148 mL), or one 1 oz glass of hard liquor (44 mL). Lifestyle Brush your teeth every morning and night with fluoride toothpaste. Floss one time each day. Exercise for at least 30 minutes 5 or more days each week. Do not use any products that contain nicotine or tobacco. These products include cigarettes, chewing tobacco, and  vaping devices, such as e-cigarettes. If you need help quitting, ask your health care provider. Do not use drugs. If you are sexually active, practice safe sex. Use a condom  or other form of protection in order to prevent STIs. Take aspirin only as told by your health care provider. Make sure that you understand how much to take and what form to take. Work with your health care provider to find out whether it is safe and beneficial for you to take aspirin daily. Ask your health care provider if you need to take a cholesterol-lowering medicine (statin). Find healthy ways to manage stress, such as: Meditation, yoga, or listening to music. Journaling. Talking to a trusted person. Spending time with friends and family. Minimize exposure to UV radiation to reduce your risk of skin cancer. Safety Always wear your seat belt while driving or riding in a vehicle. Do not drive: If you have been drinking alcohol. Do not ride with someone who has been drinking. When you are tired or distracted. While texting. If you have been using any mind-altering substances or drugs. Wear a helmet and other protective equipment during sports activities. If you have firearms in your house, make sure you follow all gun safety procedures. What's next? Visit your health care provider once a year for an annual wellness visit. Ask your health care provider how often you should have your eyes and teeth checked. Stay up to date on all vaccines. This information is not intended to replace advice given to you by your health care provider. Make sure you discuss any questions you have with your health care provider. Document Revised: 09/28/2020 Document Reviewed: 09/28/2020 Elsevier Patient Education  [place].

## 2021-04-25 NOTE — Assessment & Plan Note (Signed)
Compliant to regimen of Tradjent 5 mg daily, Glipizide 10 mg BID, and metformin XR 1000 mg BID. Continue same.  Repeat A1C pending.  Managed on statin.  Urine microalbumin due and pending. Pneumonia vaccine UTD. Eye exam UTD.  Follow up in 6 months.

## 2021-05-04 ENCOUNTER — Telehealth: Payer: Self-pay | Admitting: Primary Care

## 2021-05-04 NOTE — Telephone Encounter (Signed)
Called patient documented information in lab results.

## 2021-05-04 NOTE — Telephone Encounter (Signed)
Pt returning call . Regarding getting her lab result would like a call back [phone]

## 2021-05-05 ENCOUNTER — Other Ambulatory Visit: Payer: Self-pay | Admitting: Primary Care

## 2021-05-05 DIAGNOSIS — E876 Hypokalemia: Secondary | ICD-10-CM

## 2021-05-12 ENCOUNTER — Other Ambulatory Visit: Payer: Self-pay

## 2021-05-12 ENCOUNTER — Other Ambulatory Visit (INDEPENDENT_AMBULATORY_CARE_PROVIDER_SITE_OTHER): Payer: Medicare Other

## 2021-05-12 DIAGNOSIS — E876 Hypokalemia: Secondary | ICD-10-CM | POA: Diagnosis not present

## 2021-05-12 LAB — BASIC METABOLIC PANEL
BUN: 26 mg/dL — ABNORMAL HIGH (ref 6–23)
CO2: 28 mEq/L (ref 19–32)
Calcium: 10.2 mg/dL (ref 8.4–10.5)
Chloride: 100 mEq/L (ref 96–112)
Creatinine, Ser: 0.92 mg/dL (ref 0.40–1.20)
GFR: 60.94 mL/min (ref >60.00)
Glucose, Bld: 202 mg/dL — ABNORMAL HIGH (ref 70–99)
Potassium: 3.8 mEq/L (ref 3.5–5.1)
Sodium: 139 mEq/L (ref 135–145)

## 2021-05-14 DIAGNOSIS — J0100 Acute maxillary sinusitis, unspecified: Secondary | ICD-10-CM | POA: Diagnosis not present

## 2021-05-14 DIAGNOSIS — J209 Acute bronchitis, unspecified: Secondary | ICD-10-CM | POA: Diagnosis not present

## 2021-05-23 ENCOUNTER — Other Ambulatory Visit: Payer: Self-pay | Admitting: Primary Care

## 2021-05-23 DIAGNOSIS — I10 Essential (primary) hypertension: Secondary | ICD-10-CM

## 2021-06-06 ENCOUNTER — Other Ambulatory Visit: Payer: Self-pay | Admitting: Primary Care

## 2021-06-06 DIAGNOSIS — E876 Hypokalemia: Secondary | ICD-10-CM

## 2021-06-12 ENCOUNTER — Other Ambulatory Visit (INDEPENDENT_AMBULATORY_CARE_PROVIDER_SITE_OTHER): Payer: Medicare Other

## 2021-06-12 ENCOUNTER — Other Ambulatory Visit: Payer: Self-pay | Admitting: Primary Care

## 2021-06-12 ENCOUNTER — Other Ambulatory Visit: Payer: Self-pay

## 2021-06-12 DIAGNOSIS — E876 Hypokalemia: Secondary | ICD-10-CM

## 2021-06-12 DIAGNOSIS — E1140 Type 2 diabetes mellitus with diabetic neuropathy, unspecified: Secondary | ICD-10-CM

## 2021-06-12 LAB — POTASSIUM: Potassium: 4.1 mEq/L (ref 3.5–5.1)

## 2021-06-12 NOTE — Telephone Encounter (Signed)
LAst OV and Labs  04-25-21 Next OV 08-02-21

## 2021-06-27 NOTE — Progress Notes (Signed)
? ?Subjective:  ? Carly Buckley is a 76 y.o. female who presents for Medicare Annual (Subsequent) preventive examination. ? ?I connected with Carly Buckley today by telephone and verified that I am speaking with the correct person using two identifiers. ?Location patient: home ?Location provider: work ?Persons participating in the virtual visit: patient, nurse.  ?  ?I discussed the limitations, risks, security and privacy concerns of performing an evaluation and management service by telephone and the availability of in person appointments. I also discussed with the patient that there may be a patient responsible charge related to this service. The patient expressed understanding and verbally consented to this telephonic visit.  ?  ?Interactive audio and video telecommunications were attempted between this provider and patient, however failed, due to patient having technical difficulties OR patient did not have access to video capability.  We continued and completed visit with audio only. ? ?Some vital signs may be absent or patient reported.  ? ?Time Spent with patient on telephone encounter: 20 minutes ? ?Review of Systems    ? ?Cardiac Risk Factors include: advanced age (>66mn, >>72women);diabetes mellitus;hypertension;dyslipidemia ? ?   ?Objective:  ?  ?Today's Vitals  ? 06/28/21 1452  ?Weight: 187 lb (84.8 kg)  ?Height: 5' 5"  (1.651 m)  ? ?Body mass index is 31.12 kg/m?. ? ?Advanced Directives 06/28/2021 09/16/2019 06/05/2018 05/31/2017 08/04/2012 08/04/2012 07/28/2012  ?Does Patient Have a Medical Advance Directive? Yes Yes Yes Yes Patient has advance directive, copy not in chart Patient has advance directive, copy not in chart Patient has advance directive, copy not in chart  ?Type of Advance Directive Living will;Healthcare Power of ASevernLiving will HWalkerLiving will HWadsworthLiving will Living will Living will (No Data)  ?Does patient want  to make changes to medical advance directive? Yes (MAU/Ambulatory/Procedural Areas - Information given) - - - - - -  ?Copy of [place]in Chart? - No - copy requested No - copy requested No - copy requested Copy requested from family Copy requested from family Copy requested from family  ?Pre-existing out of facility DNR order (yellow form or pink MOST form) - - - - No No -  ? ? ?Current Medications (verified) ?Outpatient Encounter Medications as of 06/28/2021  ?Medication Sig  ? amLODipine (NORVASC) 10 MG tablet Take 1 tablet (10 mg total) by mouth daily. For blood pressure.  ? apixaban (ELIQUIS) 5 MG TABS tablet TAKE 1 TABLET BY MOUTH TWICE DAILY  ? atorvastatin (LIPITOR) 20 MG tablet TAKE 1 TABLET BY MOUTH DAILY FOR CHOLESTEROL  ? blood glucose meter kit and supplies KIT Dispense based on patient and insurance preference. Use up to four times daily as directed. (FOR ICD-9 250.00, 250.01).  ? Calcium Carb-Cholecalciferol (CALCIUM+D3 PO) Take 1 tablet by mouth daily.  ? glipiZIDE (GLUCOTROL) 10 MG tablet Take 1 tablet (10 mg total) by mouth 2 (two) times daily. For diabetes.  ? hydrochlorothiazide (HYDRODIURIL) 25 MG tablet TAKE 1 TABLET BY MOUTH DAILY FOR HIGH BLOOD PRESSURE  ? linagliptin (TRADJENTA) 5 MG TABS tablet TAKE 1 TABLET BY MOUTH DAILY FOR DIABETES  ? metFORMIN (GLUCOPHAGE-XR) 500 MG 24 hr tablet Take 2 tablets (1,000 mg total) by mouth 2 (two) times daily with a meal. For diabetes.  ? metoprolol succinate (TOPROL-XL) 100 MG 24 hr tablet TAKE 1 TABLET BY MOUTH DAILY WITH A MEAL  ? Multiple Vitamin (MULTIVITAMIN WITH MINERALS) TABS tablet Take 1 tablet by mouth daily.  ?  Multiple Vitamins-Minerals (EYE VITAMINS & MINERALS PO) Take 2 capsules by mouth daily.  ? potassium chloride (KLOR-CON) 10 MEQ tablet Take 10 mEq by mouth daily.  ? Propylene Glycol 0.6 % SOLN Place 1 drop into both eyes daily as needed (for dry eyes).  ? albuterol (VENTOLIN HFA) 108 (90 Base) MCG/ACT inhaler Inhale  into the lungs as directed. (Patient not taking: Reported on 04/25/2021)  ? ONE TOUCH ULTRA TEST test strip USE AS DIRECTED TO TEST BLOOD SUGAR (Patient not taking: Reported on 04/25/2021)  ? ?No facility-administered encounter medications on file as of 06/28/2021.  ? ? ?Allergies (verified) ?Lisinopril, Morphine and related, and Oxycodone  ? ?History: ?Past Medical History:  ?Diagnosis Date  ? Angioedema   ? Arthritis   ? Colon cancer ([place])   ? colon ca dx 07, colon resection  ? Complication of anesthesia   ? Diabetes mellitus   ? Diverticulitis   ? Dysrhythmia   ? palpitations  ? Helicobacter pylori (H. pylori)   ? Hypertension   ? Malignant neoplasm of colon   ? Neuropathy, idiopathic   ? PONV (postoperative nausea and vomiting)   ? Postoperative anemia due to acute blood loss 08/05/2012  ? Type II diabetes mellitus without mention of complication   ? ?Past Surgical History:  ?Procedure Laterality Date  ? COLON RESECTION  2007  ? colon cancer  ? STERIOD INJECTION Left 08/04/2012  ? Procedure: STEROID INJECTION;  Surgeon: Gearlean Alf, MD;  Location: [facility];  Service: Orthopedics;  Laterality: Left;  ? TONSILLECTOMY    ? TOTAL KNEE ARTHROPLASTY Right 08/04/2012  ? Procedure: TOTAL KNEE ARTHROPLASTY;  Surgeon: Gearlean Alf, MD;  Location: [facility];  Service: Orthopedics;  Laterality: Right;  ? TUBAL LIGATION    ? ?Family History  ?Problem Relation Age of Onset  ? Lung cancer Mother 58  ? Kidney failure Father 66  ? Diabetes Sister   ? Diabetes Brother   ? Brain cancer Daughter   ? ?Social History  ? ?Socioeconomic History  ? Marital status: Married  ?  Spouse name: Not on file  ? Number of children: Not on file  ? Years of education: Not on file  ? Highest education level: Not on file  ?Occupational History  ? Not on file  ?Tobacco Use  ? Smoking status: Former  ?  Types: Cigarettes  ?  Quit date: 06/18/1972  ?  Years since quitting: 49.0  ? Smokeless tobacco: Never  ?Vaping Use  ? Vaping Use: Never used  ?Substance  and Sexual Activity  ? Alcohol use: No  ? Drug use: No  ? Sexual activity: Not Currently  ?Other Topics Concern  ? Not on file  ?Social History Narrative  ? Married.  ? 4 children, 7 grandchildren, 3 great grandchildren.  ? Retired. Once worked at [employer]	[place].  ? Enjoys walking, puzzle books, spending time with family.  ? ?Social Determinants of Health  ? ?Financial Resource Strain: Low Risk   ? Difficulty of Paying Living Expenses: Not hard at all  ?Food Insecurity: No Food Insecurity  ? Worried About Charity fundraiser in the Last Year: Never true  ? Ran Out of Food in the Last Year: Never true  ?Transportation Needs: No Transportation Needs  ? Lack of Transportation (Medical): No  ? Lack of Transportation (Non-Medical): No  ?Physical Activity: Sufficiently Active  ? Days of Exercise per Week: 5 days  ? Minutes of Exercise per Session: 40 min  ?  Stress: No Stress Concern Present  ? Feeling of Stress : Not at all  ?Social Connections: Moderately Integrated  ? Frequency of Communication with Friends and Family: More than three times a week  ? Frequency of Social Gatherings with Friends and Family: More than three times a week  ? Attends Religious Services: More than 4 times per year  ? Active Member of Clubs or Organizations: No  ? Attends Archivist Meetings: Never  ? Marital Status: Married  ? ? ?Tobacco Counseling ?Counseling given: Not Answered ? ? ?Clinical Intake: ? ?Pre-visit preparation completed: Yes ? ?Pain : No/denies pain ? ?  ? ?BMI - recorded: 31.12 ?Nutritional Status: BMI > 30  Obese ?Nutritional Risks: None ?Diabetes: Yes ?CBG done?: No ?Did pt. bring in CBG monitor from home?: No ? ?How often do you need to have someone help you when you read instructions, pamphlets, or other written materials from your doctor or pharmacy?: 1 - Never ? ?Diabetes: ? ?Is the patient diabetic?  Yes  ?If diabetic, was a CBG obtained today?  No  ?Did the patient bring in their glucometer from home?  No   ?How often do you monitor your CBG's? Patient states not checking blood sugars.  ? ?Financial Strains and Diabetes Management: ? ?Are you having any financial strains with the device, your supplies or

## 2021-06-28 ENCOUNTER — Ambulatory Visit (INDEPENDENT_AMBULATORY_CARE_PROVIDER_SITE_OTHER): Payer: Medicare Other

## 2021-06-28 VITALS — Ht 65.0 in | Wt 187.0 lb

## 2021-06-28 DIAGNOSIS — Z0000 Encounter for general adult medical examination without abnormal findings: Secondary | ICD-10-CM

## 2021-06-28 NOTE — Patient Instructions (Addendum)
Carly Buckley , ?Thank you for taking time to complete your Medicare Wellness Visit. I appreciate your ongoing commitment to your health goals. Please review the following plan we discussed and let me know if I can assist you in the future.  ? ?Screening recommendations/referrals: ?Colonoscopy: up to date, next due 01/12/29 ?Mammogram: last completed 08/22/20, ordered 04/25/21, per our conversation call to schedule an appointment  ?Bone Density: last completed 08/09/17, ordered 04/25/21, per our conversation call to schedule an appointment  ?ophthalmology/optometry visit : per our conversation, you have an appointment scheduled in August ?Recommended yearly dental visit for hygiene and checkup ? ?Vaccinations: ?Influenza vaccine: up to date ?Pneumococcal vaccine: up to date  ?Tdap vaccine: due 04/16/16, medicare may cover in the event you are cut or injured  ?Shingles vaccine: Discuss with pharmacy   ?Covid-19:newest booster available at your local pharmacy ? ?Advanced directives: Please bring a copy of Living Will and/or Healthcare Power of Attorney for your chart. ? ? ?Conditions/risks identified: see problem list  ? ?Next appointment: Follow up in one year for your annual wellness visit  ? ? ?Preventive Care 76 Years and Older, Female ?Preventive care refers to lifestyle choices and visits with your health care provider that can promote health and wellness. ?What does preventive care include? ?A yearly physical exam. This is also called an annual well check. ?Dental exams once or twice a year. ?Routine eye exams. Ask your health care provider how often you should have your eyes checked. ?Personal lifestyle choices, including: ?Daily care of your teeth and gums. ?Regular physical activity. ?Eating a healthy diet. ?Avoiding tobacco and drug use. ?Limiting alcohol use. ?Practicing safe sex. ?Taking low-dose aspirin every day. ?Taking vitamin and mineral supplements as recommended by your health care provider. ?What happens  during an annual well check? ?The services and screenings done by your health care provider during your annual well check will depend on your age, overall health, lifestyle risk factors, and family history of disease. ?Counseling  ?Your health care provider may ask you questions about your: ?Alcohol use. ?Tobacco use. ?Drug use. ?Emotional well-being. ?Home and relationship well-being. ?Sexual activity. ?Eating habits. ?History of falls. ?Memory and ability to understand (cognition). ?Work and work Statistician. ?Reproductive health. ?Screening  ?You may have the following tests or measurements: ?Height, weight, and BMI. ?Blood pressure. ?Lipid and cholesterol levels. These may be checked every 5 years, or more frequently if you are over 68 years old. ?Skin check. ?Lung cancer screening. You may have this screening every year starting at age 49 if you have a 30-pack-year history of smoking and currently smoke or have quit within the past 15 years. ?Fecal occult blood test (FOBT) of the stool. You may have this test every year starting at age 38. ?Flexible sigmoidoscopy or colonoscopy. You may have a sigmoidoscopy every 5 years or a colonoscopy every 10 years starting at age 20. ?Hepatitis C blood test. ?Hepatitis B blood test. ?Sexually transmitted disease (STD) testing. ?Diabetes screening. This is done by checking your blood sugar (glucose) after you have not eaten for a while (fasting). You may have this done every 1-3 years. ?Bone density scan. This is done to screen for osteoporosis. You may have this done starting at age 35. ?Mammogram. This may be done every 1-2 years. Talk to your health care provider about how often you should have regular mammograms. ?Talk with your health care provider about your test results, treatment options, and if necessary, the need for more tests. ?Vaccines  ?  Your health care provider may recommend certain vaccines, such as: ?Influenza vaccine. This is recommended every  year. ?Tetanus, diphtheria, and acellular pertussis (Tdap, Td) vaccine. You may need a Td booster every 10 years. ?Zoster vaccine. You may need this after age 19. ?Pneumococcal 13-valent conjugate (PCV13) vaccine. One dose is recommended after age 62. ?Pneumococcal polysaccharide (PPSV23) vaccine. One dose is recommended after age 79. ?Talk to your health care provider about which screenings and vaccines you need and how often you need them. ?This information is not intended to replace advice given to you by your health care provider. Make sure you discuss any questions you have with your health care provider. ?Document Released: 04/29/2015 Document Revised: 12/21/2015 Document Reviewed: 02/01/2015 ?Elsevier Interactive Patient Education ? 2017 [place]. ? ?Fall Prevention in the Home ?Falls can cause injuries. They can happen to people of all ages. There are many things you can do to make your home safe and to help prevent falls. ?What can I do on the outside of my home? ?Regularly fix the edges of walkways and driveways and fix any cracks. ?Remove anything that might make you trip as you walk through a door, such as a raised step or threshold. ?Trim any bushes or trees on the path to your home. ?Use bright outdoor lighting. ?Clear any walking paths of anything that might make someone trip, such as rocks or tools. ?Regularly check to see if handrails are loose or broken. Make sure that both sides of any steps have handrails. ?Any raised decks and porches should have guardrails on the edges. ?Have any leaves, snow, or ice cleared regularly. ?Use sand or salt on walking paths during winter. ?Clean up any spills in your garage right away. This includes oil or grease spills. ?What can I do in the bathroom? ?Use night lights. ?Install grab bars by the toilet and in the tub and shower. Do not use towel bars as grab bars. ?Use non-skid mats or decals in the tub or shower. ?If you need to sit down in the shower, use a  plastic, non-slip stool. ?Keep the floor dry. Clean up any water that spills on the floor as soon as it happens. ?Remove soap buildup in the tub or shower regularly. ?Attach bath mats securely with double-sided non-slip rug tape. ?Do not have throw rugs and other things on the floor that can make you trip. ?What can I do in the bedroom? ?Use night lights. ?Make sure that you have a light by your bed that is easy to reach. ?Do not use any sheets or blankets that are too big for your bed. They should not hang down onto the floor. ?Have a firm chair that has side arms. You can use this for support while you get dressed. ?Do not have throw rugs and other things on the floor that can make you trip. ?What can I do in the kitchen? ?Clean up any spills right away. ?Avoid walking on wet floors. ?Keep items that you use a lot in easy-to-reach places. ?If you need to reach something above you, use a strong step stool that has a grab bar. ?Keep electrical cords out of the way. ?Do not use floor polish or wax that makes floors slippery. If you must use wax, use non-skid floor wax. ?Do not have throw rugs and other things on the floor that can make you trip. ?What can I do with my stairs? ?Do not leave any items on the stairs. ?Make sure that there are  handrails on both sides of the stairs and use them. Fix handrails that are broken or loose. Make sure that handrails are as long as the stairways. ?Check any carpeting to make sure that it is firmly attached to the stairs. Fix any carpet that is loose or worn. ?Avoid having throw rugs at the top or bottom of the stairs. If you do have throw rugs, attach them to the floor with carpet tape. ?Make sure that you have a light switch at the top of the stairs and the bottom of the stairs. If you do not have them, ask someone to add them for you. ?What else can I do to help prevent falls? ?Wear shoes that: ?Do not have high heels. ?Have rubber bottoms. ?Are comfortable and fit you  well. ?Are closed at the toe. Do not wear sandals. ?If you use a stepladder: ?Make sure that it is fully opened. Do not climb a closed stepladder. ?Make sure that both sides of the stepladder are locked into place. ?Ask some

## 2021-07-03 ENCOUNTER — Other Ambulatory Visit: Payer: Self-pay | Admitting: Primary Care

## 2021-07-03 DIAGNOSIS — E785 Hyperlipidemia, unspecified: Secondary | ICD-10-CM

## 2021-07-10 ENCOUNTER — Other Ambulatory Visit: Payer: Self-pay | Admitting: Primary Care

## 2021-07-10 DIAGNOSIS — E1140 Type 2 diabetes mellitus with diabetic neuropathy, unspecified: Secondary | ICD-10-CM

## 2021-07-12 ENCOUNTER — Ambulatory Visit: Payer: Medicare Other | Admitting: Physician Assistant

## 2021-08-02 ENCOUNTER — Ambulatory Visit: Payer: Medicare Other | Admitting: Primary Care

## 2021-08-12 ENCOUNTER — Other Ambulatory Visit: Payer: Self-pay | Admitting: Cardiovascular Disease

## 2021-08-12 DIAGNOSIS — I480 Paroxysmal atrial fibrillation: Secondary | ICD-10-CM

## 2021-08-14 NOTE — Telephone Encounter (Signed)
Eliquis '5mg'$  refill request received. Patient is 75 years old, weight-84.8kg, Crea-0.92 on 05/12/2021, Diagnosis-Afib, and last seen by Dr. Acie Fredrickson on 02/06/2021. Dose is appropriate based on dosing criteria. Will send in refill to requested pharmacy.   ?

## 2021-08-18 ENCOUNTER — Ambulatory Visit: Payer: Medicare Other | Admitting: Primary Care

## 2021-08-22 ENCOUNTER — Ambulatory Visit: Payer: Medicare Other | Admitting: Physician Assistant

## 2021-08-30 ENCOUNTER — Ambulatory Visit: Payer: Medicare Other | Admitting: Primary Care

## 2021-09-05 ENCOUNTER — Ambulatory Visit: Payer: Medicare Other | Admitting: Physician Assistant

## 2021-09-07 ENCOUNTER — Ambulatory Visit (INDEPENDENT_AMBULATORY_CARE_PROVIDER_SITE_OTHER): Payer: Medicare Other | Admitting: Primary Care

## 2021-09-07 ENCOUNTER — Encounter: Payer: Self-pay | Admitting: Primary Care

## 2021-09-07 VITALS — BP 138/64 | HR 77 | Temp 98.8°F | Ht 65.0 in | Wt 186.0 lb

## 2021-09-07 DIAGNOSIS — E1140 Type 2 diabetes mellitus with diabetic neuropathy, unspecified: Secondary | ICD-10-CM

## 2021-09-07 LAB — POCT GLYCOSYLATED HEMOGLOBIN (HGB A1C): Hemoglobin A1C: 8.1 % — AB (ref 4.0–5.6)

## 2021-09-07 MED ORDER — RYBELSUS 3 MG PO TABS: 3.0000 mg | ORAL_TABLET | Freq: Every day | ORAL | 0 refills | Status: DC

## 2021-09-07 NOTE — Assessment & Plan Note (Signed)
Improved but still uncontrolled.  Recommended trial of Trulicity or Ozempic once weekly, she declines as she doesn't want to inject medication.  Stop Tradjenta 5 mg. Start Rybelsus 3 mg daily x 30 days, then increase to 7 mg daily thereafter.  Continue Glipizide 10 mg BID. Continue metformin XR 1000 mg BID.  Foot exam today. Eye exam UTD. Managed on statin. Urine microalbumin UTD.  Follow up in 3 months.

## 2021-09-07 NOTE — Patient Instructions (Addendum)
Stop taking Tradjenta pill for diabetes.  Start taking semaglutide 3 mg once daily for diabetes. We will increase your dose to 7 mg after 30 days.  Continue glipizide and metformin for diabetes.  Reduce consumption of breads, sugary food and drinks.  Please schedule a follow up visit for 3 months.  It was a pleasure to see you today!

## 2021-09-07 NOTE — Progress Notes (Signed)
Subjective:    Patient ID: Carly Buckley, female    DOB: 02/12/1946, 76 y.o.   MRN: [id number]  HPI  Carly Buckley is a very pleasant 76 y.o. female with a history of hypertension, atrial fibrillation, type 2 diabetes, osteoarthritis, hyperlipidemia who presents today for follow up of diabetes.  Current medications include: Glipizide 10 mg BID, metformin XR 1000 mg BID, Tradjenta 5 mg daily.  She is not checking glucose readings.   Last A1C: 8.7 in January 2023, 8.1 today Last Eye Exam: Due in August  Last Foot Exam: Due Pneumonia Vaccination: 2018 Urine Microalbumin: UTD Statin: atorvastatin   Dietary changes since last visit: None   Exercise: None   BP Readings from Last 3 Encounters:  09/07/21 138/64  04/25/21 138/64  01/17/21 (!) 150/72       Review of Systems  Respiratory:  Negative for shortness of breath.   Cardiovascular:  Negative for chest pain.  Neurological:  Positive for numbness. Negative for dizziness.        Past Medical History:  Diagnosis Date   Angioedema    Arthritis    Colon cancer ([place])    colon ca dx 07, colon resection   Complication of anesthesia    Diabetes mellitus    Diverticulitis    Dysrhythmia    palpitations   Helicobacter pylori (H. pylori)    Hypertension    Malignant neoplasm of colon    Neuropathy, idiopathic    PONV (postoperative nausea and vomiting)    Postoperative anemia due to acute blood loss 08/05/2012   Type II diabetes mellitus without mention of complication     Social History   Socioeconomic History   Marital status: Married    Spouse name: Not on file   Number of children: Not on file   Years of education: Not on file   Highest education level: Not on file  Occupational History   Not on file  Tobacco Use   Smoking status: Former    Types: Cigarettes    Quit date: 06/18/1972    Years since quitting: 49.2   Smokeless tobacco: Never  Vaping Use   Vaping Use: Never used  Substance and Sexual  Activity   Alcohol use: No   Drug use: No   Sexual activity: Not Currently  Other Topics Concern   Not on file  Social History Narrative   Married.   4 children, 7 grandchildren, 3 great grandchildren.   Retired. Once worked at [employer]	[place].   Enjoys walking, puzzle books, spending time with family.   Social Determinants of Health   Financial Resource Strain: Low Risk    Difficulty of Paying Living Expenses: Not hard at all  Food Insecurity: No Food Insecurity   Worried About Charity fundraiser in the Last Year: Never true   [place] in the Last Year: Never true  Transportation Needs: No Transportation Needs   Lack of Transportation (Medical): No   Lack of Transportation (Non-Medical): No  Physical Activity: Sufficiently Active   Days of Exercise per Week: 5 days   Minutes of Exercise per Session: 40 min  Stress: No Stress Concern Present   Feeling of Stress : Not at all  Social Connections: Moderately Integrated   Frequency of Communication with Friends and Family: More than three times a week   Frequency of Social Gatherings with Friends and Family: More than three times a week   Attends Religious Services: More than 4  times per year   Active Member of Clubs or Organizations: No   Attends Archivist Meetings: Never   Marital Status: Married  Human resources officer Violence: Not At Risk   Fear of Current or Ex-Partner: No   Emotionally Abused: No   Physically Abused: No   Sexually Abused: No    Past Surgical History:  Procedure Laterality Date   COLON RESECTION  2007   colon cancer   STERIOD INJECTION Left 08/04/2012   Procedure: STEROID INJECTION;  Surgeon: Gearlean Alf, MD;  Location: [facility];  Service: Orthopedics;  Laterality: Left;   TONSILLECTOMY     TOTAL KNEE ARTHROPLASTY Right 08/04/2012   Procedure: TOTAL KNEE ARTHROPLASTY;  Surgeon: Gearlean Alf, MD;  Location: [facility];  Service: Orthopedics;  Laterality: Right;   TUBAL LIGATION       Family History  Problem Relation Age of Onset   Lung cancer Mother 34   Kidney failure Father 59   Diabetes Sister    Diabetes Brother    Brain cancer Daughter     Allergies  Allergen Reactions   Lisinopril Swelling   Morphine And Related     Nauseated    Oxycodone Nausea Only    Current Outpatient Medications on File Prior to Visit  Medication Sig Dispense Refill   albuterol (VENTOLIN HFA) 108 (90 Base) MCG/ACT inhaler Inhale into the lungs as directed.     amLODipine (NORVASC) 10 MG tablet Take 1 tablet (10 mg total) by mouth daily. For blood pressure. 90 tablet 3   atorvastatin (LIPITOR) 20 MG tablet TAKE 1 TABLET BY MOUTH DAILY FOR CHOLESTEROL 90 tablet 2   blood glucose meter kit and supplies KIT Dispense based on patient and insurance preference. Use up to four times daily as directed. (FOR ICD-9 250.00, 250.01). 1 each 0   Calcium Carb-Cholecalciferol (CALCIUM+D3 PO) Take 1 tablet by mouth daily.     ELIQUIS 5 MG TABS tablet TAKE 1 TABLET BY MOUTH TWICE DAILY 60 tablet 5   glipiZIDE (GLUCOTROL) 10 MG tablet Take 1 tablet (10 mg total) by mouth 2 (two) times daily. for diabetes. 180 tablet 0   hydrochlorothiazide (HYDRODIURIL) 25 MG tablet TAKE 1 TABLET BY MOUTH DAILY FOR HIGH BLOOD PRESSURE 90 tablet 3   metFORMIN (GLUCOPHAGE-XR) 500 MG 24 hr tablet Take 2 tablets (1,000 mg total) by mouth 2 (two) times daily with a meal. For diabetes. 360 tablet 1   metoprolol succinate (TOPROL-XL) 100 MG 24 hr tablet TAKE 1 TABLET BY MOUTH DAILY WITH A MEAL 90 tablet 3   Multiple Vitamin (MULTIVITAMIN WITH MINERALS) TABS tablet Take 1 tablet by mouth daily.     potassium chloride (KLOR-CON) 10 MEQ tablet Take 10 mEq by mouth daily.     Propylene Glycol 0.6 % SOLN Place 1 drop into both eyes daily as needed (for dry eyes).     No current facility-administered medications on file prior to visit.    BP 138/64   Pulse 77   Temp 98.8 F (37.1 C) (Oral)   Ht _0  (1.651 m)   Wt 186  lb (84.4 kg)   SpO2 98%   BMI 30.95 kg/m  Objective:   Physical Exam Cardiovascular:     Rate and Rhythm: Normal rate and regular rhythm.  Pulmonary:     Effort: Pulmonary effort is normal.     Breath sounds: Normal breath sounds.  Musculoskeletal:     Cervical back: Neck supple.  Skin:  General: Skin is warm and dry.          Assessment & Plan:

## 2021-09-12 DIAGNOSIS — Z1231 Encounter for screening mammogram for malignant neoplasm of breast: Secondary | ICD-10-CM | POA: Diagnosis not present

## 2021-09-25 ENCOUNTER — Encounter: Payer: Self-pay | Admitting: Cardiovascular Disease

## 2021-09-25 ENCOUNTER — Ambulatory Visit: Payer: Medicare Other | Admitting: Cardiovascular Disease

## 2021-09-25 VITALS — BP 155/90 | HR 89 | Ht 65.5 in | Wt 187.4 lb

## 2021-09-25 DIAGNOSIS — I480 Paroxysmal atrial fibrillation: Secondary | ICD-10-CM

## 2021-09-25 DIAGNOSIS — I10 Essential (primary) hypertension: Secondary | ICD-10-CM | POA: Diagnosis not present

## 2021-09-25 MED ORDER — METOPROLOL SUCCINATE ER 100 MG PO TB24: ORAL_TABLET | ORAL | 3 refills | Status: DC

## 2021-09-25 NOTE — Patient Instructions (Signed)
Medication Instructions:  INCREASE Toprol XL (Metoprolol Succinate) to '100mg'$  twice daily *If you need a refill on your cardiac medications before your next appointment, please call your pharmacy*   Lab Work: NONE If you have labs (blood work) drawn today and your tests are completely normal, you will receive your results only by: [place] (if you have MyChart) OR A paper copy in the mail If you have any lab test that is abnormal or we need to change your treatment, we will call you to review the results.   Testing/Procedures: NONE   Follow-Up: At [facility], you and your health needs are our priority.  As part of our continuing mission to provide you with exceptional heart care, we have created designated Provider Care Teams.  These Care Teams include your primary Cardiologist (physician) and Advanced Practice Providers (APPs -  Physician Assistants and Nurse Practitioners) who all work together to provide you with the care you need, when you need it.  Your next appointment:   3 month(s)  The format for your next appointment:   In Person  Provider:   Richardson Dopp, PA {    Important Information About Sugar

## 2021-09-25 NOTE — Progress Notes (Signed)
OFFICE NOTE  Chief Complaint:   Occasional edema  Primary Care Physician: Pleas Koch, NP  Problem list 1. Essential hypertension 2. Atrial fibrillation 3. Diabetes Mellitus    HPI:  Carly Buckley  Is a pleasant 76 year old female kindly referred to me by Dr. Birdie Riddle for palpitations. She recently saw her in the office for a routine physical and was found to have atrial fibrillation. She does have a history of palpitations has gone on for number of years     July 22, 2015:  I'm seeing Carly Buckley again today after a several year absence.   She has been seen by Dr. Debara Pickett in the recent past.   I have seen her and her husband in the past.  No CP or dyspnea.   Does not check her BP at home .  Can tell when she goes into atrial fib - lasts a few minute /  Has been walking 2 miles a day  - has had some recent knee issues and she has slacked off a bit   03/06/2016:  No pains or problems   Aug 24, 2016: Doing well. Is not exercising as much as she would like .  Aug. 31, 2018: Carly Buckley is seen today for follow up of her paroxysmal atrial fib . Walks with a cane now.  Has neuropathy - helps with balance  Is maintaining NSR   July 18, 2017  Doing well.  Very few palpitations.   BP and HR are normal  Exercises some   September 22, 2019: Carly Buckley is seen today for follow up of her PAF.   Was last seen 2 years ago  Has occasional plapitations that only last a few seconds.  Never more than a minute  Not associated with dizziness or dyspnea.  Walks almost daily . Monday  - Friday .  1 - 2 miles a day   BP is elevated.   Still eating salty foods.   January 17, 2021:  Carly Buckley is seen back for follow up of her PAF and HTN.   Still eating salty foods .  No Cp or dyspnea  Had tongue swelling with Lisinopril Will avoid ACE-I and ARBs.  September 25, 2021: Carly Buckley seen today for follow-up visit.  She has a history of paroxysmal atrial relation and hypertension. Still eating salt Is not  exercising much   PMHx:  Past Medical History:  Diagnosis Date   Angioedema    Arthritis    Colon cancer ([place])    colon ca dx 07, colon resection   Complication of anesthesia    Diabetes mellitus    Diverticulitis    Dysrhythmia    palpitations   Helicobacter pylori (H. pylori)    Hypertension    Malignant neoplasm of colon    Neuropathy, idiopathic    PONV (postoperative nausea and vomiting)    Postoperative anemia due to acute blood loss 08/05/2012   Type II diabetes mellitus without mention of complication     Past Surgical History:  Procedure Laterality Date   COLON RESECTION  2007   colon cancer   STERIOD INJECTION Left 08/04/2012   Procedure: STEROID INJECTION;  Surgeon: Gearlean Alf, MD;  Location: [facility];  Service: Orthopedics;  Laterality: Left;   TONSILLECTOMY     TOTAL KNEE ARTHROPLASTY Right 08/04/2012   Procedure: TOTAL KNEE ARTHROPLASTY;  Surgeon: Gearlean Alf, MD;  Location: [facility];  Service: Orthopedics;  Laterality: Right;   TUBAL LIGATION  FAMHx:  Family History  Problem Relation Age of Onset   Lung cancer Mother 23   Kidney failure Father 59   Diabetes Sister    Diabetes Brother    Brain cancer Daughter     SOCHx:   reports that she quit smoking about 49 years ago. Her smoking use included cigarettes. She has never used smokeless tobacco. She reports that she does not drink alcohol and does not use drugs.  ALLERGIES:  Allergies  Allergen Reactions   Lisinopril Swelling   Morphine And Related     Nauseated    Oxycodone Nausea Only    ROS: Noted in current hx. Otherwise negative   HOME MEDS: Current Outpatient Medications  Medication Sig Dispense Refill   albuterol (VENTOLIN HFA) 108 (90 Base) MCG/ACT inhaler Inhale into the lungs as directed.     amLODipine (NORVASC) 10 MG tablet Take 1 tablet (10 mg total) by mouth daily. For blood pressure. 90 tablet 3   atorvastatin (LIPITOR) 20 MG tablet TAKE 1 TABLET BY MOUTH DAILY FOR  CHOLESTEROL 90 tablet 2   blood glucose meter kit and supplies KIT Dispense based on patient and insurance preference. Use up to four times daily as directed. (FOR ICD-9 250.00, 250.01). 1 each 0   Calcium Carb-Cholecalciferol (CALCIUM+D3 PO) Take 1 tablet by mouth daily.     ELIQUIS 5 MG TABS tablet TAKE 1 TABLET BY MOUTH TWICE DAILY 60 tablet 5   glipiZIDE (GLUCOTROL) 10 MG tablet Take 1 tablet (10 mg total) by mouth 2 (two) times daily. for diabetes. 180 tablet 0   hydrochlorothiazide (HYDRODIURIL) 25 MG tablet TAKE 1 TABLET BY MOUTH DAILY FOR HIGH BLOOD PRESSURE 90 tablet 3   metFORMIN (GLUCOPHAGE-XR) 500 MG 24 hr tablet Take 2 tablets (1,000 mg total) by mouth 2 (two) times daily with a meal. For diabetes. 360 tablet 1   metoprolol succinate (TOPROL-XL) 100 MG 24 hr tablet TAKE 1 TABLET BY MOUTH DAILY WITH A MEAL 90 tablet 3   Multiple Vitamin (MULTIVITAMIN WITH MINERALS) TABS tablet Take 1 tablet by mouth daily.     potassium chloride (KLOR-CON) 10 MEQ tablet Take 10 mEq by mouth daily.     Propylene Glycol 0.6 % SOLN Place 1 drop into both eyes daily as needed (for dry eyes).     Semaglutide (RYBELSUS) 3 MG TABS Take 3 mg by mouth daily. For diabetes (Patient not taking: Reported on 09/25/2021) 30 tablet 0   No current facility-administered medications for this visit.    LABS/IMAGING: No results found for this or any previous visit (from the past 48 hour(s)). No results found.  VITALS: BP (!) 148/68   Pulse 89   Ht 5' 5.5" (1.664 m)   Wt 187 lb 6.4 oz (85 kg)   SpO2 97%   BMI 30.71 kg/m    Physical Exam: Blood pressure (!) 148/68, pulse 89, height 5' 5.5" (1.664 m), weight 187 lb 6.4 oz (85 kg), SpO2 97 %.  GEN:  Well nourished, well developed in no acute distress HEENT: Normal NECK: No JVD; No carotid bruits LYMPHATICS: No lymphadenopathy CARDIAC: RRR , no murmurs, rubs, gallops RESPIRATORY:  Clear to auscultation without rales, wheezing or rhonchi  ABDOMEN: Soft,  non-tender, non-distended MUSCULOSKELETAL:  No edema; No deformity  SKIN: Warm and dry NEUROLOGIC:  Alert and oriented x 3    EKG:       Assessment/plan 1. Paroxysmal atrial fibrillation.     Her CHADS2VASC score is 4 -    .  cont. Eliquis 5 bid     2. Hypertension: She continues to eat lots of salt and salty foods.  She does not exercise.  We will increase her Toprol XL to 100 mg twice a day.  Encouraged weight loss.  I encouraged salt restriction and regular exercise. I suggested that we try some additional medications but she did not want to start any new medications.  3. Type 2 diabetes mellitus with hypertension:      Mertie Moores, MD  09/25/2021 3:21 PM    [address],  [place], [place]  [address] Pager [phone] Phone: [phone]; Fax: [phone]

## 2021-09-26 ENCOUNTER — Telehealth: Payer: Self-pay | Admitting: Primary Care

## 2021-09-26 NOTE — Telephone Encounter (Signed)
Pt called and said that the medication Semaglutide (RYBELSUS) 3 MG TABS was to expensive, She said it would have benn $250 out of her pocket after insurance and she cant afford that and wants to know if she can do something different. Callback is [phone]

## 2021-09-27 NOTE — Telephone Encounter (Signed)
Called patient let her know you are out of office and will address after you get back.

## 2021-09-28 NOTE — Telephone Encounter (Signed)
Insulin is the only remaining option.  I recommend Lantus Insulin 5 units injected once daily into the skin. Please explain that there are special insulin pens that make it very easy to use. She would not have to use a vial and syringe for insulin.   Let me know if she is interested and I will send in both the insulin pen and pen needles.   Please make sure she has a working glucometer.

## 2021-09-29 NOTE — Telephone Encounter (Signed)
Called patient she was very reluctant to start any injections "they are all expensive".  She thought kate would just let her work on it without medications. I asked her what her blood sugars have been she said she does not check. She does have glucometer but not sure how to work it. Patient is going to take to pharmacy today and get them to show her how to use. She will call back on Monday with several readings so that she can see how high her levels are and I will let kate know and we will go from there. I am going to hold this message so that I can call patient Monday if she does not call me back.

## 2021-10-03 NOTE — Telephone Encounter (Signed)
Called patient left message to call office to see if she was able to check readings over the weekend.

## 2021-10-09 NOTE — Telephone Encounter (Signed)
Left message to return call to our office.  

## 2021-10-10 ENCOUNTER — Telehealth: Payer: Self-pay

## 2021-10-10 NOTE — Telephone Encounter (Signed)
Patient called in with the blood sugar readings:  6/23 - 168 6/24 - 160 6/25 - 145 before lunch / 227 after lunch  6/26 - 156 6/27 - 121 before lunch / 134 after lunch

## 2021-10-11 ENCOUNTER — Telehealth: Payer: Self-pay

## 2021-10-11 NOTE — Telephone Encounter (Signed)
Noted, thank you

## 2021-10-11 NOTE — Progress Notes (Signed)
Rybelsus patient assistance forms with [employer] have been completed and uploaded.  Charlene Brooke, CPP notified  Marijean Niemann, [place] Clinical Pharmacy Assistant [phone]

## 2021-10-11 NOTE — Telephone Encounter (Signed)
Spoke with patient, she should qualify for Rybelsus/Ozempic assistance. Pt would prefer to take a pill than do an injection. Will work on Chiropodist for [employer].  Amy - please upload forms.

## 2021-10-11 NOTE — Progress Notes (Signed)
    Chronic Care Management Pharmacy Assistant   Name: LOUINE TENPENNY  MRN: [id number] DOB: 1946/01/19  Reason for Encounter: CCM (Rybelsus 2023 PAP)   Patient assistance application for Rybelsus [employer]) has been completed and uploaded.  Charlene Brooke, CPP notified  Marijean Niemann, [place] Clinical Pharmacy Assistant [phone]

## 2021-10-12 NOTE — Telephone Encounter (Signed)
Printed forms and placed at front desk for patient signature.

## 2021-10-13 ENCOUNTER — Other Ambulatory Visit: Payer: Self-pay | Admitting: Primary Care

## 2021-10-13 DIAGNOSIS — E1140 Type 2 diabetes mellitus with diabetic neuropathy, unspecified: Secondary | ICD-10-CM

## 2021-10-26 NOTE — Telephone Encounter (Signed)
Patient husband dropped off [employer] Patient Garment/textile technologist paperwork and has been placed in Conservation officer, historic buildings.

## 2021-10-31 ENCOUNTER — Telehealth: Payer: Self-pay | Admitting: Cardiovascular Disease

## 2021-10-31 NOTE — Telephone Encounter (Signed)
Per OV note on 09/25/21:  2. Hypertension: She continues to eat lots of salt and salty foods.  She does not exercise.  We will increase her Toprol XL to 100 mg twice a day.  Medication was sent to [facility] for one year supply same day as OV. Called and spoke to patient to inform her, she states they will call the pharmacy. Will call us if there's an issue.

## 2021-10-31 NOTE — Telephone Encounter (Signed)
*  STAT* If patient is at the pharmacy, call can be transferred to refill team.   1. Which medications need to be refilled? (please list name of each medication and dose if known) Metoprolol  2. Which pharmacy/location (including street and city if local pharmacy) is medication to be sent to? [facility]  3. Do they need a 30 day or 90 day supply? 90 days and refills-please call today- completely out of it

## 2021-11-03 NOTE — Telephone Encounter (Signed)
Faxed completed application to [employer]. Will follow up in 3-5 business day with manufacturer.

## 2021-11-07 ENCOUNTER — Telehealth: Payer: Self-pay

## 2021-11-07 NOTE — Progress Notes (Signed)
Patient has been approved for Rybelsus patient assistance with Novo Nordisk 11/07/2021 -  04/15/2022. Patient is on auto-refill with medication being delivered to office. Patient can expect delivery to the office within 10 - 14 business days. I called patient to inform her; no answer; left message.   Charlene Brooke, CPP notified  Marijean Niemann, [place] Clinical Pharmacy Assistant [phone]

## 2021-12-01 ENCOUNTER — Telehealth: Payer: Self-pay | Admitting: Primary Care

## 2021-12-01 NOTE — Telephone Encounter (Signed)
Left message to return call to our office.  Meds have been logged. Not refrigerated. Have placed in cabinet at reception.

## 2021-12-01 NOTE — Telephone Encounter (Signed)
Please call patient:  We received her first 2 months of Rybelsus (diabetes medication). She is to start with the 3 mg dose, take 1 tablet by mouth once daily for 30 days, then increase to the 7 mg dose, 1 tablet by mouth once daily.  Placed both bottles on Joellen's desk.

## 2021-12-01 NOTE — Telephone Encounter (Signed)
Patient husband came in office to pick up meds.

## 2021-12-04 NOTE — Telephone Encounter (Signed)
Logged out in book no further action needed.

## 2021-12-05 ENCOUNTER — Other Ambulatory Visit: Payer: Self-pay | Admitting: Primary Care

## 2021-12-05 DIAGNOSIS — E1140 Type 2 diabetes mellitus with diabetic neuropathy, unspecified: Secondary | ICD-10-CM

## 2021-12-08 ENCOUNTER — Encounter: Payer: Self-pay | Admitting: Primary Care

## 2021-12-08 ENCOUNTER — Ambulatory Visit (INDEPENDENT_AMBULATORY_CARE_PROVIDER_SITE_OTHER): Payer: Medicare Other | Admitting: Primary Care

## 2021-12-08 VITALS — BP 150/60 | HR 70 | Temp 98.3°F | Resp 16 | Ht 65.5 in | Wt 181.4 lb

## 2021-12-08 DIAGNOSIS — E1140 Type 2 diabetes mellitus with diabetic neuropathy, unspecified: Secondary | ICD-10-CM | POA: Diagnosis not present

## 2021-12-08 LAB — POCT GLYCOSYLATED HEMOGLOBIN (HGB A1C): Hemoglobin A1C: 9.4 % — AB (ref 4.0–5.6)

## 2021-12-08 NOTE — Assessment & Plan Note (Signed)
Uncontrolled and worse with A1C of 9.4 today.   Long discussion today regarding her uncontrolled diabetes and potential harmful effects. Over the last several visits she has declined insulin.  She just began Rybelsus 3 mg daily this week. Will have her continue Rybelsus 3 mg daily, then increase to 7 mg next month. Continue glipizide 10 mg BID, metformin XR 1000 mg BID.  Consider Lantus insulin in 3 months if A1C >8. She will think about this. Strongly advised to start checking glucose readings.  Follow up in 3 months.

## 2021-12-08 NOTE — Patient Instructions (Signed)
Start checking your blood sugar levels.  Appropriate times to check your blood sugar levels are:  -Before any meal (breakfast, lunch, dinner) -Two hours after any meal (breakfast, lunch, dinner) -Bedtime  Record your readings and notify me if you continue to consistently run at or above 150.   Continue taking Rybelsus 3 mg daily for diabetes. Start Rybelsus 7 mg daily for diabetes once you finish your 3 mg bottle.  Start walking every day. Continue to work on [employer].  Please schedule a follow up visit for 3 months for diabetes check.  It was a pleasure to see you today!

## 2021-12-08 NOTE — Progress Notes (Signed)
Subjective:    Patient ID: Carly Buckley, female    DOB: Dec 17, 1945, 77 y.o.   MRN: [id number]  Diabetes Pertinent negatives for hypoglycemia include no dizziness. Pertinent negatives for diabetes include no chest pain.    Carly Buckley is a very pleasant 76 y.o. female with a history of uncontrolled type 2 diabetes, atrial fibrillation, hypertension, osteoarthritis who presents today for follow-up of diabetes.  Current medications include: Glipizide 10 mg twice daily, metformin XR 1000 mg twice daily, Rybelsus 3 mg daily x1 month then to increase to 7 mg daily thereafter.  She started her Rybelsus four days ago.   She is checking her blood glucose 0 times daily now. She was checking her glucose levels in June and July but doesn't remember the readings.   Last A1C: 8.1 in May 2023, 9.4 today Last Eye Exam: Due Last Foot Exam: Up-to-date Pneumonia Vaccination: 2018 Urine Microalbumin: January 2023 Statin: Atorvastatin  Dietary changes since last visit: She endorses a healthy diet. She does not eat sweets, fried foods often.    Exercise: She is not exercising     Review of Systems  Respiratory:  Negative for shortness of breath.   Cardiovascular:  Negative for chest pain.  Neurological:  Positive for numbness. Negative for dizziness.         Past Medical History:  Diagnosis Date   Angioedema    Arthritis    Colon cancer ([place])    colon ca dx 07, colon resection   Complication of anesthesia    Diabetes mellitus    Diverticulitis    Dysrhythmia    palpitations   Helicobacter pylori (H. pylori)    Hypertension    Malignant neoplasm of colon    Neuropathy, idiopathic    PONV (postoperative nausea and vomiting)    Postoperative anemia due to acute blood loss 08/05/2012   Type II diabetes mellitus without mention of complication     Social History   Socioeconomic History   Marital status: Married    Spouse name: Not on file   Number of children: Not on file    Years of education: Not on file   Highest education level: Not on file  Occupational History   Not on file  Tobacco Use   Smoking status: Former    Types: Cigarettes    Quit date: 06/18/1972    Years since quitting: 49.5   Smokeless tobacco: Never  Vaping Use   Vaping Use: Never used  Substance and Sexual Activity   Alcohol use: No   Drug use: No   Sexual activity: Not Currently  Other Topics Concern   Not on file  Social History Narrative   Married.   4 children, 7 grandchildren, 3 great grandchildren.   Retired. Once worked at [employer]	[place].   Enjoys walking, puzzle books, spending time with family.   Social Determinants of Health   Financial Resource Strain: Low Risk  (06/28/2021)   Overall Financial Resource Strain (CARDIA)    Difficulty of Paying Living Expenses: Not hard at all  Food Insecurity: No Food Insecurity (06/28/2021)   Hunger Vital Sign    Worried About Running Out of Food in the Last Year: Never true    Ran Out of Food in the Last Year: Never true  Transportation Needs: No Transportation Needs (06/28/2021)   PRAPARE - Hydrologist (Medical): No    Lack of Transportation (Non-Medical): No  Physical Activity: Sufficiently Active (06/28/2021)  Exercise Vital Sign    Days of Exercise per Week: 5 days    Minutes of Exercise per Session: 40 min  Stress: No Stress Concern Present (06/28/2021)   [place]    Feeling of Stress : Not at all  Social Connections: Moderately Integrated (06/28/2021)   Social Connection and Isolation Panel [NHANES]    Frequency of Communication with Friends and Family: More than three times a week    Frequency of Social Gatherings with Friends and Family: More than three times a week    Attends Religious Services: More than 4 times per year    Active Member of [employer] or Organizations: No    Attends Archivist Meetings: Never     Marital Status: Married  Human resources officer Violence: Not At Risk (06/28/2021)   Humiliation, Afraid, Rape, and Kick questionnaire    Fear of Current or Ex-Partner: No    Emotionally Abused: No    Physically Abused: No    Sexually Abused: No    Past Surgical History:  Procedure Laterality Date   COLON RESECTION  2007   colon cancer   STERIOD INJECTION Left 08/04/2012   Procedure: STEROID INJECTION;  Surgeon: Gearlean Alf, MD;  Location: [facility];  Service: Orthopedics;  Laterality: Left;   TONSILLECTOMY     TOTAL KNEE ARTHROPLASTY Right 08/04/2012   Procedure: TOTAL KNEE ARTHROPLASTY;  Surgeon: Gearlean Alf, MD;  Location: [facility];  Service: Orthopedics;  Laterality: Right;   TUBAL LIGATION      Family History  Problem Relation Age of Onset   Lung cancer Mother 43   Kidney failure Father 65   Diabetes Sister    Diabetes Brother    Brain cancer Daughter     Allergies  Allergen Reactions   Lisinopril Swelling   Morphine And Related     Nauseated    Oxycodone Nausea Only    Current Outpatient Medications on File Prior to Visit  Medication Sig Dispense Refill   albuterol (VENTOLIN HFA) 108 (90 Base) MCG/ACT inhaler Inhale into the lungs as directed.     amLODipine (NORVASC) 10 MG tablet Take 1 tablet (10 mg total) by mouth daily. For blood pressure. 90 tablet 3   atorvastatin (LIPITOR) 20 MG tablet TAKE 1 TABLET BY MOUTH DAILY FOR CHOLESTEROL 90 tablet 2   blood glucose meter kit and supplies KIT Dispense based on patient and insurance preference. Use up to four times daily as directed. (FOR ICD-9 250.00, 250.01). 1 each 0   Calcium Carb-Cholecalciferol (CALCIUM+D3 PO) Take 1 tablet by mouth daily.     ELIQUIS 5 MG TABS tablet TAKE 1 TABLET BY MOUTH TWICE DAILY 60 tablet 5   glipiZIDE (GLUCOTROL) 10 MG tablet Take 1 tablet (10 mg total) by mouth 2 (two) times daily. for diabetes. Office visit required for further refills. 180 tablet 0   glucose blood (ONETOUCH VERIO) test  strip USE FOUR TIMES DAILY AS DIRECTED 300 strip 1   hydrochlorothiazide (HYDRODIURIL) 25 MG tablet TAKE 1 TABLET BY MOUTH DAILY FOR HIGH BLOOD PRESSURE 90 tablet 3   metFORMIN (GLUCOPHAGE-XR) 500 MG 24 hr tablet Take 2 tablets (1,000 mg total) by mouth 2 (two) times daily with a meal. For diabetes. 360 tablet 1   metoprolol succinate (TOPROL-XL) 100 MG 24 hr tablet Take 1 tablet by mouth twice daily with or immediately following a meal 180 tablet 3   Multiple Vitamin (MULTIVITAMIN WITH MINERALS) TABS tablet  Take 1 tablet by mouth daily.     potassium chloride (KLOR-CON) 10 MEQ tablet Take 10 mEq by mouth daily.     Propylene Glycol 0.6 % SOLN Place 1 drop into both eyes daily as needed (for dry eyes).     Semaglutide (RYBELSUS) 3 MG TABS Take 3 mg by mouth daily. For diabetes 30 tablet 0   No current facility-administered medications on file prior to visit.    BP (!) 150/60   Pulse 70   Temp 98.3 F (36.8 C)   Resp 16   Ht 5' 5.5" (1.664 m)   Wt 181 lb 6 oz (82.3 kg)   SpO2 98%   BMI 29.72 kg/m  Objective:   Physical Exam Cardiovascular:     Rate and Rhythm: Normal rate and regular rhythm.  Pulmonary:     Effort: Pulmonary effort is normal.     Breath sounds: Normal breath sounds.  Musculoskeletal:     Cervical back: Neck supple.  Skin:    General: Skin is warm and dry.           Assessment & Plan:   Problem List Items Addressed This Visit       Endocrine   DM (diabetes mellitus) ([facility]) - Primary    Uncontrolled and worse with A1C of 9.4 today.   Long discussion today regarding her uncontrolled diabetes and potential harmful effects. Over the last several visits she has declined insulin.  She just began Rybelsus 3 mg daily this week. Will have her continue Rybelsus 3 mg daily, then increase to 7 mg next month. Continue glipizide 10 mg BID, metformin XR 1000 mg BID.  Consider Lantus insulin in 3 months if A1C >8. She will think about this. Strongly advised to  start checking glucose readings.  Follow up in 3 months.      Relevant Orders   POCT glycosylated hemoglobin (Hb A1C) (Completed)       Pleas Koch, NP

## 2021-12-13 DIAGNOSIS — M1712 Unilateral primary osteoarthritis, left knee: Secondary | ICD-10-CM | POA: Diagnosis not present

## 2021-12-13 DIAGNOSIS — M170 Bilateral primary osteoarthritis of knee: Secondary | ICD-10-CM | POA: Diagnosis not present

## 2021-12-21 DIAGNOSIS — M1712 Unilateral primary osteoarthritis, left knee: Secondary | ICD-10-CM | POA: Diagnosis not present

## 2021-12-27 ENCOUNTER — Ambulatory Visit: Payer: Medicare Other | Admitting: Physician Assistant

## 2022-01-02 DIAGNOSIS — M1712 Unilateral primary osteoarthritis, left knee: Secondary | ICD-10-CM | POA: Diagnosis not present

## 2022-01-08 ENCOUNTER — Ambulatory Visit: Payer: Medicare Other | Attending: Physician Assistant | Admitting: Physician Assistant

## 2022-01-08 ENCOUNTER — Encounter: Payer: Self-pay | Admitting: Physician Assistant

## 2022-01-08 VITALS — BP 142/70 | HR 77 | Ht 65.5 in | Wt 180.2 lb

## 2022-01-08 DIAGNOSIS — R319 Hematuria, unspecified: Secondary | ICD-10-CM | POA: Diagnosis not present

## 2022-01-08 DIAGNOSIS — I480 Paroxysmal atrial fibrillation: Secondary | ICD-10-CM

## 2022-01-08 DIAGNOSIS — E7800 Pure hypercholesterolemia, unspecified: Secondary | ICD-10-CM | POA: Diagnosis not present

## 2022-01-08 DIAGNOSIS — I10 Essential (primary) hypertension: Secondary | ICD-10-CM | POA: Diagnosis not present

## 2022-01-08 HISTORY — DX: Hematuria, unspecified: R31.9

## 2022-01-08 NOTE — Assessment & Plan Note (Signed)
Lipids optimal labs in January 2023.  Continue Lipitor 20 mg daily.

## 2022-01-08 NOTE — Assessment & Plan Note (Signed)
Maintaining sinus rhythm.  She has noted hematuria in the past.  I have recommend that she either follow-up with primary care or be referred to urology.  She prefers to follow-up with primary care.  She has an appointment in November.  If she has recurrent hematuria prior to that visit, she should be seen sooner.  Obtain follow-up BMET, CBC today.  Continue Eliquis 5 mg twice daily.  Follow-up in 6 months.

## 2022-01-08 NOTE — Assessment & Plan Note (Signed)
Blood pressure better controlled.  Blood pressure was closer to optimal initially.  On repeat by me it was elevated at 142/70.  She has lost about 8 pounds since her last visit.  She is now on semaglutide.  I suspect her blood pressure will continue to improve with weight loss.  I have asked her to continue to reduce salt intake.  Continue amlodipine 10 mg daily, hydrochlorothiazide 25 mg daily, Toprol-XL 100 mg twice daily.  Monitor blood pressure for 2 weeks and send readings for review.

## 2022-01-08 NOTE — Patient Instructions (Signed)
Medication Instructions:  Your physician recommends that you continue on your current medications as directed. Please refer to the Current Medication list given to you today.  *If you need a refill on your cardiac medications before your next appointment, please call your pharmacy*   Lab Work: TODAY:  BMET & CBC  If you have labs (blood work) drawn today and your tests are completely normal, you will receive your results only by: [facility] (if you have MyChart) OR A paper copy in the mail If you have any lab test that is abnormal or we need to change your treatment, we will call you to review the results.   Testing/Procedures: None ordered   Follow-Up: At [facility], you and your health needs are our priority.  As part of our continuing mission to provide you with exceptional heart care, we have created designated Provider Care Teams.  These Care Teams include your primary Cardiologist (physician) and Advanced Practice Providers (APPs -  Physician Assistants and Nurse Practitioners) who all work together to provide you with the care you need, when you need it.  We recommend signing up for the patient portal called "MyChart".  Sign up information is provided on this After Visit Summary.  MyChart is used to connect with patients for Virtual Visits (Telemedicine).  Patients are able to view lab/test results, encounter notes, upcoming appointments, etc.  Non-urgent messages can be sent to your provider as well.   To learn more about what you can do with MyChart, go to [email or web address].    Your next appointment:   07/06/22 arrive at 2:45  The format for your next appointment:   In Person  Provider:   Mertie Moores, MD     Other Instructions Follow up with your Primary Care Dr about the blood in your urine, if it happens again before you see him, you need to call him.  Your physician has requested that you regularly monitor and record your blood pressure readings  at home. Please use the same machine at the same time of day to check your readings and record them to bring to your follow-up visit.   Please monitor blood pressures and keep a log of your readings for 2 weeks and call the office with those results.    Make sure to check 2 hours after your medications.    AVOID these things for 30 minutes before checking your blood pressure: No Drinking caffeine. No Drinking alcohol. No Eating. No Smoking. No Exercising.   Five minutes before checking your blood pressure: Pee. Sit in a dining chair. Avoid sitting in a soft couch or armchair. Be quiet. Do not talk   Important Information About Sugar

## 2022-01-08 NOTE — Progress Notes (Signed)
Cardiology Office Note:    Date:  01/08/2022   ID:  [name], DOB [birth year], MRN [id number]  PCP:  Pleas Koch, NP  [facility] Cardiologist:  Mertie Moores, MD     Referring MD: Pleas Koch, NP   Chief Complaint:  F/u for AFib, HTN    Patient Profile: Paroxysmal atrial fibrillation Hypertension  Diabetes mellitus  Angioedema with ACEi Colon CA s/p resection in 2007 Myoview 05/2014: Low risk  Prior CV Studies:   Myoview 06/04/2014 Overall Impression:  Low risk stress nuclear study with a small, severe intensity, partially reversible apical defect consistent with apical thinning and mild apical ischemia.  EF 68  History of Present Illness:   Carly Buckley is a 76 y.o. female with the above problem list.  She was last seen by Dr. Acie Fredrickson in June 2023.  Her metoprolol succinate was increased due to uncontrolled blood pressure.  She returns for follow-up.  She is here with her husband.  She has been doing well without chest pain, significant shortness of breath, syncope, orthopnea.  She does have chronic leg edema.  This improves somewhat with elevation.        Past Medical History:  Diagnosis Date   Angioedema    Arthritis    Colon cancer ([place])    colon ca dx 07, colon resection   Complication of anesthesia    Diabetes mellitus    Diverticulitis    Dysrhythmia    palpitations   Helicobacter pylori (H. pylori)    Hypertension    Malignant neoplasm of colon    Neuropathy, idiopathic    PONV (postoperative nausea and vomiting)    Postoperative anemia due to acute blood loss 08/05/2012   Type II diabetes mellitus without mention of complication    Current Medications: Current Meds  Medication Sig   albuterol (VENTOLIN HFA) 108 (90 Base) MCG/ACT inhaler Inhale into the lungs as directed.   amLODipine (NORVASC) 10 MG tablet Take 1 tablet (10 mg total) by mouth daily. For blood pressure.   atorvastatin (LIPITOR) 20 MG tablet TAKE 1 TABLET  BY MOUTH DAILY FOR CHOLESTEROL   blood glucose meter kit and supplies KIT Dispense based on patient and insurance preference. Use up to four times daily as directed. (FOR ICD-9 250.00, 250.01).   Calcium Carb-Cholecalciferol (CALCIUM+D3 PO) Take 1 tablet by mouth daily.   ELIQUIS 5 MG TABS tablet TAKE 1 TABLET BY MOUTH TWICE DAILY   glipiZIDE (GLUCOTROL) 10 MG tablet Take 1 tablet (10 mg total) by mouth 2 (two) times daily. for diabetes. Office visit required for further refills.   glucose blood (ONETOUCH VERIO) test strip USE FOUR TIMES DAILY AS DIRECTED   hydrochlorothiazide (HYDRODIURIL) 25 MG tablet TAKE 1 TABLET BY MOUTH DAILY FOR HIGH BLOOD PRESSURE   metFORMIN (GLUCOPHAGE-XR) 500 MG 24 hr tablet Take 2 tablets (1,000 mg total) by mouth 2 (two) times daily with a meal. For diabetes.   metoprolol succinate (TOPROL-XL) 100 MG 24 hr tablet Take 1 tablet by mouth twice daily with or immediately following a meal   Multiple Vitamin (MULTIVITAMIN WITH MINERALS) TABS tablet Take 1 tablet by mouth daily.   potassium chloride (KLOR-CON) 10 MEQ tablet Take 10 mEq by mouth daily.   Propylene Glycol 0.6 % SOLN Place 1 drop into both eyes daily as needed (for dry eyes).   Semaglutide (RYBELSUS) 3 MG TABS Take 3 mg by mouth daily. For diabetes    Allergies:   Lisinopril,  Morphine and related, and Oxycodone   Social History   Tobacco Use   Smoking status: Former    Types: Cigarettes    Quit date: 06/18/1972    Years since quitting: 49.5   Smokeless tobacco: Never  Vaping Use   Vaping Use: Never used  Substance Use Topics   Alcohol use: No   Drug use: No    Family Hx: The patient's family history includes Brain cancer in her daughter; Diabetes in her brother and sister; Kidney failure (age of onset: 93) in her father; Lung cancer (age of onset: 70) in her mother.  Review of Systems  Gastrointestinal:  Negative for hematochezia and melena.  Genitourinary:  Positive for hematuria.      EKGs/Labs/Other Test Reviewed:    EKG:  EKG is   ordered today.  The ekg ordered today demonstrates NSR, HR 77, first-degree AV block, PR interval 240, poor wave progression, no ST-T wave changes, QTc 448  Recent Labs: 01/17/2021: Hemoglobin 12.9; Platelets 204 04/25/2021: ALT 19 05/12/2021: BUN 26; Creatinine, Ser 0.92; Sodium 139 06/12/2021: Potassium 4.1   Recent Lipid Panel Recent Labs    04/25/21 0944  CHOL 109  TRIG 46.0  HDL 47.30  VLDL 9.2  LDLCALC 52     Risk Assessment/Calculations/Metrics:    CHA2DS2-VASc Score = 5   This indicates a 7.2% annual risk of stroke. The patient's score is based upon: CHF History: 0 HTN History: 1 Diabetes History: 1 Stroke History: 0 Vascular Disease History: 0 Age Score: 2 Gender Score: 1        HYPERTENSION CONTROL Vitals:   01/08/22 1525 01/08/22 1644  BP: 130/60 (!) 142/70    The patient's blood pressure is elevated above target today.  In order to address the patient's elevated BP: Blood pressure will be monitored at home to determine if medication changes need to be made.       Physical Exam:    VS:  BP (!) 142/70   Pulse 77   Ht 5' 5.5" (1.664 m)   Wt 180 lb 3.2 oz (81.7 kg)   SpO2 96%   BMI 29.53 kg/m     Wt Readings from Last 3 Encounters:  01/08/22 180 lb 3.2 oz (81.7 kg)  12/08/21 181 lb 6 oz (82.3 kg)  09/25/21 187 lb 6.4 oz (85 kg)    Constitutional:      Appearance: Healthy appearance. Not in distress.  Neck:     Vascular: JVD normal.  Pulmonary:     Effort: Pulmonary effort is normal.     Breath sounds: No wheezing. No rales.  Cardiovascular:     Normal rate. Regular rhythm. Normal S1. Normal S2.      Murmurs: There is no murmur.  Edema:    Peripheral edema present.    Pretibial: bilateral trace edema of the pretibial area. Abdominal:     Palpations: Abdomen is soft.  Skin:    General: Skin is warm and dry.  Neurological:     Mental Status: Alert and oriented to person, place and  time.         ASSESSMENT & PLAN:   Paroxysmal atrial fibrillation (HCC) Maintaining sinus rhythm.  She has noted hematuria in the past.  I have recommend that she either follow-up with primary care or be referred to urology.  She prefers to follow-up with primary care.  She has an appointment in November.  If she has recurrent hematuria prior to that visit, she should be seen sooner.  Obtain follow-up BMET, CBC today.  Continue Eliquis 5 mg twice daily.  Follow-up in 6 months.  Hyperlipidemia Lipids optimal labs in January 2023.  Continue Lipitor 20 mg daily.  Essential hypertension Blood pressure better controlled.  Blood pressure was closer to optimal initially.  On repeat by me it was elevated at 142/70.  She has lost about 8 pounds since her last visit.  She is now on semaglutide.  I suspect her blood pressure will continue to improve with weight loss.  I have asked her to continue to reduce salt intake.  Continue amlodipine 10 mg daily, hydrochlorothiazide 25 mg daily, Toprol-XL 100 mg twice daily.  Monitor blood pressure for 2 weeks and send readings for review.  Hematuria As noted, I have asked her to follow-up with primary care for evaluation of hematuria.           Dispo:  Return in about 6 months (around 07/09/2022) for Routine Follow Up, w/ Dr. Acie Fredrickson.   Medication Adjustments/Labs and Tests Ordered: Current medicines are reviewed at length with the patient today.  Concerns regarding medicines are outlined above.  Tests Ordered: Orders Placed This Encounter  Procedures   Basic metabolic panel   CBC   EKG 12-Lead   Medication Changes: No orders of the defined types were placed in this encounter.  Signed, Richardson Dopp, PA-C  01/08/2022 4:44 PM    [address], [place], [place]  [address] Phone: [phone]; Fax: [phone]

## 2022-01-08 NOTE — Assessment & Plan Note (Signed)
As noted, I have asked her to follow-up with primary care for evaluation of hematuria.

## 2022-01-09 LAB — BASIC METABOLIC PANEL
BUN/Creatinine Ratio: 29 — ABNORMAL HIGH (ref 12–28)
BUN: 26 mg/dL (ref 8–27)
CO2: 19 mmol/L — ABNORMAL LOW (ref 20–29)
Calcium: 10.1 mg/dL (ref 8.7–10.3)
Chloride: 98 mmol/L (ref 96–106)
Creatinine, Ser: 0.89 mg/dL (ref 0.57–1.00)
Glucose: 198 mg/dL — ABNORMAL HIGH (ref 70–99)
Potassium: 3.8 mmol/L (ref 3.5–5.2)
Sodium: 139 mmol/L (ref 134–144)
eGFR: 67 mL/min/{1.73_m2} (ref >59)

## 2022-01-09 LAB — CBC
Hematocrit: 34.9 % (ref 34.0–46.6)
Hemoglobin: 11.6 g/dL (ref 11.1–15.9)
MCH: 29.2 pg (ref 26.6–33.0)
MCHC: 33.2 g/dL (ref 31.5–35.7)
MCV: 88 fL (ref 79–97)
Platelets: 244 10*3/uL (ref 150–450)
RBC: 3.97 x10E6/uL (ref 3.77–5.28)
RDW: 14.8 % (ref 11.7–15.4)
WBC: 7.4 10*3/uL (ref 3.4–10.8)

## 2022-01-17 NOTE — Progress Notes (Signed)
Pt has been made aware of normal result and verbalized understanding.  jw

## 2022-01-18 ENCOUNTER — Other Ambulatory Visit: Payer: Self-pay | Admitting: Cardiovascular Disease

## 2022-01-23 ENCOUNTER — Telehealth: Payer: Self-pay

## 2022-01-23 NOTE — Telephone Encounter (Signed)
Left message to call office back inquiring about getting a flu shot.   

## 2022-02-09 ENCOUNTER — Other Ambulatory Visit: Payer: Self-pay | Admitting: Cardiovascular Disease

## 2022-02-09 DIAGNOSIS — I480 Paroxysmal atrial fibrillation: Secondary | ICD-10-CM

## 2022-02-09 NOTE — Telephone Encounter (Signed)
Eliquis '5mg'$  refill request received. Patient is 76 years old, weight-81.7kg, Crea-0.89 on 01/08/2022, Diagnosis-Afib, and last seen by Richardson Dopp on 01/08/2022. Dose is appropriate based on dosing criteria. Will send in refill to requested pharmacy.

## 2022-02-22 ENCOUNTER — Other Ambulatory Visit: Payer: Self-pay | Admitting: Primary Care

## 2022-02-22 DIAGNOSIS — E1140 Type 2 diabetes mellitus with diabetic neuropathy, unspecified: Secondary | ICD-10-CM

## 2022-03-13 ENCOUNTER — Ambulatory Visit: Payer: Medicare Other | Admitting: Primary Care

## 2022-03-20 ENCOUNTER — Encounter: Payer: Self-pay | Admitting: Primary Care

## 2022-03-20 ENCOUNTER — Ambulatory Visit (INDEPENDENT_AMBULATORY_CARE_PROVIDER_SITE_OTHER): Payer: Medicare Other | Admitting: Primary Care

## 2022-03-20 VITALS — BP 160/58 | HR 82 | Temp 98.1°F | Ht 65.5 in | Wt 183.0 lb

## 2022-03-20 DIAGNOSIS — I10 Essential (primary) hypertension: Secondary | ICD-10-CM

## 2022-03-20 DIAGNOSIS — E1140 Type 2 diabetes mellitus with diabetic neuropathy, unspecified: Secondary | ICD-10-CM | POA: Diagnosis not present

## 2022-03-20 DIAGNOSIS — Z23 Encounter for immunization: Secondary | ICD-10-CM | POA: Diagnosis not present

## 2022-03-20 LAB — POCT GLYCOSYLATED HEMOGLOBIN (HGB A1C): Hemoglobin A1C: 8 % — AB (ref 4.0–5.6)

## 2022-03-20 NOTE — Patient Instructions (Addendum)
Start monitoring your blood pressure daily, around the same time of day, for the next 2-3 weeks.  Ensure that you have rested for 30 minutes prior to checking your blood pressure.   Record your readings and call us with readings in 2 weeks.   Please schedule a physical to meet with me in 3 months.   It was a pleasure to see you today!

## 2022-03-20 NOTE — Assessment & Plan Note (Signed)
Improved with A1C of 8.0. Discussed that I would like to see A1C below 8.0.   Continue metformin XR 1000 mg BID, glipizide 10 mg BID. Consider resuming Rybelsus 3 mg daily in the future.   Continue walking, work on diet.  Follow up in 3 months.

## 2022-03-20 NOTE — Progress Notes (Signed)
Subjective:    Patient ID: Carly Buckley, female    DOB: Mar 09, 1946, 76 y.o.   MRN: [id number]  HPI  Carly Buckley is a very pleasant 76 y.o. female with a history of type 2 diabetes, hypertension, atrial fibrillation, hyperlipidemia who presents today for follow up of diabetes.   Current medications include: Glipizide 10 mg BID, metformin XR 1000 mg BID, Rybelsus 3 mg daily.  She couldn't tolerate the increased dose of 7 mg daily of Rybelsus.   She is checking her blood glucose 0 times daily.  Last A1C: 9.4 in August 2023, 8.0 today Last Eye Exam: Due Last Foot Exam: UTD Pneumonia Vaccination: 2018 Urine Microalbumin: UTD Statin: atorvastatin   Dietary changes since last visit: No changes.    Exercise: Walking at the [employer] a few times weekly.   BP Readings from Last 3 Encounters:  03/20/22 (!) 160/58  01/08/22 (!) 142/70  12/08/21 (!) 150/60   She does not check BP at home. She denies headaches, dizziness, blurred vision.     Review of Systems  Eyes:  Negative for visual disturbance.  Cardiovascular:  Negative for chest pain.  Neurological:  Negative for dizziness, numbness and headaches.         Past Medical History:  Diagnosis Date   Angioedema    Arthritis    Colon cancer ([name])    colon ca dx 07, colon resection   Complication of anesthesia    Diabetes mellitus    Diverticulitis    Dysrhythmia    palpitations   Helicobacter pylori (H. pylori)    Hypertension    Malignant neoplasm of colon    Neuropathy, idiopathic    PONV (postoperative nausea and vomiting)    Postoperative anemia due to acute blood loss 08/05/2012   Type II diabetes mellitus without mention of complication     Social History   Socioeconomic History   Marital status: Married    Spouse name: Not on file   Number of children: Not on file   Years of education: Not on file   Highest education level: Not on file  Occupational History   Not on file  Tobacco Use    Smoking status: Former    Types: Cigarettes    Quit date: 06/18/1972    Years since quitting: 49.7   Smokeless tobacco: Never  Vaping Use   Vaping Use: Never used  Substance and Sexual Activity   Alcohol use: No   Drug use: No   Sexual activity: Not Currently  Other Topics Concern   Not on file  Social History Narrative   Married.   4 children, 7 grandchildren, 3 great grandchildren.   Retired. Once worked at [employer]	[place].   Enjoys walking, puzzle books, spending time with family.   Social Determinants of Health   Financial Resource Strain: Low Risk  (06/28/2021)   Overall Financial Resource Strain (CARDIA)    Difficulty of Paying Living Expenses: Not hard at all  Food Insecurity: No Food Insecurity (06/28/2021)   Hunger Vital Sign    Worried About Running Out of Food in the Last Year: Never true    Ran Out of Food in the Last Year: Never true  Transportation Needs: No Transportation Needs (06/28/2021)   PRAPARE - Hydrologist (Medical): No    Lack of Transportation (Non-Medical): No  Physical Activity: Sufficiently Active (06/28/2021)   Exercise Vital Sign    Days of Exercise per Week: 5  days    Minutes of Exercise per Session: 40 min  Stress: No Stress Concern Present (06/28/2021)   [place]    Feeling of Stress : Not at all  Social Connections: Moderately Integrated (06/28/2021)   Social Connection and Isolation Panel [NHANES]    Frequency of Communication with Friends and Family: More than three times a week    Frequency of Social Gatherings with Friends and Family: More than three times a week    Attends Religious Services: More than 4 times per year    Active Member of [employer] or Organizations: No    Attends Archivist Meetings: Never    Marital Status: Married  Human resources officer Violence: Not At Risk (06/28/2021)   Humiliation, Afraid, Rape, and Kick  questionnaire    Fear of Current or Ex-Partner: No    Emotionally Abused: No    Physically Abused: No    Sexually Abused: No    Past Surgical History:  Procedure Laterality Date   COLON RESECTION  2007   colon cancer   STERIOD INJECTION Left 08/04/2012   Procedure: STEROID INJECTION;  Surgeon: Gearlean Alf, MD;  Location: [facility];  Service: Orthopedics;  Laterality: Left;   TONSILLECTOMY     TOTAL KNEE ARTHROPLASTY Right 08/04/2012   Procedure: TOTAL KNEE ARTHROPLASTY;  Surgeon: Gearlean Alf, MD;  Location: [facility];  Service: Orthopedics;  Laterality: Right;   TUBAL LIGATION      Family History  Problem Relation Age of Onset   Lung cancer Mother 18   Kidney failure Father 30   Diabetes Sister    Diabetes Brother    Brain cancer Daughter     Allergies  Allergen Reactions   Lisinopril Swelling   Morphine And Related     Nauseated    Oxycodone Nausea Only    Current Outpatient Medications on File Prior to Visit  Medication Sig Dispense Refill   amLODipine (NORVASC) 10 MG tablet Take 1 tablet (10 mg total) by mouth daily. For blood pressure. 90 tablet 3   atorvastatin (LIPITOR) 20 MG tablet TAKE 1 TABLET BY MOUTH DAILY FOR CHOLESTEROL 90 tablet 2   blood glucose meter kit and supplies KIT Dispense based on patient and insurance preference. Use up to four times daily as directed. (FOR ICD-9 250.00, 250.01). 1 each 0   Calcium Carb-Cholecalciferol (CALCIUM+D3 PO) Take 1 tablet by mouth daily.     ELIQUIS 5 MG TABS tablet TAKE 1 TABLET BY MOUTH TWICE DAILY 60 tablet 5   glipiZIDE (GLUCOTROL) 10 MG tablet Take 1 tablet (10 mg total) by mouth 2 (two) times daily. for diabetes. Office visit required for further refills. 180 tablet 0   glucose blood (ONETOUCH VERIO) test strip USE FOUR TIMES DAILY AS DIRECTED 300 strip 1   hydrochlorothiazide (HYDRODIURIL) 25 MG tablet TAKE 1 TABLET BY MOUTH DAILY FOR HIGH BLOOD PRESSURE 90 tablet 3   metFORMIN (GLUCOPHAGE-XR) 500 MG 24 hr  tablet TAKE 2 TABLETS BY MOUTH TWICE DAILY 360 tablet 0   metoprolol succinate (TOPROL-XL) 100 MG 24 hr tablet Take 1 tablet by mouth twice daily with or immediately following a meal 180 tablet 3   Multiple Vitamin (MULTIVITAMIN WITH MINERALS) TABS tablet Take 1 tablet by mouth daily.     potassium chloride (KLOR-CON) 10 MEQ tablet TAKE 1 TABLET BY MOUTH DAILY 90 tablet 3   Propylene Glycol 0.6 % SOLN Place 1 drop into both eyes daily as  needed (for dry eyes).     albuterol (VENTOLIN HFA) 108 (90 Base) MCG/ACT inhaler Inhale into the lungs as directed. (Patient not taking: Reported on 03/20/2022)     No current facility-administered medications on file prior to visit.    BP (!) 160/58   Pulse 82   Temp 98.1 F (36.7 C) (Temporal)   Ht 5' 5.5" (1.664 m)   Wt 183 lb (83 kg)   SpO2 96%   BMI 29.99 kg/m  Objective:   Physical Exam Cardiovascular:     Rate and Rhythm: Normal rate and regular rhythm.  Pulmonary:     Effort: Pulmonary effort is normal.     Breath sounds: Normal breath sounds.  Musculoskeletal:     Cervical back: Neck supple.  Skin:    General: Skin is warm and dry.  Psychiatric:        Mood and Affect: Mood normal.           Assessment & Plan:   Problem List Items Addressed This Visit       Cardiovascular and Mediastinum   Essential hypertension    Above goal today, also during numerous prior visits.  Recommend she start checking home BP and call with readings in 2 weeks. She agrees and will do so.  Continue amlodipine 10 mg daily, metoprolol succinate 100 mg daily, and HCTZ 25 mg daily.        Endocrine   DM (diabetes mellitus) ([place]) - Primary    Improved with A1C of 8.0. Discussed that I would like to see A1C below 8.0.   Continue metformin XR 1000 mg BID, glipizide 10 mg BID. Consider resuming Rybelsus 3 mg daily in the future.   Continue walking, work on diet.  Follow up in 3 months.       Relevant Orders   POCT glycosylated hemoglobin  (Hb A1C) (Completed)       Pleas Koch, NP

## 2022-03-20 NOTE — Assessment & Plan Note (Signed)
Above goal today, also during numerous prior visits.  Recommend she start checking home BP and call with readings in 2 weeks. She agrees and will do so.  Continue amlodipine 10 mg daily, metoprolol succinate 100 mg daily, and HCTZ 25 mg daily.

## 2022-03-29 ENCOUNTER — Other Ambulatory Visit: Payer: Self-pay | Admitting: Primary Care

## 2022-03-29 DIAGNOSIS — E1140 Type 2 diabetes mellitus with diabetic neuropathy, unspecified: Secondary | ICD-10-CM

## 2022-04-03 ENCOUNTER — Other Ambulatory Visit: Payer: Self-pay | Admitting: Primary Care

## 2022-04-03 DIAGNOSIS — E785 Hyperlipidemia, unspecified: Secondary | ICD-10-CM

## 2022-04-21 ENCOUNTER — Other Ambulatory Visit: Payer: Self-pay | Admitting: Primary Care

## 2022-04-21 DIAGNOSIS — I10 Essential (primary) hypertension: Secondary | ICD-10-CM

## 2022-04-23 ENCOUNTER — Telehealth: Payer: Self-pay | Admitting: Primary Care

## 2022-04-23 NOTE — Telephone Encounter (Signed)
Patient husband came in office and dropped off blood pressure readings.

## 2022-04-23 NOTE — Telephone Encounter (Signed)
Noted. I have placed in [place] box for review.

## 2022-04-24 ENCOUNTER — Other Ambulatory Visit: Payer: Self-pay | Admitting: Primary Care

## 2022-04-24 DIAGNOSIS — I10 Essential (primary) hypertension: Secondary | ICD-10-CM

## 2022-04-24 NOTE — Telephone Encounter (Deleted)
Received refill request for hydrochlorothiazide.  I sent a 1 year supply in February 2023.  Does she still have some on hand at home?  Also, see phone note from blood pressure readings.

## 2022-04-24 NOTE — Telephone Encounter (Signed)
Please thank patient for the blood pressure readings.  Blood pressure readings look good so we will continue her current regimen of amlodipine 10 mg daily, hydrochlorothiazide 25 mg daily, metoprolol succinate 100 mg daily.

## 2022-04-24 NOTE — Telephone Encounter (Signed)
Called patient and reviewed all information. Patient verbalized understanding. Will call if any further questions.  

## 2022-05-02 ENCOUNTER — Telehealth: Payer: Self-pay | Admitting: Primary Care

## 2022-05-02 NOTE — Telephone Encounter (Signed)
LVM for pt to rtn my call to schedule AWV with [facility] call back # [phone] 

## 2022-05-10 ENCOUNTER — Telehealth: Payer: Self-pay | Admitting: Primary Care

## 2022-05-10 NOTE — Telephone Encounter (Signed)
LVM for pt to rtn my call to schedule AWV with [facility] call back # [phone]. Please schedule if patient calls the office.

## 2022-05-21 ENCOUNTER — Other Ambulatory Visit: Payer: Self-pay | Admitting: Primary Care

## 2022-05-21 DIAGNOSIS — I10 Essential (primary) hypertension: Secondary | ICD-10-CM

## 2022-06-07 ENCOUNTER — Telehealth: Payer: Self-pay

## 2022-06-07 DIAGNOSIS — E1140 Type 2 diabetes mellitus with diabetic neuropathy, unspecified: Secondary | ICD-10-CM

## 2022-06-07 MED ORDER — METFORMIN HCL ER 500 MG PO TB24: 1000.0000 mg | ORAL_TABLET | Freq: Two times a day (BID) | ORAL | 0 refills | Status: DC

## 2022-06-07 NOTE — Telephone Encounter (Signed)
Refills sent to pharmacy. 

## 2022-06-07 NOTE — Telephone Encounter (Signed)
Received refill request

## 2022-06-07 NOTE — Addendum Note (Signed)
Addended by: Pleas Koch on: 06/07/2022 09:34 AM   Modules accepted: Orders

## 2022-06-19 ENCOUNTER — Encounter: Payer: Medicare Other | Admitting: Primary Care

## 2022-07-02 ENCOUNTER — Ambulatory Visit: Payer: Medicare Other

## 2022-07-05 ENCOUNTER — Ambulatory Visit (INDEPENDENT_AMBULATORY_CARE_PROVIDER_SITE_OTHER): Payer: Medicare Other

## 2022-07-05 ENCOUNTER — Encounter: Payer: Self-pay | Admitting: Cardiovascular Disease

## 2022-07-05 VITALS — Ht 65.0 in | Wt 198.0 lb

## 2022-07-05 DIAGNOSIS — Z1231 Encounter for screening mammogram for malignant neoplasm of breast: Secondary | ICD-10-CM | POA: Diagnosis not present

## 2022-07-05 DIAGNOSIS — Z780 Asymptomatic menopausal state: Secondary | ICD-10-CM

## 2022-07-05 DIAGNOSIS — Z0000 Encounter for general adult medical examination without abnormal findings: Secondary | ICD-10-CM | POA: Diagnosis not present

## 2022-07-05 NOTE — Progress Notes (Unsigned)
OFFICE NOTE  Chief Complaint:   Occasional edema  Primary Care Physician: Pleas Koch, NP  Problem list 1. Essential hypertension 2. Atrial fibrillation 3. Diabetes Mellitus    HPI:  Carly Buckley  Is a pleasant 77 year old female kindly referred to me by Dr. Birdie Riddle for palpitations. She recently saw her in the office for a routine physical and was found to have atrial fibrillation. She does have a history of palpitations has gone on for number of years     July 22, 2015:  I'm seeing Carly Buckley again today after a several year absence.   She has been seen by Dr. Debara Pickett in the recent past.   I have seen her and her husband in the past.  No CP or dyspnea.   Does not check her BP at home .  Can tell when she goes into atrial fib - lasts a few minute /  Has been walking 2 miles a day  - has had some recent knee issues and she has slacked off a bit   03/06/2016:  No pains or problems   Aug 24, 2016: Doing well. Is not exercising as much as she would like .  Aug. 31, 2018: Carly Buckley is seen today for follow up of her paroxysmal atrial fib . Walks with a cane now.  Has neuropathy - helps with balance  Is maintaining NSR   July 18, 2017  Doing well.  Very few palpitations.   BP and HR are normal  Exercises some   September 22, 2019: Carly Buckley is seen today for follow up of her PAF.   Was last seen 2 years ago  Has occasional plapitations that only last a few seconds.  Never more than a minute  Not associated with dizziness or dyspnea.  Walks almost daily . Monday  - Friday .  1 - 2 miles a day   BP is elevated.   Still eating salty foods.   January 17, 2021:  Carly Buckley is seen back for follow up of her PAF and HTN.   Still eating salty foods .  No Cp or dyspnea  Had tongue swelling with Lisinopril Will avoid ACE-I and ARBs.  September 25, 2021: Carly Buckley seen today for follow-up visit.  She has a history of paroxysmal atrial relation and hypertension. Still eating salt Is not  exercising much    July 06, 2022  Carly Buckley is seen today for follow up . Hx of PAF, HTN  Her BP at home have been normal .  Eats hot dogs and bologna   Walks at the [employer] building  Usually a mile a day     PMHx:  Past Medical History:  Diagnosis Date   Angioedema    Arthritis    Colon cancer ([place])    colon ca dx 07, colon resection   Complication of anesthesia    Diabetes mellitus    Diverticulitis    Dysrhythmia    palpitations   Helicobacter pylori (H. pylori)    Hypertension    Malignant neoplasm of colon    Neuropathy, idiopathic    PONV (postoperative nausea and vomiting)    Postoperative anemia due to acute blood loss 08/05/2012   Type II diabetes mellitus without mention of complication     Past Surgical History:  Procedure Laterality Date   COLON RESECTION  2007   colon cancer   STERIOD INJECTION Left 08/04/2012   Procedure: STEROID INJECTION;  Surgeon: Gearlean Alf, MD;  Location: [facility];  Service: Orthopedics;  Laterality: Left;   TONSILLECTOMY     TOTAL KNEE ARTHROPLASTY Right 08/04/2012   Procedure: TOTAL KNEE ARTHROPLASTY;  Surgeon: Gearlean Alf, MD;  Location: [facility];  Service: Orthopedics;  Laterality: Right;   TUBAL LIGATION      FAMHx:  Family History  Problem Relation Age of Onset   Lung cancer Mother 40   Kidney failure Father 76   Diabetes Sister    Diabetes Brother    Brain cancer Daughter     SOCHx:   reports that she quit smoking about 50 years ago. Her smoking use included cigarettes. She has never used smokeless tobacco. She reports that she does not drink alcohol and does not use drugs.  ALLERGIES:  Allergies  Allergen Reactions   Lisinopril Swelling   Morphine And Related     Nauseated    Oxycodone Nausea Only    ROS: Noted in current hx. Otherwise negative   HOME MEDS: Current Outpatient Medications  Medication Sig Dispense Refill   amLODipine (NORVASC) 10 MG tablet Take 1 tablet (10 mg total) by mouth  daily. for blood pressure. 90 tablet 0   atorvastatin (LIPITOR) 20 MG tablet TAKE 1 TABLET BY MOUTH DAILY FOR CHOLESTEROL 90 tablet 0   blood glucose meter kit and supplies KIT Dispense based on patient and insurance preference. Use up to four times daily as directed. (FOR ICD-9 250.00, 250.01). 1 each 0   Calcium Carb-Cholecalciferol (CALCIUM+D3 PO) Take 1 tablet by mouth daily.     ELIQUIS 5 MG TABS tablet TAKE 1 TABLET BY MOUTH TWICE DAILY 60 tablet 5   glipiZIDE (GLUCOTROL) 10 MG tablet TAKE 1 TABLET BY MOUTH TWICE DAILY FOR DIABETES 180 tablet 1   glucose blood (ONETOUCH VERIO) test strip USE FOUR TIMES DAILY AS DIRECTED 300 strip 1   hydrochlorothiazide (HYDRODIURIL) 25 MG tablet TAKE 1 TABLET BY MOUTH DAILY FOR HIGH BLOOD PRESSURE 90 tablet 0   metFORMIN (GLUCOPHAGE-XR) 500 MG 24 hr tablet Take 2 tablets (1,000 mg total) by mouth 2 (two) times daily. 360 tablet 0   metoprolol succinate (TOPROL-XL) 100 MG 24 hr tablet Take 1 tablet by mouth twice daily with or immediately following a meal 180 tablet 3   Multiple Vitamin (MULTIVITAMIN WITH MINERALS) TABS tablet Take 1 tablet by mouth daily.     potassium chloride (KLOR-CON) 10 MEQ tablet TAKE 1 TABLET BY MOUTH DAILY 90 tablet 3   Propylene Glycol 0.6 % SOLN Place 1 drop into both eyes daily as needed (for dry eyes).     No current facility-administered medications for this visit.    LABS/IMAGING: No results found for this or any previous visit (from the past 48 hour(s)). No results found.  VITALS: BP (!) 142/65   Ht 5\' 5"  (1.651 m)   Wt 186 lb (84.4 kg)   BMI 30.95 kg/m    Physical Exam: Blood pressure (!) 142/65, height 5\' 5"  (1.651 m), weight 186 lb (84.4 kg).  HYPERTENSION CONTROL Vitals:   07/06/22 1504 07/06/22 1533  BP: (!) 150/70 (!) 142/65    The patient's blood pressure is elevated above target today. {Click here if intervention needs to be changed Refresh Note :1}  In order to address the patient's elevated BP:         GEN:  Well nourished, well developed in no acute distress HEENT: Normal NECK: No JVD; No carotid bruits LYMPHATICS: No lymphadenopathy CARDIAC: RRR , no murmurs, rubs, gallops RESPIRATORY:  Clear to auscultation without rales, wheezing or rhonchi  ABDOMEN: Soft, non-tender, non-distended MUSCULOSKELETAL:  No edema; No deformity  SKIN: Warm and dry NEUROLOGIC:  Alert and oriented x 3     EKG:       Assessment/plan 1. Paroxysmal atrial fibrillation.     Her CHADS2VASC score is 4 -    Remains on NSR     2. Hypertension:  still eating lots of salty foods   Encouraged weight loss.    3. Type 2 diabetes mellitus with hypertension:      Mertie Moores, MD  07/06/2022 3:40 PM    [facility] [place],  [place], [place]  [address] Pager [phone] Phone: [phone]; Fax: [phone]

## 2022-07-05 NOTE — Patient Instructions (Signed)
Carly Buckley , Thank you for taking time to come for your Medicare Wellness Visit. I appreciate your ongoing commitment to your health goals. Please review the following plan we discussed and let me know if I can assist you in the future.   These are the goals we discussed:  Goals      Follow up with Primary Care Provider     Starting 06/05/2018, I will continue to take medications as prescribed and to keep appointments as prescribed.      Patient Stated     09/16/2019, I will continue to walk everyday for 30 minutes.      Patient Stated     Would like to maintain current routine      Patient Stated     No new goals        This is a list of the screening recommended for you and due dates:  Health Maintenance  Topic Date Due   Zoster (Shingles) Vaccine (1 of 2) Never done   DTaP/Tdap/Td vaccine (2 - Tdap) 04/16/2016   Eye exam for diabetics  04/27/2021   Mammogram  08/22/2021   Yearly kidney health urinalysis for diabetes  04/25/2022   Complete foot exam   09/08/2022   Hemoglobin A1C  09/19/2022   Yearly kidney function blood test for diabetes  01/09/2023   Medicare Annual Wellness Visit  07/05/2023   Pneumonia Vaccine  Completed   Flu Shot  Completed   DEXA scan (bone density measurement)  Completed   Hepatitis C Screening: USPSTF Recommendation to screen - Ages 61-79 yo.  Completed   HPV Vaccine  Aged Out   Colon Cancer Screening  Discontinued   COVID-19 Vaccine  Discontinued    Advanced directives: Please bring a copy of your health care power of attorney and living will to the office to be added to your chart at your convenience.   Conditions/risks identified: none  Next appointment: Follow up in one year for your annual wellness visit 07/10/23 @ 1:00 telephone visit.   Preventive Care 77 Years and Older, Female Preventive care refers to lifestyle choices and visits with your health care provider that can promote health and wellness. What does preventive care  include? A yearly physical exam. This is also called an annual well check. Dental exams once or twice a year. Routine eye exams. Ask your health care provider how often you should have your eyes checked. Personal lifestyle choices, including: Daily care of your teeth and gums. Regular physical activity. Eating a healthy diet. Avoiding tobacco and drug use. Limiting alcohol use. Practicing safe sex. Taking low-dose aspirin every day. Taking vitamin and mineral supplements as recommended by your health care provider. What happens during an annual well check? The services and screenings done by your health care provider during your annual well check will depend on your age, overall health, lifestyle risk factors, and family history of disease. Counseling  Your health care provider may ask you questions about your: Alcohol use. Tobacco use. Drug use. Emotional well-being. Home and relationship well-being. Sexual activity. Eating habits. History of falls. Memory and ability to understand (cognition). Work and work Statistician. Reproductive health. Screening  You may have the following tests or measurements: Height, weight, and BMI. Blood pressure. Lipid and cholesterol levels. These may be checked every 5 years, or more frequently if you are over 51 years old. Skin check. Lung cancer screening. You may have this screening every year starting at age 77 if you have a 30-pack-year  history of smoking and currently smoke or have quit within the past 15 years. Fecal occult blood test (FOBT) of the stool. You may have this test every year starting at age 77. Flexible sigmoidoscopy or colonoscopy. You may have a sigmoidoscopy every 5 years or a colonoscopy every 10 years starting at age 77. Hepatitis C blood test. Hepatitis B blood test. Sexually transmitted disease (STD) testing. Diabetes screening. This is done by checking your blood sugar (glucose) after you have not eaten for a while  (fasting). You may have this done every 1-3 years. Bone density scan. This is done to screen for osteoporosis. You may have this done starting at age 77. Mammogram. This may be done every 1-2 years. Talk to your health care provider about how often you should have regular mammograms. Talk with your health care provider about your test results, treatment options, and if necessary, the need for more tests. Vaccines  Your health care provider may recommend certain vaccines, such as: Influenza vaccine. This is recommended every year. Tetanus, diphtheria, and acellular pertussis (Tdap, Td) vaccine. You may need a Td booster every 10 years. Zoster vaccine. You may need this after age 77. Pneumococcal 13-valent conjugate (PCV13) vaccine. One dose is recommended after age 77. Pneumococcal polysaccharide (PPSV23) vaccine. One dose is recommended after age 77. Talk to your health care provider about which screenings and vaccines you need and how often you need them. This information is not intended to replace advice given to you by your health care provider. Make sure you discuss any questions you have with your health care provider. Document Released: 04/29/2015 Document Revised: 12/21/2015 Document Reviewed: 02/01/2015 Elsevier Interactive Patient Education  2017 [employer] Prevention in the Home Falls can cause injuries. They can happen to people of all ages. There are many things you can do to make your home safe and to help prevent falls. What can I do on the outside of my home? Regularly fix the edges of walkways and driveways and fix any cracks. Remove anything that might make you trip as you walk through a door, such as a raised step or threshold. Trim any bushes or trees on the path to your home. Use bright outdoor lighting. Clear any walking paths of anything that might make someone trip, such as rocks or tools. Regularly check to see if handrails are loose or broken. Make sure that  both sides of any steps have handrails. Any raised decks and porches should have guardrails on the edges. Have any leaves, snow, or ice cleared regularly. Use sand or salt on walking paths during winter. Clean up any spills in your garage right away. This includes oil or grease spills. What can I do in the bathroom? Use night lights. Install grab bars by the toilet and in the tub and shower. Do not use towel bars as grab bars. Use non-skid mats or decals in the tub or shower. If you need to sit down in the shower, use a plastic, non-slip stool. Keep the floor dry. Clean up any water that spills on the floor as soon as it happens. Remove soap buildup in the tub or shower regularly. Attach bath mats securely with double-sided non-slip rug tape. Do not have throw rugs and other things on the floor that can make you trip. What can I do in the bedroom? Use night lights. Make sure that you have a light by your bed that is easy to reach. Do not use any sheets or  blankets that are too big for your bed. They should not hang down onto the floor. Have a firm chair that has side arms. You can use this for support while you get dressed. Do not have throw rugs and other things on the floor that can make you trip. What can I do in the kitchen? Clean up any spills right away. Avoid walking on wet floors. Keep items that you use a lot in easy-to-reach places. If you need to reach something above you, use a strong step stool that has a grab bar. Keep electrical cords out of the way. Do not use floor polish or wax that makes floors slippery. If you must use wax, use non-skid floor wax. Do not have throw rugs and other things on the floor that can make you trip. What can I do with my stairs? Do not leave any items on the stairs. Make sure that there are handrails on both sides of the stairs and use them. Fix handrails that are broken or loose. Make sure that handrails are as long as the stairways. Check  any carpeting to make sure that it is firmly attached to the stairs. Fix any carpet that is loose or worn. Avoid having throw rugs at the top or bottom of the stairs. If you do have throw rugs, attach them to the floor with carpet tape. Make sure that you have a light switch at the top of the stairs and the bottom of the stairs. If you do not have them, ask someone to add them for you. What else can I do to help prevent falls? Wear shoes that: Do not have high heels. Have rubber bottoms. Are comfortable and fit you well. Are closed at the toe. Do not wear sandals. If you use a stepladder: Make sure that it is fully opened. Do not climb a closed stepladder. Make sure that both sides of the stepladder are locked into place. Ask someone to hold it for you, if possible. Clearly mark and make sure that you can see: Any grab bars or handrails. First and last steps. Where the edge of each step is. Use tools that help you move around (mobility aids) if they are needed. These include: Canes. Walkers. Scooters. Crutches. Turn on the lights when you go into a dark area. Replace any light bulbs as soon as they burn out. Set up your furniture so you have a clear path. Avoid moving your furniture around. If any of your floors are uneven, fix them. If there are any pets around you, be aware of where they are. Review your medicines with your doctor. Some medicines can make you feel dizzy. This can increase your chance of falling. Ask your doctor what other things that you can do to help prevent falls. This information is not intended to replace advice given to you by your health care provider. Make sure you discuss any questions you have with your health care provider. Document Released: 01/27/2009 Document Revised: 09/08/2015 Document Reviewed: 05/07/2014 Elsevier Interactive Patient Education  2017 [employer].

## 2022-07-05 NOTE — Progress Notes (Signed)
I connected with  [name] on 07/05/22 by a audio enabled telemedicine application and verified that I am speaking with the correct person using two identifiers.  Patient Location: Home  Provider Location: Home Office  I discussed the limitations of evaluation and management by telemedicine. The patient expressed understanding and agreed to proceed.  Subjective:   IVA BJORKMAN is a 77 y.o. female who presents for Medicare Annual (Subsequent) preventive examination.  Review of Systems      Cardiac Risk Factors include: advanced age (>56men, >3 women);diabetes mellitus;hypertension     Objective:    Today's Vitals   07/05/22 1330  Weight: 198 lb (89.8 kg)  Height: 5\' 5"  (1.651 m)   Body mass index is 32.95 kg/m.     07/05/2022    1:37 PM 06/28/2021    2:58 PM 09/16/2019    2:36 PM 06/05/2018   10:59 AM 05/31/2017    8:35 AM 08/04/2012   12:31 PM 08/04/2012    6:44 AM  Advanced Directives  Does Patient Have a Medical Advance Directive? Yes Yes Yes Yes Yes Patient has advance directive, copy not in chart Patient has advance directive, copy not in chart  Type of Advance Directive [place];Living will Living will;Healthcare Power of [place];Living will [place];Living will [place];Living will Living will Living will  Does patient want to make changes to medical advance directive?  Yes (MAU/Ambulatory/Procedural Areas - Information given)       Copy of [place] in Chart? No - copy requested  No - copy requested No - copy requested No - copy requested Copy requested from family Copy requested from family  Pre-existing out of facility DNR order (yellow form or pink MOST form)      No No    Current Medications (verified) Outpatient Encounter Medications as of 07/05/2022  Medication Sig   amLODipine (NORVASC) 10 MG tablet Take 1 tablet (10 mg total) by mouth daily. for  blood pressure.   atorvastatin (LIPITOR) 20 MG tablet TAKE 1 TABLET BY MOUTH DAILY FOR CHOLESTEROL   blood glucose meter kit and supplies KIT Dispense based on patient and insurance preference. Use up to four times daily as directed. (FOR ICD-9 250.00, 250.01).   Calcium Carb-Cholecalciferol (CALCIUM+D3 PO) Take 1 tablet by mouth daily.   ELIQUIS 5 MG TABS tablet TAKE 1 TABLET BY MOUTH TWICE DAILY   glipiZIDE (GLUCOTROL) 10 MG tablet TAKE 1 TABLET BY MOUTH TWICE DAILY FOR DIABETES   glucose blood (ONETOUCH VERIO) test strip USE FOUR TIMES DAILY AS DIRECTED   hydrochlorothiazide (HYDRODIURIL) 25 MG tablet TAKE 1 TABLET BY MOUTH DAILY FOR HIGH BLOOD PRESSURE   metFORMIN (GLUCOPHAGE-XR) 500 MG 24 hr tablet Take 2 tablets (1,000 mg total) by mouth 2 (two) times daily.   metoprolol succinate (TOPROL-XL) 100 MG 24 hr tablet Take 1 tablet by mouth twice daily with or immediately following a meal   Multiple Vitamin (MULTIVITAMIN WITH MINERALS) TABS tablet Take 1 tablet by mouth daily.   potassium chloride (KLOR-CON) 10 MEQ tablet TAKE 1 TABLET BY MOUTH DAILY   Propylene Glycol 0.6 % SOLN Place 1 drop into both eyes daily as needed (for dry eyes).   albuterol (VENTOLIN HFA) 108 (90 Base) MCG/ACT inhaler Inhale into the lungs as directed. (Patient not taking: Reported on 03/20/2022)   No facility-administered encounter medications on file as of 07/05/2022.    Allergies (verified) Lisinopril, Morphine and  related, and Oxycodone   History: Past Medical History:  Diagnosis Date   Angioedema    Arthritis    Colon cancer ([place])    colon ca dx 07, colon resection   Complication of anesthesia    Diabetes mellitus    Diverticulitis    Dysrhythmia    palpitations   Helicobacter pylori (H. pylori)    Hypertension    Malignant neoplasm of colon    Neuropathy, idiopathic    PONV (postoperative nausea and vomiting)    Postoperative anemia due to acute blood loss 08/05/2012   Type II diabetes mellitus  without mention of complication    Past Surgical History:  Procedure Laterality Date   COLON RESECTION  2007   colon cancer   STERIOD INJECTION Left 08/04/2012   Procedure: STEROID INJECTION;  Surgeon: Gearlean Alf, MD;  Location: [facility];  Service: Orthopedics;  Laterality: Left;   TONSILLECTOMY     TOTAL KNEE ARTHROPLASTY Right 08/04/2012   Procedure: TOTAL KNEE ARTHROPLASTY;  Surgeon: Gearlean Alf, MD;  Location: [facility];  Service: Orthopedics;  Laterality: Right;   TUBAL LIGATION     Family History  Problem Relation Age of Onset   Lung cancer Mother 30   Kidney failure Father 16   Diabetes Sister    Diabetes Brother    Brain cancer Daughter    Social History   Socioeconomic History   Marital status: Married    Spouse name: Not on file   Number of children: Not on file   Years of education: Not on file   Highest education level: Not on file  Occupational History   Not on file  Tobacco Use   Smoking status: Former    Types: Cigarettes    Quit date: 06/18/1972    Years since quitting: 50.0   Smokeless tobacco: Never  Vaping Use   Vaping Use: Never used  Substance and Sexual Activity   Alcohol use: No   Drug use: No   Sexual activity: Not Currently  Other Topics Concern   Not on file  Social History Narrative   Married.   4 children, 7 grandchildren, 3 great grandchildren.   Retired. Once worked at [employer]	[place].   Enjoys walking, puzzle books, spending time with family.   Social Determinants of Health   Financial Resource Strain: Low Risk  (07/05/2022)   Overall Financial Resource Strain (CARDIA)    Difficulty of Paying Living Expenses: Not hard at all  Food Insecurity: No Food Insecurity (07/05/2022)   Hunger Vital Sign    Worried About Running Out of Food in the Last Year: Never true    Ran Out of Food in the Last Year: Never true  Transportation Needs: No Transportation Needs (07/05/2022)   PRAPARE - Hydrologist  (Medical): No    Lack of Transportation (Non-Medical): No  Physical Activity: Sufficiently Active (07/05/2022)   Exercise Vital Sign    Days of Exercise per Week: 7 days    Minutes of Exercise per Session: 40 min  Stress: No Stress Concern Present (07/05/2022)   [place]    Feeling of Stress : Not at all  Social Connections: Moderately Integrated (07/05/2022)   Social Connection and Isolation Panel [NHANES]    Frequency of Communication with Friends and Family: More than three times a week    Frequency of Social Gatherings with Friends and Family: More than three times a week  Attends Religious Services: More than 4 times per year    Active Member of Clubs or Organizations: No    Attends Archivist Meetings: Never    Marital Status: Married    Tobacco Counseling Counseling given: Not Answered   Clinical Intake:  Pre-visit preparation completed: Yes  Pain : No/denies pain     Nutritional Risks: None Diabetes: Yes CBG done?: No Did pt. bring in CBG monitor from home?: No  How often do you need to have someone help you when you read instructions, pamphlets, or other written materials from your doctor or pharmacy?: 1 - Never  Diabetic?Nutrition Risk Assessment:  Has the patient had any N/V/D within the last 2 months?  No  Does the patient have any non-healing wounds?  No  Has the patient had any unintentional weight loss or weight gain?  No   Diabetes:  Is the patient diabetic?  Yes  If diabetic, was a CBG obtained today?  No  Did the patient bring in their glucometer from home?  No  How often do you monitor your CBG's? QD.   Financial Strains and Diabetes Management:  Are you having any financial strains with the device, your supplies or your medication? No .  Does the patient want to be seen by Chronic Care Management for management of their diabetes?  No  Would the patient like to be  referred to a Nutritionist or for Diabetic Management?  No   Diabetic Exams:  Diabetic Eye Exam: Completed 04/27/20 Dr.Groat  Diabetic Foot Exam: Completed 09/07/21 PCP    Interpreter Needed?: No  Information entered by :: C/Nathen Balaban LPN   Activities of Daily Living    07/05/2022    1:38 PM  In your present state of health, do you have any difficulty performing the following activities:  Hearing? 0  Vision? 0  Difficulty concentrating or making decisions? 1  Comment occasionally forgets  Walking or climbing stairs? 0  Dressing or bathing? 0  Doing errands, shopping? 0  Preparing Food and eating ? N  Using the Toilet? N  In the past six months, have you accidently leaked urine? Y  Comment occasionally when coughs or sneezes  Do you have problems with loss of bowel control? N  Managing your Medications? N  Managing your Finances? N  Housekeeping or managing your Housekeeping? N    Patient Care Team: Pleas Koch, NP as PCP - General (Internal Medicine) Nahser, Wonda Cheng, MD as PCP - Cardiology (Cardiology) Shirley Muscat Loreen Freud, MD as Referring Physician (Optometry)  Indicate any recent Medical Services you may have received from other than [facility] providers in the past year (date may be approximate).     Assessment:   This is a routine wellness examination for Talaiya.  Hearing/Vision screen Hearing Screening - Comments:: No aids Vision Screening - Comments:: Readers - Dr.Groat  Dietary issues and exercise activities discussed: Current Exercise Habits: Home exercise routine, Type of exercise: walking, Time (Minutes): 40, Frequency (Times/Week): 7, Weekly Exercise (Minutes/Week): 280, Intensity: Mild, Exercise limited by: None identified   Goals Addressed             This Visit's Progress    Patient Stated       No new goals       Depression Screen    07/05/2022    1:35 PM 09/07/2021    9:53 AM 06/28/2021    3:02 PM 04/25/2021    9:04 AM 09/16/2019    2:37  PM  06/05/2018   10:52 AM 05/31/2017    8:14 AM  PHQ 2/9 Scores  PHQ - 2 Score 0 0 0 0 0 0 0  PHQ- 9 Score  0  0 0 0 0    Fall Risk    07/05/2022    1:38 PM 06/28/2021    2:59 PM 04/25/2021    9:05 AM 02/23/2020    8:27 AM 09/16/2019    2:37 PM  [place] in the past year? 0 1 1 0 0  Number falls in past yr: 0 0 0 0 0  Injury with Fall? 0 1 1 0 0  Comment  bruised right knee     Risk for fall due to : No Fall Risks Other (Comment) History of fall(s);Impaired balance/gait  Medication side effect  Risk for fall due to: Comment  slipped on water     Follow up Falls prevention discussed;Falls evaluation completed Falls prevention discussed   Falls evaluation completed;Falls prevention discussed    FALL RISK PREVENTION PERTAINING TO THE HOME:  Any stairs in or around the home? Yes  If so, are there any without handrails? No  Home free of loose throw rugs in walkways, pet beds, electrical cords, etc? Yes  Adequate lighting in your home to reduce risk of falls? Yes   ASSISTIVE DEVICES UTILIZED TO PREVENT FALLS:  Life alert? No  Use of a cane, walker or w/c? Yes  Grab bars in the bathroom? No  Shower chair or bench in shower? Yes  Elevated toilet seat or a handicapped toilet? Yes    Cognitive Function:    09/16/2019    2:41 PM 06/05/2018   10:52 AM 05/31/2017    8:14 AM  MMSE - Mini Mental State Exam  Orientation to time 5 5 5   Orientation to Place 5 5 5   Registration 3 3 3   Attention/ Calculation 5 0 0  Recall 3 3 3   Language- name 2 objects  0 0  Language- repeat 1 1 1   Language- follow 3 step command  0 1  Language- follow 3 step command-comments  Refused to complete clock drawing unable to follow 1 step of 3 step command; 3 attempts to complete clock  Language- read & follow direction  0 0  Write a sentence  0 0  Copy design  0 0  Total score  17 18        07/05/2022    1:39 PM  6CIT Screen  What Year? 0 points  What month? 0 points  What time? 0 points   Count back from 20 0 points  Months in reverse 0 points  Repeat phrase 4 points  Total Score 4 points    Immunizations Immunization History  Administered Date(s) Administered   Fluad Quad(high Dose 65+) 02/23/2020, 04/25/2021, 03/20/2022   Influenza Split 03/29/2009   Influenza,inj,Quad PF,6+ Mos 06/05/2018   Influenza,trivalent, recombinat, inj, PF 03/29/2009   Pneumococcal Conjugate-13 11/30/2014   Pneumococcal Polysaccharide-23 05/08/2016   Td 04/16/2006    TDAP status: Due, Education has been provided regarding the importance of this vaccine. Advised may receive this vaccine at local pharmacy or Health Dept. Aware to provide a copy of the vaccination record if obtained from local pharmacy or Health Dept. Verbalized acceptance and understanding.  Flu Vaccine status: Up to date  Pneumococcal vaccine status: Up to date  Covid-19 vaccine status: Declined, Education has been provided regarding the importance of this vaccine but patient still declined. Advised may  receive this vaccine at local pharmacy or Health Dept.or vaccine clinic. Aware to provide a copy of the vaccination record if obtained from local pharmacy or Health Dept. Verbalized acceptance and understanding.  Qualifies for Shingles Vaccine? No   Zostavax completed No   Shingrix Completed?: No.    Education has been provided regarding the importance of this vaccine. Patient has been advised to call insurance company to determine out of pocket expense if they have not yet received this vaccine. Advised may also receive vaccine at local pharmacy or Health Dept. Verbalized acceptance and understanding.  Screening Tests Health Maintenance  Topic Date Due   Zoster Vaccines- Shingrix (1 of 2) Never done   DTaP/Tdap/Td (2 - Tdap) 04/16/2016   OPHTHALMOLOGY EXAM  04/27/2021   MAMMOGRAM  08/22/2021   Diabetic kidney evaluation - Urine ACR  04/25/2022   FOOT EXAM  09/08/2022   HEMOGLOBIN A1C  09/19/2022   Diabetic kidney  evaluation - eGFR measurement  01/09/2023   Medicare Annual Wellness (AWV)  07/05/2023   Pneumonia Vaccine 11+ Years old  Completed   INFLUENZA VACCINE  Completed   DEXA SCAN  Completed   Hepatitis C Screening  Completed   HPV VACCINES  Aged Out   COLONOSCOPY (Pts 45-23yrs Insurance coverage will need to be confirmed)  Discontinued   COVID-19 Vaccine  Discontinued    Health Maintenance  Health Maintenance Due  Topic Date Due   Zoster Vaccines- Shingrix (1 of 2) Never done   DTaP/Tdap/Td (2 - Tdap) 04/16/2016   OPHTHALMOLOGY EXAM  04/27/2021   MAMMOGRAM  08/22/2021   Diabetic kidney evaluation - Urine ACR  04/25/2022    Colorectal cancer screening: No longer required.   Mammogram status: Ordered 07/05/22. Pt provided with contact info and advised to call to schedule appt.   Bone Scan - ordered 07/05/22   Lung Cancer Screening: (Low Dose CT Chest recommended if Age 87-80 years, 30 pack-year currently smoking OR have quit w/in 15years.) does not qualify.   Lung Cancer Screening Referral: no  Additional Screening:  Hepatitis C Screening: does not qualify; Completed 05/31/17  Vision Screening: Recommended annual ophthalmology exams for early detection of glaucoma and other disorders of the eye. Is the patient up to date with their annual eye exam?  No  Who is the provider or what is the name of the office in which the patient attends annual eye exams? Dr.Groat has appt. Scheduled. If pt is not established with a provider, would they like to be referred to a provider to establish care? No .   Dental Screening: Recommended annual dental exams for proper oral hygiene  Community Resource Referral / Chronic Care Management: CRR required this visit?  No   CCM required this visit?  No      Plan:     I have personally reviewed and noted the following in the patient's chart:   Medical and social history Use of alcohol, tobacco or illicit drugs  Current medications and  supplements including opioid prescriptions. Patient is not currently taking opioid prescriptions. Functional ability and status Nutritional status Physical activity Advanced directives List of other physicians Hospitalizations, surgeries, and ER visits in previous 12 months Vitals Screenings to include cognitive, depression, and falls Referrals and appointments  In addition, I have reviewed and discussed with patient certain preventive protocols, quality metrics, and best practice recommendations. A written personalized care plan for preventive services as well as general preventive health recommendations were provided to patient.  Lebron Conners, LPN   [id number]   Nurse Notes: order for mammogram and dexa scan ordered at patients request.

## 2022-07-06 ENCOUNTER — Ambulatory Visit: Payer: Medicare Other | Attending: Cardiovascular Disease | Admitting: Cardiovascular Disease

## 2022-07-06 ENCOUNTER — Encounter: Payer: Self-pay | Admitting: Cardiovascular Disease

## 2022-07-06 VITALS — BP 142/65 | Ht 65.0 in | Wt 186.0 lb

## 2022-07-06 DIAGNOSIS — I10 Essential (primary) hypertension: Secondary | ICD-10-CM

## 2022-07-06 NOTE — Patient Instructions (Signed)
Medication Instructions:   Your physician recommends that you continue on your current medications as directed. Please refer to the Current Medication list given to you today.  *If you need a refill on your cardiac medications before your next appointment, please call your pharmacy*    Follow-Up: At [facility], you and your health needs are our priority.  As part of our continuing mission to provide you with exceptional heart care, we have created designated Provider Care Teams.  These Care Teams include your primary Cardiologist (physician) and Advanced Practice Providers (APPs -  Physician Assistants and Nurse Practitioners) who all work together to provide you with the care you need, when you need it.  We recommend signing up for the patient portal called "MyChart".  Sign up information is provided on this After Visit Summary.  MyChart is used to connect with patients for Virtual Visits (Telemedicine).  Patients are able to view lab/test results, encounter notes, upcoming appointments, etc.  Non-urgent messages can be sent to your provider as well.   To learn more about what you can do with MyChart, go to [email or web address].    Your next appointment:   1 year(s)  Provider:   Philip Nahser, MD       

## 2022-07-09 ENCOUNTER — Other Ambulatory Visit: Payer: Self-pay | Admitting: Primary Care

## 2022-07-09 DIAGNOSIS — E785 Hyperlipidemia, unspecified: Secondary | ICD-10-CM

## 2022-07-10 ENCOUNTER — Other Ambulatory Visit: Payer: Self-pay | Admitting: Primary Care

## 2022-07-10 DIAGNOSIS — E1140 Type 2 diabetes mellitus with diabetic neuropathy, unspecified: Secondary | ICD-10-CM

## 2022-07-10 DIAGNOSIS — I10 Essential (primary) hypertension: Secondary | ICD-10-CM

## 2022-07-20 ENCOUNTER — Encounter: Payer: Medicare Other | Admitting: Primary Care

## 2022-08-08 ENCOUNTER — Encounter: Payer: Medicare Other | Admitting: Primary Care

## 2022-08-20 ENCOUNTER — Other Ambulatory Visit: Payer: Self-pay | Admitting: Physician Assistant

## 2022-08-20 ENCOUNTER — Other Ambulatory Visit: Payer: Self-pay | Admitting: Primary Care

## 2022-08-20 DIAGNOSIS — I480 Paroxysmal atrial fibrillation: Secondary | ICD-10-CM

## 2022-08-20 DIAGNOSIS — I10 Essential (primary) hypertension: Secondary | ICD-10-CM

## 2022-08-20 NOTE — Telephone Encounter (Signed)
Pt last saw Dr Elease Hashimoto 07/06/22, last labs 01/08/22 Creat 0.89, age 77, weight 84.4kg, based on specified criteria pt is on appropriate dosage of Eliquis 5mg  BID for afib.  Will refill rx.

## 2022-08-23 ENCOUNTER — Encounter: Payer: Medicare Other | Admitting: Primary Care

## 2022-08-28 ENCOUNTER — Encounter: Payer: Self-pay | Admitting: Primary Care

## 2022-08-28 ENCOUNTER — Ambulatory Visit (INDEPENDENT_AMBULATORY_CARE_PROVIDER_SITE_OTHER): Payer: Medicare Other | Admitting: Primary Care

## 2022-08-28 VITALS — BP 160/64 | HR 70 | Temp 97.5°F | Ht 65.0 in | Wt 192.0 lb

## 2022-08-28 DIAGNOSIS — Z0000 Encounter for general adult medical examination without abnormal findings: Secondary | ICD-10-CM

## 2022-08-28 DIAGNOSIS — I10 Essential (primary) hypertension: Secondary | ICD-10-CM

## 2022-08-28 DIAGNOSIS — I480 Paroxysmal atrial fibrillation: Secondary | ICD-10-CM | POA: Diagnosis not present

## 2022-08-28 DIAGNOSIS — Z85038 Personal history of other malignant neoplasm of large intestine: Secondary | ICD-10-CM | POA: Diagnosis not present

## 2022-08-28 DIAGNOSIS — Z7984 Long term (current) use of oral hypoglycemic drugs: Secondary | ICD-10-CM | POA: Diagnosis not present

## 2022-08-28 DIAGNOSIS — E7800 Pure hypercholesterolemia, unspecified: Secondary | ICD-10-CM

## 2022-08-28 DIAGNOSIS — E1140 Type 2 diabetes mellitus with diabetic neuropathy, unspecified: Secondary | ICD-10-CM | POA: Diagnosis not present

## 2022-08-28 LAB — COMPREHENSIVE METABOLIC PANEL
ALT: 14 U/L (ref 0–35)
AST: 15 U/L (ref 0–37)
Albumin: 4.7 g/dL (ref 3.5–5.2)
Alkaline Phosphatase: 75 U/L (ref 39–117)
BUN: 25 mg/dL — ABNORMAL HIGH (ref 6–23)
CO2: 27 mEq/L (ref 19–32)
Calcium: 10.1 mg/dL (ref 8.4–10.5)
Chloride: 97 mEq/L (ref 96–112)
Creatinine, Ser: 0.88 mg/dL (ref 0.40–1.20)
GFR: 63.7 mL/min (ref >60.00)
Glucose, Bld: 244 mg/dL — ABNORMAL HIGH (ref 70–99)
Potassium: 3.6 mEq/L (ref 3.5–5.1)
Sodium: 136 mEq/L (ref 135–145)
Total Bilirubin: 0.6 mg/dL (ref 0.2–1.2)
Total Protein: 8.5 g/dL — ABNORMAL HIGH (ref 6.0–8.3)

## 2022-08-28 LAB — MICROALBUMIN / CREATININE URINE RATIO
Creatinine,U: 16.9 mg/dL
Microalb Creat Ratio: 11.5 mg/g (ref 0.0–30.0)
Microalb, Ur: 1.9 mg/dL (ref 0.0–1.9)

## 2022-08-28 LAB — LIPID PANEL
Cholesterol: 129 mg/dL (ref 0–200)
HDL: 63.7 mg/dL (ref >39.00)
LDL Cholesterol: 54 mg/dL (ref 0–99)
NonHDL: 65.73
Total CHOL/HDL Ratio: 2
Triglycerides: 57 mg/dL (ref 0.0–149.0)
VLDL: 11.4 mg/dL (ref 0.0–40.0)

## 2022-08-28 LAB — HEMOGLOBIN A1C: Hgb A1c MFr Bld: 10.3 % — ABNORMAL HIGH (ref 4.6–6.5)

## 2022-08-28 NOTE — Patient Instructions (Signed)
Stop by the lab prior to leaving today. I will notify you of your results once received.   Start monitoring your blood pressure daily, around the same time of day, for the next 2-3 weeks.  Ensure that you have rested for 30 minutes prior to checking your blood pressure.   Record your readings and bring them to me in 2 weeks.   Please schedule a follow up visit for 6 months for a diabetes check.  It was a pleasure to see you today!

## 2022-08-28 NOTE — Assessment & Plan Note (Signed)
Above goal today, also on recheck and during numerous prior office visits.  Recommended we add Valsartan 80 mg daily, she declines. She will start checking BP at home and bring [place] readings in 2 weeks.  Continue amlodipine 10 mg daily, metoprolol succinate 100 mg daily, HCTZ 25 mg daily.  Also encouraged weight loss.

## 2022-08-28 NOTE — Assessment & Plan Note (Signed)
Repeat lipid panel pending. Continue atorvastatin 20 mg daily. 

## 2022-08-28 NOTE — Progress Notes (Signed)
Subjective:    Patient ID: Carly Buckley, female    DOB: Mar 23, 1946, 77 y.o.   MRN: [id number]  HPI  Carly Buckley is a very pleasant 77 y.o. female who presents today for complete physical and follow up of chronic conditions.  Immunizations: -Tetanus: Completed in 2008 -Shingles:  -Pneumonia: Completed Prevnar 13 in 2016 and pneumovax 23 in 2018  Diet: Fair diet. She has been cutting back on sugar foods and frozen meals. She has increased consumption of veggies.  Exercise: She is walking daily at the church.   Eye exam: Completes annually  Dental exam: Completes semi-annually    Mammogram: 2023, through [facility] Bone Density Scan: 2023 through [facility]  Colonoscopy: Completed in 2020, due 2025  BP Readings from Last 3 Encounters:  08/28/22 (!) 160/64  07/06/22 (!) 142/65  03/20/22 (!) 160/58         Review of Systems  Constitutional:  Negative for unexpected weight change.  HENT:  Negative for rhinorrhea.   Eyes:  Negative for visual disturbance.  Respiratory:  Negative for cough and shortness of breath.   Cardiovascular:  Negative for chest pain.  Gastrointestinal:  Negative for constipation and diarrhea.  Genitourinary:  Negative for difficulty urinating.  Musculoskeletal:  Negative for arthralgias and myalgias.  Skin:  Negative for rash.  Allergic/Immunologic: Negative for environmental allergies.  Neurological:  Positive for headaches. Negative for dizziness.  Psychiatric/Behavioral:  The patient is not nervous/anxious.          Past Medical History:  Diagnosis Date   Angioedema    Arthritis    Colon cancer (HCC)    colon ca dx 07, colon resection   Complication of anesthesia    Diabetes mellitus    Diverticulitis    Dysrhythmia    palpitations   Helicobacter pylori (H. pylori)    Hypertension    Malignant neoplasm of colon    Neuropathy, idiopathic    PONV (postoperative nausea and vomiting)    Postoperative anemia due to acute blood loss 08/05/2012    Tachycardia 08/19/2012   Type II diabetes mellitus without mention of complication     Social History   Socioeconomic History   Marital status: Married    Spouse name: Not on file   Number of children: Not on file   Years of education: Not on file   Highest education level: Not on file  Occupational History   Not on file  Tobacco Use   Smoking status: Former    Types: Cigarettes    Quit date: 06/18/1972    Years since quitting: 50.2   Smokeless tobacco: Never  Vaping Use   Vaping Use: Never used  Substance and Sexual Activity   Alcohol use: No   Drug use: No   Sexual activity: Not Currently  Other Topics Concern   Not on file  Social History Narrative   Married.   4 children, 7 grandchildren, 3 great grandchildren.   Retired. Once worked at [employer].   Enjoys walking, puzzle books, spending time with family.   Social Determinants of Health   Financial Resource Strain: Low Risk  (07/05/2022)   Overall Financial Resource Strain (CARDIA)    Difficulty of Paying Living Expenses: Not hard at all  Food Insecurity: No Food Insecurity (07/05/2022)   Hunger Vital Sign    Worried About Running Out of Food in the Last Year: Never true    Ran Out of Food in the Last Year: Never true  Transportation  Needs: No Transportation Needs (07/05/2022)   PRAPARE - Administrator, Civil Service (Medical): No    Lack of Transportation (Non-Medical): No  Physical Activity: Sufficiently Active (07/05/2022)   Exercise Vital Sign    Days of Exercise per Week: 7 days    Minutes of Exercise per Session: 40 min  Stress: No Stress Concern Present (07/05/2022)   [employer] of Occupational Health - Occupational Stress Questionnaire    Feeling of Stress : Not at all  Social Connections: Moderately Integrated (07/05/2022)   Social Connection and Isolation Panel [NHANES]    Frequency of Communication with Friends and Family: More than three times a week    Frequency of  Social Gatherings with Friends and Family: More than three times a week    Attends Religious Services: More than 4 times per year    Active Member of [employer] or Organizations: No    Attends Banker Meetings: Never    Marital Status: Married  Catering manager Violence: Not At Risk (07/05/2022)   Humiliation, Afraid, Rape, and Kick questionnaire    Fear of Current or Ex-Partner: No    Emotionally Abused: No    Physically Abused: No    Sexually Abused: No    Past Surgical History:  Procedure Laterality Date   COLON RESECTION  2007   colon cancer   STERIOD INJECTION Left 08/04/2012   Procedure: STEROID INJECTION;  Surgeon: Loanne Drilling, MD;  Location: [facility];  Service: Orthopedics;  Laterality: Left;   TONSILLECTOMY     TOTAL KNEE ARTHROPLASTY Right 08/04/2012   Procedure: TOTAL KNEE ARTHROPLASTY;  Surgeon: Loanne Drilling, MD;  Location: [facility];  Service: Orthopedics;  Laterality: Right;   TUBAL LIGATION      Family History  Problem Relation Age of Onset   Lung cancer Mother 73   Kidney failure Father 46   Diabetes Sister    Diabetes Brother    Brain cancer Daughter     Allergies  Allergen Reactions   Lisinopril Swelling   Morphine And Related     Nauseated    Oxycodone Nausea Only    Current Outpatient Medications on File Prior to Visit  Medication Sig Dispense Refill   amLODipine (NORVASC) 10 MG tablet Take 1 tablet (10 mg total) by mouth daily. for blood pressure. 90 tablet 0   atorvastatin (LIPITOR) 20 MG tablet TAKE 1 TABLET BY MOUTH DAILY FOR CHOLESTEROL 90 tablet 0   blood glucose meter kit and supplies KIT Dispense based on patient and insurance preference. Use up to four times daily as directed. (FOR ICD-9 250.00, 250.01). 1 each 0   Calcium Carb-Cholecalciferol (CALCIUM+D3 PO) Take 1 tablet by mouth daily.     ELIQUIS 5 MG TABS tablet TAKE 1 TABLET BY MOUTH TWICE DAILY 60 tablet 5   glipiZIDE (GLUCOTROL) 10 MG tablet TAKE 1 TABLET BY MOUTH TWICE  DAILY FOR DIABETES 180 tablet 1   glucose blood (ONETOUCH VERIO) test strip USE FOUR TIMES DAILY AS DIRECTED 300 strip 1   hydrochlorothiazide (HYDRODIURIL) 25 MG tablet TAKE 1 TABLET BY MOUTH DAILY FOR HIGH BLOOD PRESSURE 90 tablet 0   metFORMIN (GLUCOPHAGE-XR) 500 MG 24 hr tablet Take 2 tablets (1,000 mg total) by mouth 2 (two) times daily. 360 tablet 0   metoprolol succinate (TOPROL-XL) 100 MG 24 hr tablet Take 1 tablet by mouth twice daily with or immediately following a meal 180 tablet 3   Multiple Vitamin (MULTIVITAMIN WITH MINERALS) TABS tablet  Take 1 tablet by mouth daily.     potassium chloride (KLOR-CON) 10 MEQ tablet TAKE 1 TABLET BY MOUTH DAILY 90 tablet 3   Propylene Glycol 0.6 % SOLN Place 1 drop into both eyes daily as needed (for dry eyes).     No current facility-administered medications on file prior to visit.    BP (!) 160/64   Pulse 70   Temp (!) 97.5 F (36.4 C) (Temporal)   Ht 5\' 5"  (1.651 m)   Wt 192 lb (87.1 kg)   SpO2 98%   BMI 31.95 kg/m  Objective:   Physical Exam HENT:     Right Ear: Tympanic membrane and ear canal normal.     Left Ear: Tympanic membrane and ear canal normal.     Nose: Nose normal.  Eyes:     Conjunctiva/sclera: Conjunctivae normal.     Pupils: Pupils are equal, round, and reactive to light.  Neck:     Thyroid: No thyromegaly.  Cardiovascular:     Rate and Rhythm: Normal rate and regular rhythm.     Heart sounds: No murmur heard. Pulmonary:     Effort: Pulmonary effort is normal.     Breath sounds: Normal breath sounds. No rales.  Abdominal:     General: Bowel sounds are normal.     Palpations: Abdomen is soft.     Tenderness: There is no abdominal tenderness.  Musculoskeletal:        General: Normal range of motion.     Cervical back: Neck supple.  Lymphadenopathy:     Cervical: No cervical adenopathy.  Skin:    General: Skin is warm and dry.     Findings: No rash.  Neurological:     Mental Status: She is alert and  oriented to person, place, and time.     Cranial Nerves: No cranial nerve deficit.     Deep Tendon Reflexes: Reflexes are normal and symmetric.  Psychiatric:        Mood and Affect: Mood normal.           Assessment & Plan:  Preventative health care Assessment & Plan: Declines Shingrix vaccines. Mammogram due in May 2024, she is aware and will schedule through [facility] Bone density scan UTD. Colonoscopy UTD, due 2025  Discussed the importance of a healthy diet and regular exercise in order for weight loss, and to reduce the risk of further co-morbidity.  Exam stable. Labs pending.  Follow up in 1 year for repeat physical.    Paroxysmal atrial fibrillation (HCC) Assessment & Plan: Rate and rhythm regular.   Continue Eliquis 5 mg BID, metoprolol 100 mg daily   Type 2 diabetes mellitus with diabetic neuropathy, without long-term current use of insulin (HCC) Assessment & Plan: Repeat A1C pending.  Continue Glipizide 10 mg BID, metformin XR 1000 mg BID.  Urine microalbumin due and pending.  Follow up in 3-6 months based on A1C result.   Orders: -     Microalbumin / creatinine urine ratio -     Hemoglobin A1c  History of colon cancer Assessment & Plan: Colonoscopy UTD, due 2025.   Pure hypercholesterolemia Assessment & Plan: Repeat lipid panel pending.  Continue atorvastatin 20 mg daily.   Orders: -     Lipid panel -     Comprehensive metabolic panel  Essential hypertension Assessment & Plan: Above goal today, also on recheck and during numerous prior office visits.  Recommended we add Valsartan 80 mg daily, she declines. She will start  checking BP at home and bring [place] readings in 2 weeks.  Continue amlodipine 10 mg daily, metoprolol succinate 100 mg daily, HCTZ 25 mg daily.  Also encouraged weight loss.         Doreene Nest, NP

## 2022-08-28 NOTE — Assessment & Plan Note (Signed)
Colonoscopy UTD, due 2025 

## 2022-08-28 NOTE — Assessment & Plan Note (Signed)
Rate and rhythm regular.   Continue Eliquis 5 mg BID, metoprolol 100 mg daily

## 2022-08-28 NOTE — Assessment & Plan Note (Signed)
Repeat A1C pending.  Continue Glipizide 10 mg BID, metformin XR 1000 mg BID.  Urine microalbumin due and pending.  Follow up in 3-6 months based on A1C result.

## 2022-08-28 NOTE — Assessment & Plan Note (Signed)
Declines Shingrix vaccines. Mammogram due in May 2024, she is aware and will schedule through [facility] Bone density scan UTD. Colonoscopy UTD, due 2025  Discussed the importance of a healthy diet and regular exercise in order for weight loss, and to reduce the risk of further co-morbidity.  Exam stable. Labs pending.  Follow up in 1 year for repeat physical.

## 2022-08-29 ENCOUNTER — Other Ambulatory Visit: Payer: Self-pay | Admitting: Primary Care

## 2022-08-29 DIAGNOSIS — E1140 Type 2 diabetes mellitus with diabetic neuropathy, unspecified: Secondary | ICD-10-CM

## 2022-08-29 MED ORDER — TRULICITY 0.75 MG/0.5ML ~~LOC~~ SOAJ
0.7500 mg | SUBCUTANEOUS | 0 refills | Status: DC
Start: 2022-08-29 — End: 2023-03-28

## 2022-09-13 ENCOUNTER — Other Ambulatory Visit: Payer: Self-pay | Admitting: Primary Care

## 2022-09-13 DIAGNOSIS — E1140 Type 2 diabetes mellitus with diabetic neuropathy, unspecified: Secondary | ICD-10-CM

## 2022-10-10 ENCOUNTER — Other Ambulatory Visit: Payer: Self-pay | Admitting: Primary Care

## 2022-10-10 DIAGNOSIS — E785 Hyperlipidemia, unspecified: Secondary | ICD-10-CM

## 2022-10-10 DIAGNOSIS — I10 Essential (primary) hypertension: Secondary | ICD-10-CM

## 2022-10-11 ENCOUNTER — Telehealth: Payer: Self-pay | Admitting: Primary Care

## 2022-10-11 NOTE — Telephone Encounter (Signed)
Called and advised patient of Carly Buckley message. Answered all questions. She stated she will begin doing this once weekly injectable, Trulicity.  Changed 3 mo f/u appt to Sept.

## 2022-10-11 NOTE — Telephone Encounter (Signed)
Please notify patient that the medication that she has in her refrigerator is called Trulicity.  This is not insulin.  This is injected into the skin once weekly for diabetes. Be sure that she understands that this is not a daily injectable medicine.    Yes, she can start the Trulicity medication now.  I recommend we bump her appointment back 1 month if she is just now starting the medication.  Currently scheduled for mid August.

## 2022-10-11 NOTE — Telephone Encounter (Signed)
Pt called requesting to speak to [facility] regarding her insulin meds. Pt states she was given the meds back in May 2024, but never took it. Pt stated the meds have been in her refrigerator. Pt asked if it would be fine for her to start the meds now? Call back # [phone]

## 2022-10-11 NOTE — Telephone Encounter (Signed)
Unable to reach patient. Left voicemail to return call to our office.   

## 2022-10-11 NOTE — Telephone Encounter (Signed)
Will call patient and see meds are in date. If so ok to start taking?

## 2022-10-30 ENCOUNTER — Other Ambulatory Visit: Payer: Self-pay | Admitting: Primary Care

## 2022-10-30 DIAGNOSIS — E1140 Type 2 diabetes mellitus with diabetic neuropathy, unspecified: Secondary | ICD-10-CM

## 2022-11-01 ENCOUNTER — Other Ambulatory Visit: Payer: Self-pay | Admitting: Cardiovascular Disease

## 2022-11-01 DIAGNOSIS — I10 Essential (primary) hypertension: Secondary | ICD-10-CM

## 2022-11-01 DIAGNOSIS — Z1231 Encounter for screening mammogram for malignant neoplasm of breast: Secondary | ICD-10-CM | POA: Diagnosis not present

## 2022-11-01 LAB — HM MAMMOGRAPHY

## 2022-11-08 ENCOUNTER — Other Ambulatory Visit: Payer: Self-pay | Admitting: Primary Care

## 2022-11-08 DIAGNOSIS — I10 Essential (primary) hypertension: Secondary | ICD-10-CM

## 2022-11-09 ENCOUNTER — Telehealth: Payer: Self-pay | Admitting: Primary Care

## 2022-11-09 NOTE — Telephone Encounter (Signed)
Carly Buckley, is there any way around this? Coupon card or patient assistance?

## 2022-11-09 NOTE — Telephone Encounter (Signed)
Called and spoke with patients daughter she advised that the patient is now in the donut hole with her insurance and her Trulicity cost went from $45 to $200. Patients daughter is wanting advice on what they should do. Please advise.

## 2022-11-09 NOTE — Telephone Encounter (Signed)
Patient daughter Toniann Fail called in and had some questions regarding Dulaglutide (TRULICITY) 0.75 MG/0.5ML SOPN. She can be reached at [phone]. Thank you!

## 2022-11-12 NOTE — Telephone Encounter (Signed)
Called Toniann Fail.

## 2022-11-13 ENCOUNTER — Other Ambulatory Visit: Payer: Medicare Other | Admitting: Pharmacist

## 2022-11-13 NOTE — Progress Notes (Signed)
11/13/2022 Name: Carly Buckley MRN: [id number] DOB: Jan 15, 1946  No chief complaint on file.   Carly Buckley is a 77 y.o. year old female who presented for a telephone visit.   They were referred to the pharmacist by their PCP for assistance in managing diabetes, hypertension, and hyperlipidemia.    Subjective:  Care Team: Primary Care Provider: Doreene Nest, NP ; Next Scheduled Visit: 12/2022  Medication Access/Adherence  Current Pharmacy:  [facility] - [place], [place] - [address] [place] [address] Phone: [phone] Fax: [phone]   Patient reports affordability concerns with their medications: Yes  Patient reports access/transportation concerns to their pharmacy: No  Patient reports adherence concerns with their medications:  No    Calls the office, reports Trulicity has become expensive. Appears patient is in the Medicare Coverage Gap.   Diabetes:  Current medications: metformin XR 1000 mg twice daily, Trulicity 0.75 mg weekly, glipizide 10 mg twice daily  Previous medications: Rybelsus- did not tolerate 7 mg dose per prior notes, though patient does not remember this.  Patient reports fastings readings have been in the 120-130s lately  Hypertension:  Current medications: amlodipine 10 mg daily, hydrochlorothiazide 25 mg daily Medications previously tried: lisinopril - tongue swelling  Patient has a validated, automated, upper arm home BP cuff Current blood pressure readings readings: does not recall recent readings   Hyperlipidemia/ASCVD Risk Reduction  Current lipid lowering medications: atorvastatin 20 mg daily, metoprolol succinate 100 mg daily  Antiplatelet regimen: none due to DOAC  Atrial Fibrillation:  Current medications: Rate control: metoprolol succinate 100 mg daily Anticoagulation Regimen: Eliquis 5 mg twice daily  Reports Eliquis has also become expensive due to  the Coverage Gap   Objective:  Lab Results  Component Value Date   HGBA1C 10.3 (H) 08/28/2022    Lab Results  Component Value Date   CREATININE 0.88 08/28/2022   BUN 25 (H) 08/28/2022   NA 136 08/28/2022   K 3.6 08/28/2022   CL 97 08/28/2022   CO2 27 08/28/2022    Lab Results  Component Value Date   CHOL 129 08/28/2022   HDL 63.70 08/28/2022   LDLCALC 54 08/28/2022   TRIG 57.0 08/28/2022   CHOLHDL 2 08/28/2022    Medications Reviewed Today     Reviewed by Alden Hipp, RPH-CPP (Pharmacist) on 11/13/22 at 0950  Med List Status: <None>   Medication Order Taking? Sig Documenting Provider Last Dose Status Informant  amLODipine (NORVASC) 10 MG tablet [id number] Yes TAKE 1 TABLET BY MOUTH DAILY FOR BLOOD PRESSURE Doreene Nest, NP Taking Active   atorvastatin (LIPITOR) 20 MG tablet [id number] Yes TAKE 1 TABLET BY MOUTH DAILY FOR CHOLESTEROL Doreene Nest, NP Taking Active   blood glucose meter kit and supplies KIT [id number]  Dispense based on patient and insurance preference. Use up to four times daily as directed. (FOR ICD-9 250.00, 250.01). Doreene Nest, NP  Active   Calcium Carb-Cholecalciferol (CALCIUM+D3 PO) [id number]  Take 1 tablet by mouth daily. [provider]  Active Self  Dulaglutide (TRULICITY) 0.75 MG/0.5ML SOPN [id number] No Inject 0.75 mg into the skin once a week. for diabetes.  Patient not taking: Reported on 11/13/2022   Doreene Nest, NP Not Taking Active   ELIQUIS 5 MG TABS tablet [id number] Yes TAKE 1 TABLET BY MOUTH TWICE DAILY Nahser, Deloris Ping, MD Taking Active   glipiZIDE (GLUCOTROL) 10 MG tablet [id number] Yes TAKE  1 TABLET BY MOUTH TWICE DAILY FOR DIABETES Doreene Nest, NP Taking Active   hydrochlorothiazide (HYDRODIURIL) 25 MG tablet [id number] Yes TAKE 1 TABLET BY MOUTH DAILY FOR HIGH BLOOD PRESSURE Doreene Nest, NP Taking Active   metFORMIN (GLUCOPHAGE-XR) 500 MG 24 hr tablet [id number] Yes Take 2 tablets  (1,000 mg total) by mouth 2 (two) times daily. for diabetes. Doreene Nest, NP Taking Active   metoprolol succinate (TOPROL-XL) 100 MG 24 hr tablet [id number] Yes TAKE ONE TABLET BY MOUTH TWICE DAILY WITH OR IMMEDIATELY FOLLOWING A MEAL Nahser, Deloris Ping, MD Taking Active   Multiple Vitamin (MULTIVITAMIN WITH MINERALS) TABS tablet [id number]  Take 1 tablet by mouth daily. [provider]  Active Self  Lum Babe test strip [id number]  USE FOUR TIMES DAILY AS DIRECTED Doreene Nest, NP  Active   potassium chloride (KLOR-CON) 10 MEQ tablet [id number] Yes TAKE 1 TABLET BY MOUTH DAILY Nahser, Deloris Ping, MD Taking Active   Propylene Glycol 0.6 % SOLN [id number]  Place 1 drop into both eyes daily as needed (for dry eyes). [provider]  Active Self              Assessment/Plan:   Diabetes: - Currently uncontrolled due to medication access - Due to backorders, Trulicity is not available through manufacturer assistance at this time. Could access Ozempic through [employer]. Given prior intolerance to mid dose Rybelsus, would dose Ozempic at low dose 0.25 mg weekly. Will discuss with PCP.  - Recommend to continue metformin and glipizide at this time  - Recommend to check glucose twice daily, fasting and 2 hour post prandial  Hypertension: - Currently uncontrolled - Reviewed appropriate blood pressure monitoring technique and reviewed goal blood pressure. Recommended to check home blood pressure and heart rate daily, document, and provide results in 2 weeks - Recommend to continue current regimen   Hyperlipidemia/ASCVD Risk Reduction: - Currently controlled.  - Recommend to continue current regimen  Atrial Fibrillation: - Currently controlled. Will collaborate with patient, provider, and CPhT team to apply for Eliquis assistance. Discussed prescription out of pocket spend requirement. They will collect from their pharmacy - Recommend to continue current  regimen  Follow Up Plan: PCP in ~ 6 weeks, PharmD ~ 4 weeks after that    Catie Eppie Gibson, PharmD, BCACP, CPP Clinical Pharmacist [facility] [phone]

## 2022-11-20 ENCOUNTER — Other Ambulatory Visit (HOSPITAL_COMMUNITY): Payer: Self-pay

## 2022-11-20 ENCOUNTER — Telehealth: Payer: Self-pay

## 2022-11-20 NOTE — Telephone Encounter (Signed)
Mailing Eliquis and Ozempic applications to patients home.  (Eliquis; Dr. Elease Hashimoto - Ozempic; Dr. Allayne Gitelman)

## 2022-11-20 NOTE — Telephone Encounter (Signed)
-----   Message from [employer] sent at 11/13/2022  4:01 PM EDT ----- Please attempt Ozempic 0.25/0.5 mg app online, please mail Eliquis app. Patient aware of OOP spend requirement. Please fax Ozempic provider to Mayra Reel at [facility], Eliquis provider to Dr. Elease Hashimoto. Thanks!

## 2022-11-22 ENCOUNTER — Other Ambulatory Visit: Payer: Self-pay | Admitting: Primary Care

## 2022-11-22 DIAGNOSIS — E1140 Type 2 diabetes mellitus with diabetic neuropathy, unspecified: Secondary | ICD-10-CM

## 2022-11-28 NOTE — Telephone Encounter (Signed)
Can we re-mail this? They have not received yet.

## 2022-11-30 ENCOUNTER — Ambulatory Visit: Payer: Medicare Other | Admitting: Primary Care

## 2022-12-07 NOTE — Telephone Encounter (Signed)
Called to double check if patient rec'd apps. They were mailed a few days after documentation.

## 2022-12-13 ENCOUNTER — Telehealth: Payer: Self-pay | Admitting: Primary Care

## 2022-12-13 NOTE — Telephone Encounter (Signed)
Pt's husband brought by pt's BP log for Clark to review. Log is in clark's folder. Call back # [phone]

## 2022-12-18 NOTE — Telephone Encounter (Signed)
Please thank the patient for bringing this by.  Blood pressure readings are overall okay.  Continue amlodipine 10 mg daily and metoprolol succinate 100 mg daily, and hydrochlorothiazide 25 mg daily for blood pressure.

## 2022-12-18 NOTE — Telephone Encounter (Signed)
Rec'd patients completed portions, oop expenses, and copy of insurance card. Faxed pcp pages for novo nordisk and BMS to Dr. Chestine Spore.

## 2022-12-19 NOTE — Telephone Encounter (Signed)
Left message, per DPR advising of Kates message. Advised patient to return call to office if she has any questions.

## 2022-12-24 ENCOUNTER — Other Ambulatory Visit: Payer: Self-pay | Admitting: Primary Care

## 2022-12-24 DIAGNOSIS — E1140 Type 2 diabetes mellitus with diabetic neuropathy, unspecified: Secondary | ICD-10-CM

## 2022-12-26 ENCOUNTER — Other Ambulatory Visit: Payer: Self-pay

## 2022-12-26 NOTE — Telephone Encounter (Signed)
Forms were faxed back to me, but with no signature.   Faxing again. Please have pcp sign and fax back to [phone]

## 2022-12-26 NOTE — Telephone Encounter (Signed)
Completed form for Ozempic.  Placed in Kelli's inbox.

## 2022-12-27 NOTE — Telephone Encounter (Signed)
Form has been faxed to requested number.

## 2022-12-28 NOTE — Telephone Encounter (Signed)
Rec'd signed novo nordisk pcp page.   Awaiting BMS pcp page.

## 2022-12-28 NOTE — Telephone Encounter (Signed)
PAP: Application for Ozempic has been submitted to PAP Companies: NovoNordisk, via fax  Refaxing [employer] provider page to pcp again.

## 2023-01-01 ENCOUNTER — Telehealth: Payer: Self-pay | Admitting: Cardiovascular Disease

## 2023-01-01 NOTE — Telephone Encounter (Signed)
Daughter would like to know if you have received patient assistance forms that was faxed over from PCP

## 2023-01-01 NOTE — Telephone Encounter (Signed)
Pt c/o medication issue:  1. Name of Medication:  ELIQUIS 5 MG TABS tablet  2. How are you currently taking this medication (dosage and times per day)?   3. Are you having a reaction (difficulty breathing--STAT)?   4. What is your medication issue?   Patient's daughter states a patient assistance form for Eliquis was faxed to the office today or yesterday and she would like to know if it has been received. Please advise.

## 2023-01-01 NOTE — Telephone Encounter (Signed)
Carly Buckley, form for Eliquis patient assistance was received by [place] but faxed to prescriber in cardiology.

## 2023-01-02 ENCOUNTER — Ambulatory Visit: Payer: Medicare Other | Admitting: Primary Care

## 2023-01-02 NOTE — Telephone Encounter (Signed)
Returned call to daughter and spoke to her informing her that we have not received anything from PCP office. In speaking with her, it sounds like it was the printed application that they were going to help her process, but had told her we would have to do the prescriber page since we are who has her on the Eliquis. Explained to Toniann Fail that I don't need them to send [place] the form as we already have access to printing it, but would need all the tax documentation that goes along with the application and we can take care of getting it submitted it for her. Daughter Toniann Fail is going to email her copies of that they gave to PCP to [employer] email address and told her we'd reach out to her if we need additional information.

## 2023-01-02 NOTE — Telephone Encounter (Signed)
**Note De-Identified Willodene Stallings Obfuscation** The pt's daughter, Toniann Fail e-mailed the pts BMSPAF application along with her out of pocket RX expense report to me. I printed her application and a copy of her Ins card and added with her application.  I completed the providers age of her BMSPAF application for Eliquis assitance and have e-mailed all to Dr Harvie Bridge nurse so she can obtain his signature and date it while he is in clinic on Friday 9/20 and then to fax all to BMSPAF at the fax number written on the cover letter included.

## 2023-01-03 NOTE — Telephone Encounter (Signed)
Could this be routed to Dr. Harvie Bridge team? When they have him sign, can he fax it back to me directly at [phone]? That's my fault. I see originally that's where it was supposed to go. Apologies everyone!!!

## 2023-01-04 NOTE — Telephone Encounter (Signed)
Printed, signed, and faxed with confirmation received.

## 2023-01-04 NOTE — Telephone Encounter (Signed)
Received notification from [employer] regarding approval for OZEMPIC. Patient assistance approved from 01/01/23 to 04/16/23.  Medication will ship to patients home.  Pt ID: [id number]  Company phone: [phone]  _______  Awaiting BMS application.

## 2023-01-11 ENCOUNTER — Telehealth: Payer: Self-pay

## 2023-01-11 ENCOUNTER — Ambulatory Visit: Payer: Medicare Other | Admitting: Primary Care

## 2023-01-11 NOTE — Telephone Encounter (Signed)
Received 4 boxes of Ozempic for patient through PAP.  Patient has been notified.  Placed in middle fridge.

## 2023-01-14 NOTE — Telephone Encounter (Signed)
**Note De-Identified Bryer Cozzolino Obfuscation** Letter received from [facility] Patrena Santalucia fax stating that they have approved the pt for Eliquis assistance until 04/16/2023. [id number]  The letter states that they have notified the pt of this approval as well.

## 2023-01-16 ENCOUNTER — Ambulatory Visit (INDEPENDENT_AMBULATORY_CARE_PROVIDER_SITE_OTHER): Payer: Medicare Other | Admitting: Primary Care

## 2023-01-16 ENCOUNTER — Encounter: Payer: Self-pay | Admitting: Primary Care

## 2023-01-16 VITALS — BP 118/74 | HR 75 | Temp 97.6°F | Ht 65.0 in | Wt 194.0 lb

## 2023-01-16 DIAGNOSIS — Z7984 Long term (current) use of oral hypoglycemic drugs: Secondary | ICD-10-CM

## 2023-01-16 DIAGNOSIS — E1140 Type 2 diabetes mellitus with diabetic neuropathy, unspecified: Secondary | ICD-10-CM

## 2023-01-16 LAB — POCT GLYCOSYLATED HEMOGLOBIN (HGB A1C): Hemoglobin A1C: 9.9 % — AB (ref 4.0–5.6)

## 2023-01-16 NOTE — Telephone Encounter (Signed)
Rec'd patients portion back of BMS application in the middle of 12/2022.  Seen updated telephone note 01/01/23 from Dr. Namon Cirri office. PAP was completed there.

## 2023-01-16 NOTE — Progress Notes (Signed)
Established Patient Office Visit  Subjective   Patient ID: Carly Buckley, female    DOB: 1945-07-15  Age: 77 y.o. MRN: [id number]  Chief Complaint  Patient presents with   Medical Management of Chronic Issues    HPI  Carly Buckley is a pleasant 77 y.o. female with a history of hypertension, paroxysmal atrial fibrillation, uncontrolled type 2 diabetes with neuropathy, obesity, hyperlipidemia, osteoarthritis who presents today for follow-up of diabetes.   Current medications include: Glipizide 10 mg BID, metformin XR 500 mg 2 tablets twice per day. Starting Ozempic 0.25 mg tomorrow.  Her last dose of Trulicity 0.75 mg [place] weekly was approximately 1 month ago when she ran out and due to insurance proved to be cost-prohibitive to continue. She was placed on patient assistance program for Ozempic. She has not yet initiated Ozempic, but she picked up her doses from the clinic on Monday and plans to start taking tomorrow 10/3.    She is checking her blood glucose 1 time daily in the morning and is getting readings of 199-231. When she was on Trulicity, her morning blood glucose readings were 130's.    Last A1C: 9.9 today, previous 10.3 on 08/28/22 Last Eye Exam: UTD  Last Foot Exam: completed today Pneumonia Vaccination: UTD Urine Microalbumin: UTD Statin: Atorvastatin 20 mg daily  Flu shot: declines  Dietary changes since last visit: She is trying to stay away from sugary foods. She eats frozen meals regularly.   Exercise: No regular exercise. She uses a cane to ambulate.    Review of Systems  Eyes:  Negative for blurred vision and double vision.  Respiratory:  Negative for shortness of breath.   Cardiovascular:  Negative for chest pain.  Gastrointestinal:  Negative for constipation, diarrhea, nausea and vomiting.  Musculoskeletal:  Negative for falls.  Neurological:  Negative for dizziness and headaches.      Objective:     BP 118/74   Pulse 75   Temp 97.6 F (36.4 C)  (Temporal)   Ht 5\' 5"  (1.651 m)   Wt 194 lb (88 kg)   SpO2 95%   BMI 32.28 kg/m   BP Readings from Last 3 Encounters:  01/16/23 118/74  08/28/22 (!) 160/64  07/06/22 (!) 142/65   Wt Readings from Last 3 Encounters:  01/16/23 194 lb (88 kg)  08/28/22 192 lb (87.1 kg)  07/06/22 186 lb (84.4 kg)    Physical Exam Cardiovascular:     Rate and Rhythm: Normal rate and regular rhythm.     Pulses: Normal pulses.  Pulmonary:     Effort: Pulmonary effort is normal.     Breath sounds: Normal breath sounds.  Skin:    General: Skin is warm and dry.  Neurological:     Mental Status: She is alert. Mental status is at baseline.     Results for orders placed or performed in visit on 01/16/23  POCT glycosylated hemoglobin (Hb A1C)  Result Value Ref Range   Hemoglobin A1C 9.9 (A) 4.0 - 5.6 %   HbA1c POC (<> result, manual entry)     HbA1c, POC (prediabetic range)     HbA1c, POC (controlled diabetic range)        The ASCVD Risk score (Arnett DK, et al., 2019) failed to calculate for the following reasons:   The valid total cholesterol range is 130 to 320 mg/dL    Assessment & Plan:   Problem List Items Addressed This Visit       Endocrine  DM (diabetes mellitus) (HCC) - Primary    Uncontrolled with A1c 9.9 today.  Continue Glipizide 10 mg BID.   Start Semaglutide (Ozempic) 0.25 mg [place] weekly for 4 weeks, then increase dose to 0.5 mg [place] weekly.   Reduce Metformin XR 500 mg to 2 tablets in the morning and 1 tablet at night to try to help with gas and bloating.  Discussed importance of continuing to work on healthy lifestyle changes and diet.   Follow up in 3 months.       Relevant Orders   POCT glycosylated hemoglobin (Hb A1C) (Completed)    No follow-ups on file.    Benito Mccreedy, RN

## 2023-01-16 NOTE — Progress Notes (Signed)
Subjective:    Patient ID: Carly Buckley, female    DOB: December 15, 1945, 77 y.o.   MRN: [id number]  HPI  Carly Buckley is a very pleasant 77 y.o. female with a history of hypertension, paroxysmal atrial fibrillation, type 2 diabetes, hyperlipidemia, who presents today for follow-up of diabetes.  She has also noticed passing more gas than usual.   Current medications include: Ozempic 0.25 mg weekly, glipizide 10 mg twice daily, metformin XR 1000 mg twice daily.  Trulicity became cost prohibitive so patient was applied for Ozempic patient assistance. She stopped her Trulicity about 1 month ago. She plans to start Ozempic tomorrow.  She is checking her blood glucose 1-2 times daily and is getting readings of: high 100s to low 200s  .  Last A1C: 10.3 in May 2024, 9.9 today Last Eye Exam: Up-to-date Last Foot Exam: Due Pneumonia Vaccination: 2018 Urine Microalbumin: Up-to-date Statin: Atorvastatin  Dietary changes since last visit: Working to reduce intake of sugary foods. Mostly eats frozen foods, tries to make better choices.    Exercise: None  BP Readings from Last 3 Encounters:  01/16/23 118/74  08/28/22 (!) 160/64  07/06/22 (!) 142/65     Review of Systems  Respiratory:  Negative for shortness of breath.   Cardiovascular:  Negative for chest pain.  Gastrointestinal:  Negative for constipation and diarrhea.  Allergic/Immunologic: Positive for environmental allergies.  Neurological:  Negative for dizziness, numbness and headaches.         Past Medical History:  Diagnosis Date   Angioedema    Arthritis    Colon cancer (HCC)    colon ca dx 07, colon resection   Complication of anesthesia    Diabetes mellitus    Diverticulitis    Dysrhythmia    palpitations   Helicobacter pylori (H. pylori)    Hypertension    Malignant neoplasm of colon    Neuropathy, idiopathic    PONV (postoperative nausea and vomiting)    Postoperative anemia due to acute blood loss  08/05/2012   Tachycardia 08/19/2012   Type II diabetes mellitus without mention of complication     Social History   Socioeconomic History   Marital status: Married    Spouse name: Not on file   Number of children: Not on file   Years of education: Not on file   Highest education level: Not on file  Occupational History   Not on file  Tobacco Use   Smoking status: Former    Current packs/day: 0.00    Types: Cigarettes    Quit date: 06/18/1972    Years since quitting: 50.6   Smokeless tobacco: Never  Vaping Use   Vaping status: Never Used  Substance and Sexual Activity   Alcohol use: No   Drug use: No   Sexual activity: Not Currently  Other Topics Concern   Not on file  Social History Narrative   Married.   4 children, 7 grandchildren, 3 great grandchildren.   Retired. Once worked at [employer].   Enjoys walking, puzzle books, spending time with family.   Social Determinants of Health   Financial Resource Strain: Low Risk  (07/05/2022)   Overall Financial Resource Strain (CARDIA)    Difficulty of Paying Living Expenses: Not hard at all  Food Insecurity: No Food Insecurity (07/05/2022)   Hunger Vital Sign    Worried About Running Out of Food in the Last Year: Never true    Ran Out of Food in the Last  Year: Never true  Transportation Needs: No Transportation Needs (07/05/2022)   PRAPARE - Administrator, Civil Service (Medical): No    Lack of Transportation (Non-Medical): No  Physical Activity: Sufficiently Active (07/05/2022)   Exercise Vital Sign    Days of Exercise per Week: 7 days    Minutes of Exercise per Session: 40 min  Stress: No Stress Concern Present (07/05/2022)   [employer] of Occupational Health - Occupational Stress Questionnaire    Feeling of Stress : Not at all  Social Connections: Moderately Integrated (07/05/2022)   Social Connection and Isolation Panel [NHANES]    Frequency of Communication with Friends and Family: More  than three times a week    Frequency of Social Gatherings with Friends and Family: More than three times a week    Attends Religious Services: More than 4 times per year    Active Member of [employer] or Organizations: No    Attends Banker Meetings: Never    Marital Status: Married  Catering manager Violence: Not At Risk (07/05/2022)   Humiliation, Afraid, Rape, and Kick questionnaire    Fear of Current or Ex-Partner: No    Emotionally Abused: No    Physically Abused: No    Sexually Abused: No    Past Surgical History:  Procedure Laterality Date   COLON RESECTION  2007   colon cancer   STERIOD INJECTION Left 08/04/2012   Procedure: STEROID INJECTION;  Surgeon: Loanne Drilling, MD;  Location: [facility];  Service: Orthopedics;  Laterality: Left;   TONSILLECTOMY     TOTAL KNEE ARTHROPLASTY Right 08/04/2012   Procedure: TOTAL KNEE ARTHROPLASTY;  Surgeon: Loanne Drilling, MD;  Location: [facility];  Service: Orthopedics;  Laterality: Right;   TUBAL LIGATION      Family History  Problem Relation Age of Onset   Lung cancer Mother 2   Kidney failure Father 60   Diabetes Sister    Diabetes Brother    Brain cancer Daughter     Allergies  Allergen Reactions   Lisinopril Swelling   Morphine And Codeine     Nauseated    Oxycodone Nausea Only    Current Outpatient Medications on File Prior to Visit  Medication Sig Dispense Refill   amLODipine (NORVASC) 10 MG tablet TAKE 1 TABLET BY MOUTH DAILY FOR BLOOD PRESSURE 90 tablet 2   atorvastatin (LIPITOR) 20 MG tablet TAKE 1 TABLET BY MOUTH DAILY FOR CHOLESTEROL 90 tablet 2   blood glucose meter kit and supplies KIT Dispense based on patient and insurance preference. Use up to four times daily as directed. (FOR ICD-9 250.00, 250.01). 1 each 0   Calcium Carb-Cholecalciferol (CALCIUM+D3 PO) Take 1 tablet by mouth daily.     Dulaglutide (TRULICITY) 0.75 MG/0.5ML SOPN Inject 0.75 mg into the skin once a week. for diabetes. 6 mL 0   ELIQUIS  5 MG TABS tablet TAKE 1 TABLET BY MOUTH TWICE DAILY 60 tablet 5   glipiZIDE (GLUCOTROL) 10 MG tablet TAKE 1 TABLET BY MOUTH TWICE DAILY FOR DIABETES 180 tablet 0   hydrochlorothiazide (HYDRODIURIL) 25 MG tablet TAKE 1 TABLET BY MOUTH DAILY FOR HIGH BLOOD PRESSURE 90 tablet 2   metFORMIN (GLUCOPHAGE-XR) 500 MG 24 hr tablet Take 2 tablets (1,000 mg total) by mouth 2 (two) times daily. for diabetes. 360 tablet 0   metoprolol succinate (TOPROL-XL) 100 MG 24 hr tablet TAKE ONE TABLET BY MOUTH TWICE DAILY WITH OR IMMEDIATELY FOLLOWING A MEAL 180 tablet 3  Multiple Vitamin (MULTIVITAMIN WITH MINERALS) TABS tablet Take 1 tablet by mouth daily.     ONETOUCH VERIO test strip USE FOUR TIMES DAILY AS DIRECTED 300 strip 1   potassium chloride (KLOR-CON) 10 MEQ tablet TAKE 1 TABLET BY MOUTH DAILY 90 tablet 3   Propylene Glycol 0.6 % SOLN Place 1 drop into both eyes daily as needed (for dry eyes).     No current facility-administered medications on file prior to visit.    BP 118/74   Pulse 75   Temp 97.6 F (36.4 C) (Temporal)   Ht 5\' 5"  (1.651 m)   Wt 194 lb (88 kg)   SpO2 95%   BMI 32.28 kg/m  Objective:   Physical Exam Cardiovascular:     Rate and Rhythm: Normal rate and regular rhythm.  Pulmonary:     Effort: Pulmonary effort is normal.     Breath sounds: Normal breath sounds.  Musculoskeletal:     Cervical back: Neck supple.  Skin:    General: Skin is warm and dry.  Neurological:     Mental Status: She is alert and oriented to person, place, and time.  Psychiatric:        Mood and Affect: Mood normal.           Assessment & Plan:  Type 2 diabetes mellitus with diabetic neuropathy, without long-term current use of insulin (HCC) Assessment & Plan: Slightly improved but uncontrolled with A1c 9.9 today.  Continue Glipizide 10 mg BID.   She will begin Semaglutide (Ozempic) 0.25 mg [place] weekly for 4 weeks, then increase dose to 0.5 mg [place] weekly.  Detailed instructions provided today  regarding administration.   Reduce Metformin XR 500 mg to 2 tablets in the morning and 1 tablet at night to try to help with gas and bloating.  Will try further reductions if applicable.  Discussed importance of continuing to work on healthy lifestyle changes and diet.   Follow up in 3 months.   I evaluated patient, was consulted regarding treatment, and agree with assessment and plan per Tenna Delaine, RN, DNP student.   Mayra Reel, NP-C   Orders: -     POCT glycosylated hemoglobin (Hb A1C)        Doreene Nest, NP

## 2023-01-16 NOTE — Patient Instructions (Signed)
Start Ozempic 0.25 mg injected weekly for 4 weeks. Then increase Ozempic dose to 0.5 mg once weekly thereafter.  You can try Loratadine (Claritin) 10 mg daily for allergies.   Try reducing your metformin pill to 2 tablets in the morning and 1 tablet at night to help with gas and bloating.  Follow up in 3 months.   It was a pleasure to see you today!

## 2023-01-16 NOTE — Assessment & Plan Note (Addendum)
Slightly improved but uncontrolled with A1c 9.9 today.  Continue Glipizide 10 mg BID.   She will begin Semaglutide (Ozempic) 0.25 mg [place] weekly for 4 weeks, then increase dose to 0.5 mg [place] weekly.  Detailed instructions provided today regarding administration.   Reduce Metformin XR 500 mg to 2 tablets in the morning and 1 tablet at night to try to help with gas and bloating.  Will try further reductions if applicable.  Discussed importance of continuing to work on healthy lifestyle changes and diet.   Follow up in 3 months.   I evaluated patient, was consulted regarding treatment, and agree with assessment and plan per Tenna Delaine, RN, DNP student.   Mayra Reel, NP-C

## 2023-01-22 ENCOUNTER — Other Ambulatory Visit: Payer: Medicare Other | Admitting: Pharmacist

## 2023-01-24 ENCOUNTER — Other Ambulatory Visit: Payer: Medicare Other

## 2023-01-24 NOTE — Progress Notes (Deleted)
01/24/2023 Name: Carly Buckley MRN: [id number] DOB: Aug 16, 1945  Subjective  No chief complaint on file.   Reason for visit: Carly Buckley is a 77 y.o. year old female who presented for a telephone visit.   They were referred to the pharmacist by their PCP for assistance in managing diabetes.   Care Team: Primary Care Provider: Doreene Nest, NP  Reason for visit: ?  Carly Buckley is a 77 y.o. female with a history of diabetes (type 2), who presents today for a follow up diabetes pharmacotherapy visit.? Pertinent PMH also includes HTN, AF, HLD.   Known DM Complications: no known complications    Date of Last Diabetes Related Visit: 11/13/22 with Pharmacist; 01/16/23 with PCP At Last Diabetes Related Visit: ?  BG uncontrolled due to medication access. Due to backorder, Trulicity not available through MAP.  [employer] MAP application submitted for Ozempic 0.25 mg weekly     Since Last visit / History of Present Illness: ?  Patient reports implementing plan from last visit. Denies adverse effects with initiation of ***.   Prescription drug coverage: Payor: Advertising copywriter MEDICARE / Plan: [facility] MEDICARE / Product Type: *No Product type* / .   Reports that all medications are *** affordable.  Current Patient Assistance: None*** Medication Adherence: Patient denies*** missing doses of their medication.    Reported DM Regimen: ?  Glipizide 10 mg twice daily Ozempic 0.25 mg sq once weekly (started 01/17/23***) Metformin XR 1000 mg twice daily    DM medications tried in the past:?  Trulicity (backorder, unavailable through MAP, transitioned to Ozempic) Semaglutide oral (Rybelsus) (Did not tolerate 7 mg dose)  Overall, patient thinks that blood sugars are {Blank single:19197::"***","lower","unchanged","higher"} since last diabetes related visit.   SMBG Per BG meter: ? Checking 1-2 times daily, fasting*** ***Upper 100s to low 200s mg/dL.    Hypo/Hyperglycemia: ?  Symptoms of  hypoglycemia since last visit:? {Blank multiple:19197::"yes","no","n/a","***"}  If yes, it was treated by: {Blank multiple:19197::"***","n/a"}  Symptoms of hyperglycemia since last visit:? {Blank multiple:19197::"yes","no","n/a","***"} - {symptoms; hyperglycemia:17903}  Reported Diet: Patient typically eats *** meals per day. Denies skipping meals ***.   Exercise: ***  DM Prevention:  Statin: Taking; moderate intensity.?  History of chronic kidney disease? no History of albuminuria? yes, last UACR on 08/28/22 = 11.5 mg/g (previously 31 mg/g 2020) ACE/ARB - Not taking d/t report of angioedema; Urine MA/CR Ratio -  normal <30 mg/g though uptrending .  Last eye exam: ***04/27/2020; No retinopathy present Last foot exam: 01/16/2023 Tobacco Use: Former smoker  Immunizations:? Flu: Due, Pneumococcal: PPSV23 (05/08/16) PCV13 (11/30/14); Shingrix: No Record - DUE; Covid (No record - DUE)   Cardiovascular Risk Reduction History of clinical ASCVD? no The ASCVD Risk score (Arnett DK, et al., 2019) failed to calculate for the following reasons:   The valid total cholesterol range is 130 to 320 mg/dL History of heart failure? no N/A History of hyperlipidemia? yes Current BMI: 32.3 kg/m2 (Ht 65 in, Wt 88 kg) Taking statin? yes; moderate intensity (atorvastatin 20 mg daily) Taking aspirin? not indicated; Not taking   Taking SGLT-2i? no Taking GLP- 1 RA? yes   Reported HTN Regimen: ?  Amlodipine 10 mg daily Hydrochlorothiazide 25 mg daily Metoprolol succinate 100 mg twice daily   HTN medications tried in the past:?  lisinopril (tongue swelling/angioedema)     _______________________________________________  Objective    Review of Systems:?  Constitutional:? No fever, chills or unintentional weight loss  Cardiovascular:? No chest pain or pressure,  shortness of breath, dyspnea on exertion, orthopnea or LE edema  Pulmonary:? No cough or shortness of breath  GI:? No nausea, vomiting,  constipation, diarrhea, abdominal pain, dyspepsia, change in bowel habits  Endocrine:? No polyuria, polyphagia or blurred vision  Psych:? No depression, anxiety, insomnia    Physical Examination:  Vitals:  Wt Readings from Last 3 Encounters:  01/16/23 194 lb (88 kg)  08/28/22 192 lb (87.1 kg)  07/06/22 186 lb (84.4 kg)   BP Readings from Last 3 Encounters:  01/16/23 118/74  08/28/22 (!) 160/64  07/06/22 (!) 142/65   Pulse Readings from Last 3 Encounters:  01/16/23 75  08/28/22 70  03/20/22 82     Labs:?  Lab Results  Component Value Date   HGBA1C 9.9 (A) 01/16/2023   HGBA1C 10.3 (H) 08/28/2022   HGBA1C 8.0 (A) 03/20/2022   GLUCOSE 244 (H) 08/28/2022   MICRALBCREAT 11.5 08/28/2022   MICRALBCREAT 7.4 04/25/2021   MICRALBCREAT 2.1 06/12/2019   CREATININE 0.88 08/28/2022   CREATININE 0.89 01/08/2022   CREATININE 0.92 05/12/2021   GFR 63.70 08/28/2022   GFR 60.94 05/12/2021   GFR 77.90 04/25/2021    Lab Results  Component Value Date   CHOL 129 08/28/2022   LDLCALC 54 08/28/2022   LDLCALC 52 04/25/2021   LDLCALC 53 06/12/2019   HDL 63.70 08/28/2022   HDL 47.30 04/25/2021   HDL 47.90 06/12/2019   AST 15 08/28/2022   AST 23 04/25/2021   ALT 14 08/28/2022   ALT 19 04/25/2021      Chemistry      Component Value Date/Time   NA 136 08/28/2022 1026   NA 139 01/08/2022 1632   NA 140 08/25/2014 1241   K 3.6 08/28/2022 1026   K 4.0 08/25/2014 1241   CL 97 08/28/2022 1026   CL 102 07/24/2012 1450   CO2 27 08/28/2022 1026   CO2 22 08/25/2014 1241   BUN 25 (H) 08/28/2022 1026   BUN 26 01/08/2022 1632   BUN 24.1 08/25/2014 1241   CREATININE 0.88 08/28/2022 1026   CREATININE 0.8 08/25/2014 1241      Component Value Date/Time   CALCIUM 10.1 08/28/2022 1026   CALCIUM 9.9 08/25/2014 1241   ALKPHOS 75 08/28/2022 1026   ALKPHOS 79 08/25/2014 1241   AST 15 08/28/2022 1026   AST 15 08/25/2014 1241   ALT 14 08/28/2022 1026   ALT 17 08/25/2014 1241   BILITOT 0.6  08/28/2022 1026   BILITOT 0.28 08/25/2014 1241       The ASCVD Risk score (Arnett DK, et al., 2019) failed to calculate for the following reasons:   The valid total cholesterol range is 130 to 320 mg/dL  Assessment and Plan:   1. Diabetes, type 2: Uncontrolled per last A1c of 9.9% (01/16/23), improved from 10.3% (08/28/22) with goal <7% without hypoglycemia. Hyperglycemia in part secondary to stopping Trulicity due to backorder. Since last A1c, has started Ozempic and has tolerated her first dose well (01/17/23). Glucometer data shows ***?. Current Regimen: Ozempic 0.25 mg weekly, metformin 1000 mg Bid, glipizide 10 mg BID Continue medications today without changes.  Increase Ozempic after 4 weeks of 0.25 mg injection, as previously instructed.  Diet: *** Exercise: ***  Reviewed signs/symptoms/treatment of hypoglycemia  Next A1c due ~04/18/23 Future Consideration: SGLT2i: Strong recommendation: Given hx angioedema on ACEi, patient would benefit form SGLT2i in the setting of diabetic renal protection. UACR remains <30 mg/g though was elevated >30 mg/g in the past. Recently has trended  upward, last 11.5 mg/g (May).  SU: Ultimate plan to reduce dose/discontinue with titration of GLP1 RA to lower risk of hypoglycemia.  TZD: Avoiding due to possible weight gain and possible increase in fracture risk. More effective options available at this time.  Insulin: Not unreasonable, though defer as last resort or as needed for severe hyperglycemia given risk hypoglycemia. Other safer options at this time.     2. HTN: Controlled based on last clinic BP of 118/74 mmHg (01/16/23), goal <130/80 mmHg. Does not *** monitor BP at home. Denies lightheadedness, dizziness, SOB, CP, vision changes.  Current Regimen: amlodipine 10 mg daily, hydrochlorothiazide 25 mg daily, metoprolol succinate 100 mg BID Continue medications without changes.  Future consideration: RAAS ideal, though avoid in the setting of hx  angioedema on lisinopril   3. ASCVD (primary prevention): The ASCVD Risk score (Arnett DK, et al., 2019) failed to calculate for the following reasons:   The valid total cholesterol range is 130 to 320 mg/dL. On last lipid panel (08/28/22) LDL was 54 mg/dL, at goal of <14 mg/dL.  Key risk factors include: diabetes, hypertension, hyperlipidemia, former smoker, and BMI >30 kg/m2 Current Regimen: atorvastatin 20 mg daily Continue medications today without changes.    4. Healthcare Maintenance:  Pneumococcal - Current status: ***  Shingles - Current status: No record - DUE Influenza - Current status: DUE  Due to receive the following vaccines: Influenza and Shingrix   Follow Up Follow up with clinical pharmacist via phone in *** weeks.  ? Mid-December between PCP appointments.   Future Appointments  Date Time Provider Department Center  01/24/2023 11:00 AM [facility] CCM PHARMACIST [facility]  02/28/2023  8:00 AM Doreene Nest, NP [facility]  04/23/2023  8:20 AM Doreene Nest, NP [facility]  07/10/2023  1:00 PM [facility] ANNUAL WELLNESS VISIT 1 [facility]    Loree Fee, PharmD Clinical Pharmacist [facility] [phone]

## 2023-01-31 ENCOUNTER — Other Ambulatory Visit: Payer: Medicare Other | Admitting: Pharmacist

## 2023-01-31 NOTE — Progress Notes (Signed)
01/31/2023 Name: Carly Buckley MRN: [id number] DOB: 11-29-1945  Subjective  Chief Complaint  Patient presents with   Diabetes    Reason for visit: Carly Buckley is a 77 y.o. year old female who presented for a telephone visit.   They were referred to the pharmacist by their PCP for assistance in managing diabetes.   Care Team: Primary Care Provider: Doreene Nest, NP  Reason for visit: ?  Carly Buckley is a 77 y.o. female with a history of diabetes (type 2), who presents today for a follow up diabetes pharmacotherapy visit.? Pertinent PMH also includes HTN, AF, HLD.   Known DM Complications: no known complications    Date of Last Diabetes Related Visit: 11/13/22 with Pharmacist; 01/16/23 with PCP At Last Diabetes Related Visit: ?  BG uncontrolled due to medication access. Due to backorder, Trulicity not available through MAP.  NovoNordisk MAP application submitted for Ozempic 0.25 mg weekly    Prescription drug coverage: Payor: Advertising copywriter MEDICARE / Plan: UHC MEDICARE / Product Type: *No Product type* / .   Reports that all medications are affordable.  Current Patient Assistance: NovoNordisk (Ozempic) Medication Adherence: Patient denies missing doses of their medication.   Diabetes Since Last visit / History of Present Illness: ?  Patient reports implementing plan from last visit. Denies adverse effects with Ozempic 0.25 mg weekly (started early October, third dose due today). Feels that previous stomach issue has passed. No stomach/GI concerns at present. Patient Decreased metformin to 1000 mg AM/500 mg PM because of gas/bloating. Feeld this may have helped some.   Reported DM Regimen: ?  Glipizide 10 mg twice daily Ozempic 0.25 mg sq once weekly (started 01/17/23) Metformin XR 1500 mg daily (1000 am, 500 pm)   DM medications tried in the past:?  Trulicity (backorder, unavailable through MAP, transitioned to Ozempic) Semaglutide oral (Rybelsus) (Did not  tolerate 7 mg dose)  Overall, patient thinks that blood sugars are  stable  since last diabetes related visit.   SMBG Per BG meter: ?Checks once daily, fasting 10/17: 205 10/16: 184 10/14: 196 10/13: 194 10/12: 203 10/11: 177   Hypo/Hyperglycemia: ?  Symptoms of hypoglycemia since last visit:? no  If yes, it was treated by: n/a  Symptoms of hyperglycemia since last visit:? no - none  Reported Diet: Patient typically eats 3 meals per day. Denies skipping meals.   Exercise: [place] the road with an indoor track/recreation area. Used to walk laps with her husband (7 laps 1-2 times daily). Has not been recently but has set goal to get back into it.   DM Prevention:  Statin: Taking; moderate intensity.?  History of chronic kidney disease? no History of albuminuria? yes, last UACR on 08/28/22 = 11.5 mg/g (previously 31 mg/g 2020) ACE/ARB - Not taking d/t report of angioedema; Urine MA/CR Ratio -  normal <30 mg/g though uptrending .  Last eye exam: 04/27/2020; No retinopathy present ( Last foot exam: 01/16/2023 Tobacco Use: Former smoker  Immunizations:? Flu: Due, Pneumococcal: PPSV23 (05/08/16) PCV13 (11/30/14); Shingrix: No Record - DUE; Covid (No record - DUE)  Cardiovascular Risk Reduction History of clinical ASCVD? no History of heart failure? no N/A History of hyperlipidemia? yes Current BMI: 32.3 kg/m2 (Ht 65 in, Wt 88 kg) Taking statin? yes; moderate intensity (atorvastatin 20 mg daily) Taking aspirin? not indicated; Not taking   Taking SGLT-2i? no Taking GLP- 1 RA? yes   Reported HTN Regimen: ?  Amlodipine 10 mg daily Hydrochlorothiazide 25  mg daily Metoprolol succinate 100 mg twice daily   HTN medications tried in the past:?  lisinopril (tongue swelling)    _______________________________________________  Objective    Review of Systems:?  Constitutional:? No fever, chills or unintentional weight loss  Cardiovascular:? No chest pain or pressure, shortness of  breath, dyspnea on exertion, orthopnea or LE edema  Pulmonary:? No cough or shortness of breath  GI:? No nausea, vomiting, constipation, diarrhea, abdominal pain, dyspepsia, change in bowel habits  Endocrine:? No polyuria, polyphagia or blurred vision  Psych:? No depression, anxiety, insomnia    Physical Examination:  Vitals:  Wt Readings from Last 3 Encounters:  01/16/23 194 lb (88 kg)  08/28/22 192 lb (87.1 kg)  07/06/22 186 lb (84.4 kg)   BP Readings from Last 3 Encounters:  01/16/23 118/74  08/28/22 (!) 160/64  07/06/22 (!) 142/65   Pulse Readings from Last 3 Encounters:  01/16/23 75  08/28/22 70  03/20/22 82     Labs:?  Lab Results  Component Value Date   HGBA1C 9.9 (A) 01/16/2023   HGBA1C 10.3 (H) 08/28/2022   HGBA1C 8.0 (A) 03/20/2022   GLUCOSE 244 (H) 08/28/2022   MICRALBCREAT 11.5 08/28/2022   MICRALBCREAT 7.4 04/25/2021   MICRALBCREAT 2.1 06/12/2019   CREATININE 0.88 08/28/2022   CREATININE 0.89 01/08/2022   CREATININE 0.92 05/12/2021   GFR 63.70 08/28/2022   GFR 60.94 05/12/2021   GFR 77.90 04/25/2021    Lab Results  Component Value Date   CHOL 129 08/28/2022   LDLCALC 54 08/28/2022   LDLCALC 52 04/25/2021   LDLCALC 53 06/12/2019   HDL 63.70 08/28/2022   HDL 47.30 04/25/2021   HDL 47.90 06/12/2019   AST 15 08/28/2022   AST 23 04/25/2021   ALT 14 08/28/2022   ALT 19 04/25/2021      Chemistry      Component Value Date/Time   NA 136 08/28/2022 1026   NA 139 01/08/2022 1632   NA 140 08/25/2014 1241   K 3.6 08/28/2022 1026   K 4.0 08/25/2014 1241   CL 97 08/28/2022 1026   CL 102 07/24/2012 1450   CO2 27 08/28/2022 1026   CO2 22 08/25/2014 1241   BUN 25 (H) 08/28/2022 1026   BUN 26 01/08/2022 1632   BUN 24.1 08/25/2014 1241   CREATININE 0.88 08/28/2022 1026   CREATININE 0.8 08/25/2014 1241      Component Value Date/Time   CALCIUM 10.1 08/28/2022 1026   CALCIUM 9.9 08/25/2014 1241   ALKPHOS 75 08/28/2022 1026   ALKPHOS 79 08/25/2014  1241   AST 15 08/28/2022 1026   AST 15 08/25/2014 1241   ALT 14 08/28/2022 1026   ALT 17 08/25/2014 1241   BILITOT 0.6 08/28/2022 1026   BILITOT 0.28 08/25/2014 1241       The ASCVD Risk score (Arnett DK, et al., 2019) failed to calculate for the following reasons:   The valid total cholesterol range is 130 to 320 mg/dL  Assessment and Plan:   1. Diabetes, type 2: Uncontrolled per last A1c of 9.9% (01/16/23), improved from 10.3% (08/28/22) with goal <7% without hypoglycemia. Hyperglycemia in part secondary to stopping Trulicity due to backorder previously. Tolerated her first few doses of Ozempic well. Glucometer data shows fasting sugars remain above goal in upper 100s to low 200s mg/dL. Given no adverse effects, patient agreeable to increasing Ozempic after her fourth dose of 0.25 mg.  Current Regimen: Ozempic 0.25 mg weekly, metformin 1000mg  AM/500 mg PM, glipizide 10  mg BID Continue medications  After 4 weeks of Ozempic 0.25 mg injection, Increase to 0.5 mg weekly Exercise: None currently; Set goal to start walking at the [facility] recreation building with husband  Reviewed signs/symptoms/treatment of hypoglycemia  Next A1c due ~04/18/23 Future Consideration: SGLT2i: Strong recommendation: Given hx angioedema on ACEi, patient would benefit form SGLT2i in the setting of diabetic renal protection. UACR remains <30 mg/g though was elevated >30 mg/g in the past. Recently has trended upward, last 11.5 mg/g (May).  SU: Ultimate plan to reduce dose/discontinue with titration of GLP1 RA to lower risk of hypoglycemia.  TZD: Avoiding due to possible weight gain and increase in fracture risk. More effective options available at this time.  Insulin: Not unreasonable, though defer as last resort or as needed for severe hyperglycemia given risk hypoglycemia. Other safer options at this time.     2. HTN: Controlled based on last clinic BP of 118/74 mmHg (01/16/23), goal <130/80 mmHg. Does not monitor BP at  home. Denies lightheadedness, dizziness, SOB, CP, vision changes.  Current Regimen: amlodipine 10 mg daily, hydrochlorothiazide 25 mg daily, metoprolol succinate 100 mg BID Continue medications without changes.  Future consideration: RAAS ideal, though avoid in the setting of hx angioedema on lisinopril   3. ASCVD (primary prevention): Controlled - On last lipid panel (08/28/22) LDL was 54 mg/dL, at goal of <16 mg/dL.  Key risk factors include: diabetes, hypertension, hyperlipidemia, former smoker, and BMI >30 kg/m2 Current Regimen: atorvastatin 20 mg daily Continue medications today without changes.    4. Healthcare Maintenance:  Pneumococcal - Current status: Up to date (last dose PPSV23 was 2018 after age 74)  Shingles - Current status: No record - DUE Influenza - Current status: DUE  Due to receive the following vaccines: Influenza and Shingrix   Follow Up Follow up with clinical pharmacist via phone ~4 weeks after next PCP appointment.  ?    Future Appointments  Date Time Provider Department Center  02/28/2023  8:00 AM Doreene Nest, NP [facility]  03/28/2023 11:00 AM [facility] PHARMACIST [facility]  04/23/2023  8:20 AM Doreene Nest, NP [facility]  07/10/2023  1:00 PM [facility] ANNUAL WELLNESS VISIT 1 [facility]    Loree Fee, PharmD Clinical Pharmacist [facility] [phone]

## 2023-01-31 NOTE — Patient Instructions (Addendum)
Ms. Carly Buckley,   It was a pleasure to see you today! As we discussed:?   Continue taking your Ozempic at the lowest dose (0.25 mg once a week) until you have given a total of 4 injections. When your fifth injection is due, you may increase the Ozempic dose to 0.5 mg once weekly. To do this, keep twisting the end of your pen past the 0.25 mark and you will see the 0.5 mg dose.   We also discussed that you are due for the following vaccines: Shingrix vaccine is recommended in all people over the age of 56 to help protect against getting shingles. This vaccine is covered by Medicare and should be free at your local community pharmacy without a prescription.  Influenza (flu shot) can be obtained at your local pharmacy, or you can get it the next time you are here to see [place] in clinic)  Please reach out prior to your next scheduled appointment should you have any questions or concerns.   Thank you!   Future Appointments  Date Time Provider Department Center  02/28/2023  8:00 AM Doreene Nest, NP [facility]  03/28/2023 11:00 AM [facility] PHARMACIST [facility]  04/23/2023  8:20 AM Doreene Nest, NP [facility]  07/10/2023  1:00 PM [facility] ANNUAL WELLNESS VISIT 1 [facility]   Loree Fee, PharmD Clinical Pharmacist [facility] [phone]

## 2023-02-04 ENCOUNTER — Other Ambulatory Visit: Payer: Self-pay | Admitting: Cardiovascular Disease

## 2023-02-28 ENCOUNTER — Ambulatory Visit (INDEPENDENT_AMBULATORY_CARE_PROVIDER_SITE_OTHER): Payer: Medicare Other | Admitting: Primary Care

## 2023-02-28 ENCOUNTER — Encounter: Payer: Self-pay | Admitting: Primary Care

## 2023-02-28 VITALS — BP 168/80 | HR 82 | Temp 97.8°F | Ht 65.0 in | Wt 190.0 lb

## 2023-02-28 DIAGNOSIS — N898 Other specified noninflammatory disorders of vagina: Secondary | ICD-10-CM | POA: Diagnosis not present

## 2023-02-28 DIAGNOSIS — N9489 Other specified conditions associated with female genital organs and menstrual cycle: Secondary | ICD-10-CM | POA: Diagnosis not present

## 2023-02-28 LAB — POC URINALSYSI DIPSTICK (AUTOMATED)
Bilirubin, UA: NEGATIVE
Blood, UA: NEGATIVE
Glucose, UA: NEGATIVE
Ketones, UA: NEGATIVE
Nitrite, UA: NEGATIVE
Protein, UA: NEGATIVE
Spec Grav, UA: 1.01 (ref 1.010–1.025)
Urobilinogen, UA: 0.2 U/dL
pH, UA: 6 (ref 5.0–8.0)

## 2023-02-28 NOTE — Assessment & Plan Note (Addendum)
Differentials include glucose induced yeast infection, vaginal atrophy from menopause, cystitis.  UA today with trace leuks, otherwise negative. Culture ordered and pending.  Collected wet prep vaginal swab today Declines STD testing.   Consider clobetasol, fluconazole, vs estrogen cream. Await results.

## 2023-02-28 NOTE — Progress Notes (Signed)
Subjective:    Patient ID: Carly Buckley, female    DOB: 11/02/1945, 77 y.o.   MRN: [id number]  Vaginal Itching The patient's primary symptoms include vaginal discharge. The patient's pertinent negatives include no pelvic pain. Pertinent negatives include no abdominal pain, dysuria, fever, frequency, hematuria or nausea.    Carly Buckley is a very pleasant 77 y.o. female with a history of hypertension, paroxysmal atrial fibrillation, type 2 diabetes, colon cancer, hyperlipidemia who presents today to discuss vaginal itching.  Her vaginal itching began about 3 months ago. She's also noticed burning to the labia majora. She has chronic whitish vaginal discharge which is no worse than usual.   She's been using Vagisil OTC with temporary improvement.   She denies vaginal bleeding, hematuria, pelvic pain, dysuria. Her blood glucose levels are running in the mid 100's fasting in the AM. Sometimes in the low 200s.   Previously managed on Premarin cream, has not used in years. Doesn't recall using.    Review of Systems  Constitutional:  Negative for fever.  Gastrointestinal:  Negative for abdominal pain and nausea.  Genitourinary:  Positive for vaginal discharge. Negative for dysuria, frequency, hematuria, pelvic pain and vaginal bleeding.       Vaginal itching         Past Medical History:  Diagnosis Date   Angioedema    Arthritis    Colon cancer (HCC)    colon ca dx 07, colon resection   Complication of anesthesia    Diabetes mellitus    Diverticulitis    Dysrhythmia    palpitations   Helicobacter pylori (H. pylori)    Hypertension    Malignant neoplasm of colon    Neuropathy, idiopathic    PONV (postoperative nausea and vomiting)    Postoperative anemia due to acute blood loss 08/05/2012   Tachycardia 08/19/2012   Type II diabetes mellitus without mention of complication     Social History   Socioeconomic History   Marital status: Married    Spouse name: Not on file    Number of children: Not on file   Years of education: Not on file   Highest education level: Not on file  Occupational History   Not on file  Tobacco Use   Smoking status: Former    Current packs/day: 0.00    Types: Cigarettes    Quit date: 06/18/1972    Years since quitting: 50.7   Smokeless tobacco: Never  Vaping Use   Vaping status: Never Used  Substance and Sexual Activity   Alcohol use: No   Drug use: No   Sexual activity: Not Currently  Other Topics Concern   Not on file  Social History Narrative   Married.   4 children, 7 grandchildren, 3 great grandchildren.   Retired. Once worked at [employer].   Enjoys walking, puzzle books, spending time with family.   Social Determinants of Health   Financial Resource Strain: Low Risk  (07/05/2022)   Overall Financial Resource Strain (CARDIA)    Difficulty of Paying Living Expenses: Not hard at all  Food Insecurity: No Food Insecurity (07/05/2022)   Hunger Vital Sign    Worried About Running Out of Food in the Last Year: Never true    Ran Out of Food in the Last Year: Never true  Transportation Needs: No Transportation Needs (07/05/2022)   PRAPARE - Administrator, Civil Service (Medical): No    Lack of Transportation (Non-Medical): No  Physical Activity:  Sufficiently Active (07/05/2022)   Exercise Vital Sign    Days of Exercise per Week: 7 days    Minutes of Exercise per Session: 40 min  Stress: No Stress Concern Present (07/05/2022)   [employer] of Occupational Health - Occupational Stress Questionnaire    Feeling of Stress : Not at all  Social Connections: Moderately Integrated (07/05/2022)   Social Connection and Isolation Panel [NHANES]    Frequency of Communication with Friends and Family: More than three times a week    Frequency of Social Gatherings with Friends and Family: More than three times a week    Attends Religious Services: More than 4 times per year    Active Member of [employer] or  Organizations: No    Attends Banker Meetings: Never    Marital Status: Married  Catering manager Violence: Not At Risk (07/05/2022)   Humiliation, Afraid, Rape, and Kick questionnaire    Fear of Current or Ex-Partner: No    Emotionally Abused: No    Physically Abused: No    Sexually Abused: No    Past Surgical History:  Procedure Laterality Date   COLON RESECTION  2007   colon cancer   STERIOD INJECTION Left 08/04/2012   Procedure: STEROID INJECTION;  Surgeon: Loanne Drilling, MD;  Location: [facility];  Service: Orthopedics;  Laterality: Left;   TONSILLECTOMY     TOTAL KNEE ARTHROPLASTY Right 08/04/2012   Procedure: TOTAL KNEE ARTHROPLASTY;  Surgeon: Loanne Drilling, MD;  Location: [facility];  Service: Orthopedics;  Laterality: Right;   TUBAL LIGATION      Family History  Problem Relation Age of Onset   Lung cancer Mother 49   Kidney failure Father 65   Diabetes Sister    Diabetes Brother    Brain cancer Daughter     Allergies  Allergen Reactions   Lisinopril Swelling   Morphine And Codeine     Nauseated    Oxycodone Nausea Only    Current Outpatient Medications on File Prior to Visit  Medication Sig Dispense Refill   amLODipine (NORVASC) 10 MG tablet TAKE 1 TABLET BY MOUTH DAILY FOR BLOOD PRESSURE 90 tablet 2   atorvastatin (LIPITOR) 20 MG tablet TAKE 1 TABLET BY MOUTH DAILY FOR CHOLESTEROL 90 tablet 2   blood glucose meter kit and supplies KIT Dispense based on patient and insurance preference. Use up to four times daily as directed. (FOR ICD-9 250.00, 250.01). 1 each 0   Calcium Carb-Cholecalciferol (CALCIUM+D3 PO) Take 1 tablet by mouth daily.     Dulaglutide (TRULICITY) 0.75 MG/0.5ML SOPN Inject 0.75 mg into the skin once a week. for diabetes. 6 mL 0   ELIQUIS 5 MG TABS tablet TAKE 1 TABLET BY MOUTH TWICE DAILY 60 tablet 5   glipiZIDE (GLUCOTROL) 10 MG tablet TAKE 1 TABLET BY MOUTH TWICE DAILY FOR DIABETES 180 tablet 0   hydrochlorothiazide (HYDRODIURIL) 25  MG tablet TAKE 1 TABLET BY MOUTH DAILY FOR HIGH BLOOD PRESSURE 90 tablet 2   metFORMIN (GLUCOPHAGE-XR) 500 MG 24 hr tablet Take 2 tablets (1,000 mg total) by mouth 2 (two) times daily. for diabetes. 360 tablet 0   metoprolol succinate (TOPROL-XL) 100 MG 24 hr tablet TAKE ONE TABLET BY MOUTH TWICE DAILY WITH OR IMMEDIATELY FOLLOWING A MEAL 180 tablet 3   Multiple Vitamin (MULTIVITAMIN WITH MINERALS) TABS tablet Take 1 tablet by mouth daily.     ONETOUCH VERIO test strip USE FOUR TIMES DAILY AS DIRECTED 300 strip 1   potassium  chloride (KLOR-CON) 10 MEQ tablet TAKE 1 TABLET BY MOUTH DAILY 90 tablet 1   Propylene Glycol 0.6 % SOLN Place 1 drop into both eyes daily as needed (for dry eyes).     No current facility-administered medications on file prior to visit.    BP (!) 168/80   Pulse 82   Temp 97.8 F (36.6 C) (Temporal)   Ht 5\' 5"  (1.651 m)   Wt 190 lb (86.2 kg)   SpO2 97%   BMI 31.62 kg/m  Objective:   Physical Exam Exam conducted with a chaperone present.  Cardiovascular:     Rate and Rhythm: Normal rate.  Pulmonary:     Effort: Pulmonary effort is normal.  Genitourinary:    Labia:        Right: Rash present. No tenderness or lesion.        Left: Rash present. No tenderness or lesion.      Vagina: Normal. No vaginal discharge.     Comments: Red, irritated skin to labia minora  Neurological:     Mental Status: She is alert.           Assessment & Plan:  Vaginal itching Assessment & Plan: Differentials include glucose induced yeast infection, vaginal atrophy from menopause, cystitis.  UA today with trace leuks, otherwise negative. Culture ordered and pending.  Collected wet prep vaginal swab today Declines STD testing.   Consider clobetasol, fluconazole, vs estrogen cream. Await results.  Orders: -     POCT Urinalysis Dipstick (Automated) -     WET PREP BY MOLECULAR PROBE -     Urine Culture  Vaginal burning -     POCT Urinalysis Dipstick (Automated) -      WET PREP BY MOLECULAR PROBE -     Urine Culture        Doreene Nest, NP

## 2023-02-28 NOTE — Patient Instructions (Signed)
We will be in touch once we receive your swab results.

## 2023-03-01 ENCOUNTER — Other Ambulatory Visit: Payer: Self-pay | Admitting: Primary Care

## 2023-03-01 DIAGNOSIS — B3731 Acute candidiasis of vulva and vagina: Secondary | ICD-10-CM

## 2023-03-01 LAB — WET PREP BY MOLECULAR PROBE
Candida species: DETECTED — AB
Gardnerella vaginalis: NOT DETECTED
MICRO NUMBER:: 15731320
SPECIMEN QUALITY:: ADEQUATE
Trichomonas vaginosis: NOT DETECTED

## 2023-03-01 LAB — URINE CULTURE
MICRO NUMBER:: 15731319
SPECIMEN QUALITY:: ADEQUATE

## 2023-03-01 MED ORDER — FLUCONAZOLE 150 MG PO TABS: ORAL_TABLET | ORAL | 0 refills | Status: DC

## 2023-03-12 ENCOUNTER — Other Ambulatory Visit: Payer: Self-pay | Admitting: Primary Care

## 2023-03-12 DIAGNOSIS — E1140 Type 2 diabetes mellitus with diabetic neuropathy, unspecified: Secondary | ICD-10-CM

## 2023-03-19 DIAGNOSIS — M1712 Unilateral primary osteoarthritis, left knee: Secondary | ICD-10-CM | POA: Diagnosis not present

## 2023-03-27 DIAGNOSIS — M1712 Unilateral primary osteoarthritis, left knee: Secondary | ICD-10-CM | POA: Diagnosis not present

## 2023-03-28 ENCOUNTER — Other Ambulatory Visit: Payer: Medicare Other

## 2023-03-28 ENCOUNTER — Encounter: Payer: Self-pay | Admitting: Pharmacist

## 2023-03-28 NOTE — Progress Notes (Unsigned)
03/28/2023 Name: Carly Buckley MRN: [id number] DOB: 10-15-1945  Subjective  No chief complaint on file.   Reason for visit: Carly Buckley is a 77 y.o. year old female who presented for a telephone visit.   They were referred to the pharmacist by their PCP for assistance in managing diabetes.   Care Team: Primary Care Provider: Doreene Nest, NP  Reason for visit: ?  Carly Buckley is a 77 y.o. female with a history of diabetes (type 2), who presents today for a follow up diabetes pharmacotherapy visit.? Pertinent PMH also includes HTN, AF, HLD.   Known DM Complications: no known complications    Date of Last Diabetes Related Visit: 01/31/23 with Pharmacist; 02/28/23 with PCP At Last Diabetes Related Visit: ?  10/17: Ozempic 0.25 started 10/3;  After 4 weeks, increase to 0.5 mg; pt self-decreased metformin to 1000 mg AM/500 mg PM because of gas/bloating. 8/14: BG uncontrolled due to medication access. Due to backorder, Trulicity not available through MAP. NovoNordisk MAP application submitted for Ozempic 0.25 mg weekly    Prescription drug coverage: Payor: Advertising copywriter MEDICARE / Plan: UHC MEDICARE / Product Type: *No Product type* / .   Reports that all medications are affordable.  Current Patient Assistance: NovoNordisk (Ozempic) Medication Adherence: Patient denies missing doses of their medication.   Diabetes Since Last visit / History of Present Illness: ?  Patient reports implementing plan from last visit. Denies adverse effects with titration to Ozempic 0.5 mg (increased ***). Feels that previous stomach issue has passed. No stomach/GI concerns at present.   Last visit, patient had decreased metformin to 1000 mg AM/500 mg PM because of gas/bloating. Feeld this may have helped some.   Reported DM Regimen: ?  Glipizide 10 mg twice daily Ozempic 0.5 mg sq once weekly Metformin XR 1500 mg daily (1000 am, 500 pm)   DM medications tried in the past:?  Trulicity  (backorder, unavailable through MAP, transitioned to Ozempic) Semaglutide oral (Rybelsus) (Did not tolerate 7 mg dose)  Overall, patient thinks that blood sugars are  stable  since last diabetes related visit.   SMBG Per BG meter: ?Checks once daily, fasting 10/17: 205 10/16: 184 10/14: 196 10/13: 194 10/12: 203 10/11: 177   Hypo/Hyperglycemia: ?  Symptoms of hypoglycemia since last visit:? no  Symptoms of hyperglycemia since last visit:? no   Reported Diet: Patient typically eats 3 meals per day. Denies skipping meals.   Exercise: [place] the road with an indoor track/recreation area. Used to walk laps with her husband (7 laps 1-2 times daily). Has not been recently but has set goal to get back into it.   DM Prevention:  Statin: Taking; moderate intensity.?  History of chronic kidney disease? no History of albuminuria? yes, last UACR on 08/28/22 = 11.5 mg/g (previously 31 mg/g 2020) ACE/ARB - Not taking d/t report of angioedema; Urine MA/CR Ratio -  normal <30 mg/g though uptrending .  Last eye exam: 04/27/2020; No retinopathy present Last foot exam: 01/16/2023 Tobacco Use: Former smoker  Immunizations:? Flu: Due, Pneumococcal: PPSV23 (05/08/16) PCV13 (11/30/14); Shingrix: No Record - DUE; Covid (No record - DUE)  Cardiovascular Risk Reduction History of clinical ASCVD? no History of heart failure? no N/A History of hyperlipidemia? yes Current BMI: 32.3 kg/m2 (Ht 65 in, Wt 88 kg) Taking statin? yes; moderate intensity (atorvastatin 20 mg daily) Taking aspirin? not indicated; Not taking   Taking SGLT-2i? no Taking GLP- 1 RA? yes   Reported HTN  Regimen: ?  Amlodipine 10 mg daily Hydrochlorothiazide 25 mg daily Metoprolol succinate 100 mg twice daily   HTN medications tried in the past:?  lisinopril (tongue swelling)    _______________________________________________  Objective    Review of Systems:?  Constitutional:? No fever, chills or unintentional weight loss   Cardiovascular:? No chest pain or pressure, shortness of breath, dyspnea on exertion, orthopnea or LE edema  Pulmonary:? No cough or shortness of breath  GI:? No nausea, vomiting, constipation, diarrhea, abdominal pain, dyspepsia, change in bowel habits  Endocrine:? No polyuria, polyphagia or blurred vision  Psych:? No depression, anxiety, insomnia    Physical Examination:  Vitals:  Wt Readings from Last 3 Encounters:  02/28/23 86.2 kg (190 lb)  01/16/23 88 kg (194 lb)  08/28/22 87.1 kg (192 lb)   BP Readings from Last 3 Encounters:  02/28/23 (!) 168/80  01/16/23 118/74  08/28/22 (!) 160/64   Pulse Readings from Last 3 Encounters:  02/28/23 82  01/16/23 75  08/28/22 70     Labs:?  Lab Results  Component Value Date   HGBA1C 9.9 (A) 01/16/2023   HGBA1C 10.3 (H) 08/28/2022   HGBA1C 8.0 (A) 03/20/2022   GLUCOSE 244 (H) 08/28/2022   MICRALBCREAT 11.5 08/28/2022   MICRALBCREAT 7.4 04/25/2021   MICRALBCREAT 2.1 06/12/2019   CREATININE 0.88 08/28/2022   CREATININE 0.89 01/08/2022   CREATININE 0.92 05/12/2021   GFR 63.70 08/28/2022   GFR 60.94 05/12/2021   GFR 77.90 04/25/2021    Lab Results  Component Value Date   CHOL 129 08/28/2022   LDLCALC 54 08/28/2022   LDLCALC 52 04/25/2021   LDLCALC 53 06/12/2019   HDL 63.70 08/28/2022   HDL 47.30 04/25/2021   HDL 47.90 06/12/2019   AST 15 08/28/2022   AST 23 04/25/2021   ALT 14 08/28/2022   ALT 19 04/25/2021      Chemistry      Component Value Date/Time   NA 136 08/28/2022 1026   NA 139 01/08/2022 1632   NA 140 08/25/2014 1241   K 3.6 08/28/2022 1026   K 4.0 08/25/2014 1241   CL 97 08/28/2022 1026   CL 102 07/24/2012 1450   CO2 27 08/28/2022 1026   CO2 22 08/25/2014 1241   BUN 25 (H) 08/28/2022 1026   BUN 26 01/08/2022 1632   BUN 24.1 08/25/2014 1241   CREATININE 0.88 08/28/2022 1026   CREATININE 0.8 08/25/2014 1241      Component Value Date/Time   CALCIUM 10.1 08/28/2022 1026   CALCIUM 9.9 08/25/2014  1241   ALKPHOS 75 08/28/2022 1026   ALKPHOS 79 08/25/2014 1241   AST 15 08/28/2022 1026   AST 15 08/25/2014 1241   ALT 14 08/28/2022 1026   ALT 17 08/25/2014 1241   BILITOT 0.6 08/28/2022 1026   BILITOT 0.28 08/25/2014 1241       The ASCVD Risk score (Arnett DK, et al., 2019) failed to calculate for the following reasons:   The valid total cholesterol range is 130 to 320 mg/dL  Assessment and Plan:   1. Diabetes, type 2: Uncontrolled per last A1c of 9.9% (01/16/23), improved from 10.3% (08/28/22) with goal <7% without hypoglycemia. Hyperglycemia in part secondary to stopping Trulicity due to backorder previously. Tolerated her first few doses of Ozempic well. Glucometer data shows fasting sugars remain above goal in upper 100s to low 200s mg/dL. Given no adverse effects, patient agreeable to increasing Ozempic after her fourth dose of 0.25 mg.  Current Regimen: Ozempic 0.25  mg weekly, metformin 1000mg  AM/500 mg PM, glipizide 10 mg BID Continue medications  After 4 weeks of Ozempic 0.25 mg injection, Increase to 0.5 mg weekly Exercise: None currently; Set goal to start walking at the [facility] recreation building with husband  Reviewed signs/symptoms/treatment of hypoglycemia  Next A1c due ~04/18/23 Future Consideration: SGLT2i: Strong recommendation: Given hx angioedema on ACEi, patient would benefit form SGLT2i in the setting of diabetic renal protection. UACR remains <30 mg/g though was elevated >30 mg/g in the past. Recently has trended upward, last 11.5 mg/g (May).  SU: Ultimate plan to reduce dose/discontinue with titration of GLP1 RA to lower risk of hypoglycemia.  TZD: Avoiding due to possible weight gain and increase in fracture risk. More effective options available at this time.  Insulin: Not unreasonable, though defer as last resort or as needed for severe hyperglycemia given risk hypoglycemia. Other safer options at this time.     2. HTN: Controlled based on last clinic BP of  118/74 mmHg (01/16/23), goal <130/80 mmHg. Does not monitor BP at home. Denies lightheadedness, dizziness, SOB, CP, vision changes.  Current Regimen: amlodipine 10 mg daily, hydrochlorothiazide 25 mg daily, metoprolol succinate 100 mg BID Continue medications without changes.  Future consideration: RAAS ideal, though avoid in the setting of hx angioedema on lisinopril   3. ASCVD (primary prevention): Controlled - On last lipid panel (08/28/22) LDL was 54 mg/dL, at goal of <16 mg/dL.  Key risk factors include: diabetes, hypertension, hyperlipidemia, former smoker, and BMI >30 kg/m2 Current Regimen: atorvastatin 20 mg daily Continue medications today without changes.    4. Healthcare Maintenance:  Pneumococcal - Current status: Up to date (last dose PPSV23 was 2018 after age 61)  Shingles - Current status: No record - DUE Influenza - Current status: DUE  Due to receive the following vaccines: Influenza and Shingrix   Follow Up Follow up with clinical pharmacist via phone ~4 weeks after next PCP appointment.  ?    Future Appointments  Date Time Provider Department Center  03/28/2023 11:00 AM [facility] CCM PHARMACIST [facility]  04/23/2023  8:20 AM Doreene Nest, NP [facility]  07/10/2023  1:00 PM [facility] ANNUAL WELLNESS VISIT 1 [facility]    Loree Fee, PharmD Clinical Pharmacist [facility] [phone]

## 2023-03-28 NOTE — Progress Notes (Signed)
Attempted to contact patient for scheduled appointment for dibaetes medication management. Left HIPAA compliant message for patient to return my call at their convenience.   Loree Fee, PharmD Clinical Pharmacist [facility] [phone]

## 2023-04-01 ENCOUNTER — Telehealth: Payer: Self-pay

## 2023-04-03 DIAGNOSIS — M1712 Unilateral primary osteoarthritis, left knee: Secondary | ICD-10-CM | POA: Diagnosis not present

## 2023-04-23 ENCOUNTER — Ambulatory Visit: Payer: Medicare Other | Admitting: Primary Care

## 2023-05-01 ENCOUNTER — Encounter: Payer: Self-pay | Admitting: Primary Care

## 2023-05-01 ENCOUNTER — Ambulatory Visit: Payer: Medicare Other | Admitting: Primary Care

## 2023-05-01 VITALS — BP 118/70 | HR 72 | Temp 97.6°F | Ht 65.0 in | Wt 193.0 lb

## 2023-05-01 DIAGNOSIS — E1140 Type 2 diabetes mellitus with diabetic neuropathy, unspecified: Secondary | ICD-10-CM

## 2023-05-01 DIAGNOSIS — Z7984 Long term (current) use of oral hypoglycemic drugs: Secondary | ICD-10-CM | POA: Diagnosis not present

## 2023-05-01 LAB — POCT GLYCOSYLATED HEMOGLOBIN (HGB A1C): Hemoglobin A1C: 8.3 % — AB (ref 4.0–5.6)

## 2023-05-01 MED ORDER — SEMAGLUTIDE (1 MG/DOSE) 4 MG/3ML ~~LOC~~ SOPN: 1.0000 mg | PEN_INJECTOR | SUBCUTANEOUS | 0 refills | Status: AC

## 2023-05-01 MED ORDER — METFORMIN HCL ER 500 MG PO TB24: ORAL_TABLET | ORAL | Status: DC

## 2023-05-01 NOTE — Progress Notes (Signed)
 Subjective:    Patient ID: Carly Buckley, female    DOB: 08-24-1945, 78 y.o.   MRN: [id number]  HPI  Carly Buckley is a very pleasant 78 y.o. female with a history of type 2 diabetes, paroxysmal atrial fibrillation, hypertension, insomnia, colon cancer, hyperlipidemia who presents today for follow-up of diabetes.  Current medications include: Glipizide  10 mg twice daily, metformin  ER 1000 mg in AM 500 gm HS, Ozempic  0.5 mg weekly  She is on patient assistance for Ozempic  0.5 mg weekly. She has noticed hiccups several times daily. This began when she started Ozempic .   She is checking her blood glucose 1 times daily and is getting readings of:  AM fasting: mid to high 100s  Last A1C: 9.9 in October 2024, 8.3 today Last Eye Exam: Up-to-date Last Foot Exam: Up-to-date Pneumonia Vaccination: 2018 Urine Microalbumin: Up-to-date Statin: Atorvastatin   Dietary changes since last visit: None   Exercise: None  Wt Readings from Last 3 Encounters:  05/01/23 193 lb (87.5 kg)  02/28/23 190 lb (86.2 kg)  01/16/23 194 lb (88 kg)        Review of Systems  Respiratory:  Negative for shortness of breath.   Cardiovascular:  Negative for chest pain.  Gastrointestinal:  Negative for abdominal pain and nausea.       Hiccups  Neurological:  Positive for numbness. Negative for dizziness.         Past Medical History:  Diagnosis Date   Angioedema    Arthritis    Colon cancer (HCC)    colon ca dx 07, colon resection   Complication of anesthesia    Diabetes mellitus    Diverticulitis    Dysrhythmia    palpitations   Helicobacter pylori (H. pylori)    Hypertension    Malignant neoplasm of colon    Neuropathy, idiopathic    PONV (postoperative nausea and vomiting)    Postoperative anemia due to acute blood loss 08/05/2012   Tachycardia 08/19/2012   Type II diabetes mellitus without mention of complication     Social History   Socioeconomic History   Marital status:  Married    Spouse name: Not on file   Number of children: Not on file   Years of education: Not on file   Highest education level: Not on file  Occupational History   Not on file  Tobacco Use   Smoking status: Former    Current packs/day: 0.00    Types: Cigarettes    Quit date: 06/18/1972    Years since quitting: 50.9   Smokeless tobacco: Never  Vaping Use   Vaping status: Never Used  Substance and Sexual Activity   Alcohol  use: No   Drug use: No   Sexual activity: Not Currently  Other Topics Concern   Not on file  Social History Narrative   Married.   4 children, 7 grandchildren, 3 great grandchildren.   Retired. Once worked at [employer].   Enjoys walking, puzzle books, spending time with family.   Social Drivers of Corporate investment banker Strain: Low Risk  (07/05/2022)   Overall Financial Resource Strain (CARDIA)    Difficulty of Paying Living Expenses: Not hard at all  Food Insecurity: No Food Insecurity (07/05/2022)   Hunger Vital Sign    Worried About Running Out of Food in the Last Year: Never true    Ran Out of Food in the Last Year: Never true  Transportation Needs: No Transportation Needs (07/05/2022)  PRAPARE - Administrator, Civil Service (Medical): No    Lack of Transportation (Non-Medical): No  Physical Activity: Sufficiently Active (07/05/2022)   Exercise Vital Sign    Days of Exercise per Week: 7 days    Minutes of Exercise per Session: 40 min  Stress: No Stress Concern Present (07/05/2022)   [employer] of Occupational Health - Occupational Stress Questionnaire    Feeling of Stress : Not at all  Social Connections: Moderately Integrated (07/05/2022)   Social Connection and Isolation Panel [NHANES]    Frequency of Communication with Friends and Family: More than three times a week    Frequency of Social Gatherings with Friends and Family: More than three times a week    Attends Religious Services: More than 4 times per year     Active Member of [employer] or Organizations: No    Attends Banker Meetings: Never    Marital Status: Married  Catering manager Violence: Not At Risk (07/05/2022)   Humiliation, Afraid, Rape, and Kick questionnaire    Fear of Current or Ex-Partner: No    Emotionally Abused: No    Physically Abused: No    Sexually Abused: No    Past Surgical History:  Procedure Laterality Date   COLON RESECTION  2007   colon cancer   STERIOD INJECTION Left 08/04/2012   Procedure: STEROID INJECTION;  Surgeon: Aurther Blue, MD;  Location: [facility];  Service: Orthopedics;  Laterality: Left;   TONSILLECTOMY     TOTAL KNEE ARTHROPLASTY Right 08/04/2012   Procedure: TOTAL KNEE ARTHROPLASTY;  Surgeon: Aurther Blue, MD;  Location: [facility];  Service: Orthopedics;  Laterality: Right;   TUBAL LIGATION      Family History  Problem Relation Age of Onset   Lung cancer Mother 52   Kidney failure Father 67   Diabetes Sister    Diabetes Brother    Brain cancer Daughter     Allergies  Allergen Reactions   Lisinopril  Swelling   Morphine And Codeine     Nauseated    Oxycodone  Nausea Only    Current Outpatient Medications on File Prior to Visit  Medication Sig Dispense Refill   amLODipine  (NORVASC ) 10 MG tablet TAKE 1 TABLET BY MOUTH DAILY FOR BLOOD PRESSURE 90 tablet 2   atorvastatin  (LIPITOR) 20 MG tablet TAKE 1 TABLET BY MOUTH DAILY FOR CHOLESTEROL 90 tablet 2   blood glucose meter kit and supplies KIT Dispense based on patient and insurance preference. Use up to four times daily as directed. (FOR ICD-9 250.00, 250.01). 1 each 0   Calcium  Carb-Cholecalciferol (CALCIUM +D3 PO) Take 1 tablet by mouth daily.     ELIQUIS  5 MG TABS tablet TAKE 1 TABLET BY MOUTH TWICE DAILY 60 tablet 5   glipiZIDE  (GLUCOTROL ) 10 MG tablet TAKE 1 TABLET BY MOUTH TWICE DAILY FOR DIABETES 180 tablet 0   hydrochlorothiazide  (HYDRODIURIL ) 25 MG tablet TAKE 1 TABLET BY MOUTH DAILY FOR HIGH BLOOD PRESSURE 90 tablet 2    metoprolol  succinate (TOPROL -XL) 100 MG 24 hr tablet TAKE ONE TABLET BY MOUTH TWICE DAILY WITH OR IMMEDIATELY FOLLOWING A MEAL 180 tablet 3   Multiple Vitamin (MULTIVITAMIN WITH MINERALS) TABS tablet Take 1 tablet by mouth daily.     ONETOUCH VERIO test strip USE FOUR TIMES DAILY AS DIRECTED 300 strip 1   potassium chloride  (KLOR-CON ) 10 MEQ tablet TAKE 1 TABLET BY MOUTH DAILY 90 tablet 1   Propylene Glycol 0.6 % SOLN Place 1 drop into  both eyes daily as needed (for dry eyes).     No current facility-administered medications on file prior to visit.    BP 118/70   Pulse 72   Temp 97.6 F (36.4 C) (Temporal)   Ht 5\' 5"  (1.651 m)   Wt 193 lb (87.5 kg)   SpO2 97%   BMI 32.12 kg/m  Objective:   Physical Exam Cardiovascular:     Rate and Rhythm: Normal rate and regular rhythm.  Pulmonary:     Effort: Pulmonary effort is normal.     Breath sounds: Normal breath sounds.  Musculoskeletal:     Cervical back: Neck supple.  Skin:    General: Skin is warm and dry.  Neurological:     Mental Status: She is alert and oriented to person, place, and time.  Psychiatric:        Mood and Affect: Mood normal.           Assessment & Plan:  Type 2 diabetes mellitus with diabetic neuropathy, without long-term current use of insulin  (HCC) Assessment & Plan: Improved but still above goal with A1c of 8.3 today.  Long discussion today regarding treatment options.  Continue metformin  ER 1000 mg in a.m. and 500 mg in p.m. Continue glipizide  10 mg twice daily.  Increase Ozempic  to 1 mg weekly.  She is on patient assistance, so if the prescription is too expensive at the pharmacy then we will renew her patient assistance for the 1 mg dose.  Follow-up in May 2025.  Orders: -     POCT glycosylated hemoglobin (Hb A1C) -     Semaglutide  (1 MG/DOSE); Inject 1 mg as directed once a week. for diabetes.  Dispense: 9 mL; Refill: 0 -     metFORMIN  HCl ER; Take 2 tablets by mouth every morning and 1  tablet every evening for diabetes.        Orianna Biskup K Akeia Perot, NP

## 2023-05-01 NOTE — Assessment & Plan Note (Signed)
 Improved but still above goal with A1c of 8.3 today.  Long discussion today regarding treatment options.  Continue metformin  ER 1000 mg in a.m. and 500 mg in p.m. Continue glipizide  10 mg twice daily.  Increase Ozempic  to 1 mg weekly.  She is on patient assistance, so if the prescription is too expensive at the pharmacy then we will renew her patient assistance for the 1 mg dose.  Follow-up in May 2025.

## 2023-05-01 NOTE — Patient Instructions (Signed)
 We increased the dose of your Ozempic  (medication for diabetes) to 1 mg weekly.  I sent Ozempic  1 mg dose to the pharmacy.  Please call me if your Ozempic  prescription is too expensive.  Please schedule your physical for mid May.  It was a pleasure to see you today!

## 2023-05-02 ENCOUNTER — Ambulatory Visit: Payer: Self-pay | Admitting: Primary Care

## 2023-05-02 ENCOUNTER — Telehealth: Payer: Self-pay | Admitting: Primary Care

## 2023-05-02 NOTE — Telephone Encounter (Signed)
Copied from CRM [id number]. Topic: Clinical - Prescription Issue >> May 02, 2023 10:13 AM Pascal Lux wrote: Reason for CRM: Patient daughter Carly Buckley called and stated Carly Buckley (pharmacy department) - Helped her mom get her Ozempic medication through the patient assistance program for Ozempic in the past and she is requesting information on how she can get further assistance with this new dosage change. [phone]

## 2023-05-02 NOTE — Telephone Encounter (Signed)
Patient's daughter has already been contacted by a nurse and has no further questions.   Copied from CRM [id number]. Topic: Clinical - Medication Question >> May 02, 2023 10:08 AM Pascal Lux wrote: Reason for CRM: Patient daughter Toniann Fail called and stated that her medication dosing was increased to Ozempic 1mg  and patient currently has the Ozempic .5mg  pen - Is is she able to take 2 shots to equal the 1mg ? - Please call [phone].   Reason for Disposition  [1] Follow-up call to recent contact AND [2] information only call, no triage required  Answer Assessment - Initial Assessment Questions 1. REASON FOR CALL or QUESTION: "What is your reason for calling today?" or "How can I best help you?" or "What question do you have that I can help answer?"     Patient's daughter has already spoken with a nurse about the Ozempic doses and states she did not need any further help.  Protocols used: Information Only Call - No Triage-A-AH

## 2023-05-02 NOTE — Telephone Encounter (Signed)
Copied from CRM [id number]. Topic: Clinical - Medication Question >> May 02, 2023 10:08 AM Pascal Lux wrote: Reason for CRM: Patient daughter Toniann Fail called and stated that her medication dosing was increased to Ozempic 1mg  and patient currently has the Ozempic .5mg  pen - Is is she able to take 2 shots to equal the 1mg ? - Please call [phone].  1/16- Called patient's daughter to advise of Ozempic doses, no answer, left vmail

## 2023-05-02 NOTE — Telephone Encounter (Signed)
Yes, okay to take two of the 0.5 mg doses to equal 1 mg.  Patient and I discussed this yesterday during her visit.  The patient was to call me today to let me know if the Ozempic 1 mg dose was not covered by her insurance were too expensive.  Is this the case?  Was she able to pick up the 1 mg dose at the pharmacy?

## 2023-05-02 NOTE — Telephone Encounter (Signed)
Noted  

## 2023-05-02 NOTE — Telephone Encounter (Signed)
Patient's daughter called in stating that her mother was seen in the office yesterday and her prescription for Ozempic was changed from 0.5mg  to 1.0mg . Daughter is asking if it would be okay for her mother to take 2 doses of the 0.5mg  Ozempic that she already has. This RN advised that she can do so, as long as the medication is not expired. Patient's daughter also asked for her mother to be re-enrolled in the assistance program for this medication. This RN advised that I would route this conversation to the clinic to start the process. Patient complied.

## 2023-05-02 NOTE — Telephone Encounter (Signed)
Called and spoke with patients daughter, she stated the Ozempic was too expensive and wanted to see about going through patient assistance again.  See other phone note from today, I routed to pharmacy team to help with.

## 2023-05-07 ENCOUNTER — Telehealth: Payer: Self-pay

## 2023-05-07 NOTE — Telephone Encounter (Signed)
Left voicemail for patient regarding 2025 PAP re-enrollment for Ozempic

## 2023-05-07 NOTE — Telephone Encounter (Signed)
Patients dose of Ozempic has increased to 1mg  and needs new application done. Please contact patients daughter to get assistance with this. Thanks

## 2023-05-08 NOTE — Telephone Encounter (Signed)
PAP: Application for Ozempic has been submitted to [employer], online  Please note the provider portion of re-enrollment application has been faxed to Vernona Rieger for review

## 2023-05-13 ENCOUNTER — Telehealth: Payer: Self-pay | Admitting: Cardiovascular Disease

## 2023-05-13 NOTE — Telephone Encounter (Signed)
Returned call to daughter to inform that patient would not meet the out of pocket minimum of 3% yet for 2025 and therefore not eligible for patient assistance. She did have PAP through [employer] per chart for 2024. Explained that she can get the 2,000 medicare out of pocket max pro-rated if she calls insurance. Toniann Fail states that her dad gets the [employer] for his Eliquis and wants to know if mother is eligible. Explained I would send to Pt assistance team to find out.

## 2023-05-13 NOTE — Telephone Encounter (Signed)
Pt c/o medication issue:  1. Name of Medication:  ELIQUIS 5 MG TABS tablet  2. How are you currently taking this medication (dosage and times per day)?   3. Are you having a reaction (difficulty breathing--STAT)?   4. What is your medication issue?   Patient's daughter states patient has 1 bottle of Eliquis remaining and she would like to reapply for patient assistance grant.

## 2023-05-14 ENCOUNTER — Telehealth: Payer: Self-pay | Admitting: Pharmacy Technician

## 2023-05-14 NOTE — Telephone Encounter (Signed)
Spoke in detail with daughter Toniann Fail who verbalized understanding of need to meet oop 3% to qualify for patient assistance. She also knows the 2000 max for part D patients this year and that it can be prorated per month. Will discuss with her mother and if wanting to proceed with PAP, will do application and bring it in once she meets the 3%.

## 2023-05-14 NOTE — Telephone Encounter (Signed)
Hello, I just spoke to kayla to confirm that eliquis is not an option through the healthwell grant system. Maybe her mom has another drug on health[employer] grant and they think eliquis is the one being paid? Unfortunately, every option you gave her are the options right now.  Thank you May 13, 2023      05/13/23  5:30 PM Lars Mage, RN routed this conversation to Rx Prior Auth Team Lars Mage, RN     05/13/23  5:30 PM Note Returned call to daughter to inform that patient would not meet the out of pocket minimum of 3% yet for 2025 and therefore not eligible for patient assistance. She did have PAP through [employer] per chart for 2024. Explained that she can get the 2,000 medicare out of pocket max pro-rated if she calls insurance. Toniann Fail states that her dad gets the [employer] for his Eliquis and wants to know if mother is eligible. Explained I would send to Pt assistance team to find out.

## 2023-05-16 NOTE — Telephone Encounter (Signed)
Re-faxed provider portion of application to [employer]

## 2023-05-20 NOTE — Telephone Encounter (Signed)
 A user error has taken place: orders placed in error, not carried out on this patient.

## 2023-05-20 NOTE — Telephone Encounter (Signed)
 Re-faxed provider portion of application to [employer]

## 2023-05-20 NOTE — Telephone Encounter (Signed)
 PAP: Application for Ozempic has been submitted to [employer], online  Please note the provider portion of re-enrollment application has been faxed to Vernona Rieger for review

## 2023-05-21 NOTE — Telephone Encounter (Signed)
Faxed PROVIDER PAGES OF application to [employer] FOR OZEMPIC  at [phone].   PLEASE BE ADVISED

## 2023-05-24 ENCOUNTER — Other Ambulatory Visit: Payer: Self-pay | Admitting: Primary Care

## 2023-05-24 DIAGNOSIS — E1140 Type 2 diabetes mellitus with diabetic neuropathy, unspecified: Secondary | ICD-10-CM

## 2023-05-27 NOTE — Telephone Encounter (Signed)
 PAP: Patient assistance application for Ozempic  has been approved by PAP Companies: NovoNordisk from 05/07/2023 to 04/15/2024. Medication should be delivered to PAP Delivery: Provider's office. For further shipping updates, please contact [employer] at [phone]. Patient ID is: [id number]  PLEASE BE AVISED LETTER OF APPROVAL HAS BEEN SCANNED INTO MEDIA OF CHART

## 2023-05-27 NOTE — Progress Notes (Signed)
 Pharmacy Medication Assistance Program Note    05/27/2023  Patient ID: ENVY LUST, female   DOB: 1946/02/23, 78 y.o.   MRN: [id number]     05/08/2023 05/27/2023  Outreach Medication One  Initial Outreach Date (Medication One) 05/07/2023 05/23/2023  Manufacturer Medication One Novo Nordisk Novo Nordisk  Nordisk Drugs Ozempic  Ozempic   Dose of Ozempic  1mg /week 1MG   Type of Forensic scientist Assistance  Date Application Sent to Prescriber 05/08/2023   Name of Prescriber Tretha Fu Tretha Fu  Date Application Received From Provider  05/23/2023  Date Application Submitted to Manufacturer  05/07/2023  Method Application Sent to Manufacturer  Online  Patient Assistance Determination  Approved  Patient Notification Method  Telephone Call     Signature

## 2023-05-28 ENCOUNTER — Other Ambulatory Visit: Payer: Self-pay

## 2023-05-28 ENCOUNTER — Telehealth: Payer: Self-pay | Admitting: Cardiovascular Disease

## 2023-05-28 DIAGNOSIS — I480 Paroxysmal atrial fibrillation: Secondary | ICD-10-CM

## 2023-05-28 MED ORDER — APIXABAN 5 MG PO TABS: 5.0000 mg | ORAL_TABLET | Freq: Two times a day (BID) | ORAL | 5 refills | Status: DC

## 2023-05-28 NOTE — Telephone Encounter (Signed)
Prescription refill request for Eliquis received. Indication:afib Last office visit:3/24 Scr:0.88  5/24 Age: 78 Weight:87.5  kg  Prescription refilled

## 2023-05-28 NOTE — Telephone Encounter (Signed)
*  STAT* If patient is at the pharmacy, call can be transferred to refill team.   1. Which medications need to be refilled? (please list name of each medication and dose if known) ELIQUIS 5 MG TABS tablet   2. Which pharmacy/location (including street and city if local pharmacy) is medication to be sent to? [facility] - [place], [place] - [address]   3. Do they need a 30 day or 90 day supply? 30

## 2023-05-29 ENCOUNTER — Other Ambulatory Visit: Payer: Medicare Other

## 2023-05-29 NOTE — Progress Notes (Deleted)
 05/29/2023 Name: KLYN KROENING MRN: [id number] DOB: 1945-11-03  Subjective  No chief complaint on file.  Reason for visit: ?  Carly Buckley is a 78 y.o. female with a history of diabetes (type 2), who presents today for a follow up diabetes pharmacotherapy visit.? Pertinent PMH also includes HTN, AF, HLD.  They were referred to the pharmacist by their PCP for assistance in managing diabetes.   Care Team: Primary Care Provider: Doreene Nest, NP   Known DM Complications: no known complications    Date of Last Diabetes Related Visit: 11/13/22 with Pharmacist; 01/16/23 with PCP At Last Diabetes Related Visit: ?  1/15: Increase Ozempic to 1.0 mg weekly 12/12: No show for pharmD follow up BG uncontrolled due to medication access. Due to backorder, Trulicity not available through MAP.  NovoNordisk MAP application submitted for Ozempic 0.25 mg weekly    Prescription drug coverage: Payor: Advertising copywriter MEDICARE / Plan: UHC MEDICARE / Product Type: *No Product type* / .   Reports that all medications are affordable.  Current Patient Assistance: NovoNordisk (Ozempic) Medication Adherence: Patient denies missing doses of their medication.   Diabetes Since Last visit / History of Present Illness: ?  Patient reports implementing plan from last visit. Denies adverse effects with Ozempic 0.25 mg weekly (started early October, third dose due today). Feels that previous stomach issue has passed. No stomach/GI concerns at present. Patient Decreased metformin to 1000 mg AM/500 mg PM because of gas/bloating. Feeld this may have helped some.   Reported DM Regimen: ?  Glipizide 10 mg twice daily Ozempic 0.25 mg sq once weekly (started 01/17/23) Metformin XR 1500 mg daily (1000 am, 500 pm)   DM medications tried in the past:?  Trulicity (backorder, unavailable through MAP, transitioned to Ozempic) Semaglutide oral (Rybelsus) (Did not tolerate 7 mg dose)  Overall, patient thinks that blood  sugars are  stable  since last diabetes related visit.   SMBG Per BG meter: ?Checks once daily, fasting 10/17: 205 10/16: 184 10/14: 196 10/13: 194 10/12: 203 10/11: 177   Hypo/Hyperglycemia: ?  Symptoms of hypoglycemia since last visit:? no  If yes, it was treated by: n/a  Symptoms of hyperglycemia since last visit:? no - none  Reported Diet: Patient typically eats 3 meals per day. Denies skipping meals.   Exercise: [place] the road with an indoor track/recreation area. Used to walk laps with her husband (7 laps 1-2 times daily). Has not been recently but has set goal to get back into it.   DM Prevention:  Statin: Taking; moderate intensity.?  History of chronic kidney disease? no History of albuminuria? yes, last UACR on 08/28/22 = 11.5 mg/g (previously 31 mg/g 2020) ACE/ARB - Not taking d/t report of angioedema; Urine MA/CR Ratio -  normal <30 mg/g though uptrending .  Last eye exam: 04/27/2020; No retinopathy present ( Last foot exam: 01/16/2023 Tobacco Use: Former smoker  Immunizations:? Flu: Due (last 03/20/22), Pneumococcal: PPSV23 (05/08/16) PCV13 (11/30/14); Shingrix: No Record - DUE; Covid (No record - DUE)  Cardiovascular Risk Reduction History of clinical ASCVD? no History of heart failure? no N/A History of hyperlipidemia? yes Current BMI: 32.3 kg/m2 (Ht 65 in, Wt 88 kg) Taking statin? yes; moderate intensity (atorvastatin 20 mg daily) Taking aspirin? not indicated; Not taking   Taking SGLT-2i? no Taking GLP- 1 RA? yes   Reported HTN Regimen: ?  Amlodipine 10 mg daily Hydrochlorothiazide 25 mg daily Metoprolol succinate 100 mg twice daily   HTN  medications tried in the past:?  lisinopril (tongue swelling)    _______________________________________________  Objective    Review of Systems:?  Constitutional:? No fever, chills or unintentional weight loss  Cardiovascular:? No chest pain or pressure, shortness of breath, dyspnea on exertion, orthopnea or LE  edema  Pulmonary:? No cough or shortness of breath  GI:? No nausea, vomiting, constipation, diarrhea, abdominal pain, dyspepsia, change in bowel habits  Endocrine:? No polyuria, polyphagia or blurred vision  Psych:? No depression, anxiety, insomnia    Physical Examination:  Vitals:  Wt Readings from Last 3 Encounters:  05/01/23 193 lb (87.5 kg)  02/28/23 190 lb (86.2 kg)  01/16/23 194 lb (88 kg)   BP Readings from Last 3 Encounters:  05/01/23 118/70  02/28/23 (!) 168/80  01/16/23 118/74   Pulse Readings from Last 3 Encounters:  05/01/23 72  02/28/23 82  01/16/23 75     Labs:?  Lab Results  Component Value Date   HGBA1C 8.3 (A) 05/01/2023   HGBA1C 9.9 (A) 01/16/2023   HGBA1C 10.3 (H) 08/28/2022   GLUCOSE 244 (H) 08/28/2022   MICRALBCREAT 11.5 08/28/2022   MICRALBCREAT 7.4 04/25/2021   MICRALBCREAT 2.1 06/12/2019   CREATININE 0.88 08/28/2022   CREATININE 0.89 01/08/2022   CREATININE 0.92 05/12/2021   GFR 63.70 08/28/2022   GFR 60.94 05/12/2021   GFR 77.90 04/25/2021    Lab Results  Component Value Date   CHOL 129 08/28/2022   LDLCALC 54 08/28/2022   LDLCALC 52 04/25/2021   LDLCALC 53 06/12/2019   HDL 63.70 08/28/2022   HDL 47.30 04/25/2021   HDL 47.90 06/12/2019   AST 15 08/28/2022   AST 23 04/25/2021   ALT 14 08/28/2022   ALT 19 04/25/2021      Chemistry      Component Value Date/Time   NA 136 08/28/2022 1026   NA 139 01/08/2022 1632   NA 140 08/25/2014 1241   K 3.6 08/28/2022 1026   K 4.0 08/25/2014 1241   CL 97 08/28/2022 1026   CL 102 07/24/2012 1450   CO2 27 08/28/2022 1026   CO2 22 08/25/2014 1241   BUN 25 (H) 08/28/2022 1026   BUN 26 01/08/2022 1632   BUN 24.1 08/25/2014 1241   CREATININE 0.88 08/28/2022 1026   CREATININE 0.8 08/25/2014 1241      Component Value Date/Time   CALCIUM 10.1 08/28/2022 1026   CALCIUM 9.9 08/25/2014 1241   ALKPHOS 75 08/28/2022 1026   ALKPHOS 79 08/25/2014 1241   AST 15 08/28/2022 1026   AST 15  08/25/2014 1241   ALT 14 08/28/2022 1026   ALT 17 08/25/2014 1241   BILITOT 0.6 08/28/2022 1026   BILITOT 0.28 08/25/2014 1241       The ASCVD Risk score (Arnett DK, et al., 2019) failed to calculate for the following reasons:   The valid total cholesterol range is 130 to 320 mg/dL  Assessment and Plan:   1. Diabetes, type 2: Uncontrolled per last A1c of 9.9% (01/16/23), improved from 10.3% (08/28/22) with goal <7% without hypoglycemia. Hyperglycemia in part secondary to stopping Trulicity due to backorder previously. Tolerated her first few doses of Ozempic well. Glucometer data shows fasting sugars remain above goal in upper 100s to low 200s mg/dL. Given no adverse effects, patient agreeable to increasing Ozempic after her fourth dose of 0.25 mg.  Current Regimen: Ozempic 0.25 mg weekly, metformin 1000mg  AM/500 mg PM, glipizide 10 mg BID Continue medications  After 4 weeks of Ozempic 0.25 mg  injection, Increase to 0.5 mg weekly Exercise: None currently; Set goal to start walking at the [facility] recreation building with husband  Reviewed signs/symptoms/treatment of hypoglycemia  Next A1c due ~04/18/23 Future Consideration: SGLT2i: Strong recommendation: Given hx angioedema on ACEi, patient would benefit form SGLT2i in the setting of diabetic renal protection. UACR remains <30 mg/g though was elevated >30 mg/g in the past. Recently has trended upward, last 11.5 mg/g (May).  SU: Ultimate plan to reduce dose/discontinue with titration of GLP1 RA to lower risk of hypoglycemia.  TZD: Avoiding due to possible weight gain and increase in fracture risk. More effective options available at this time.  Insulin: Not unreasonable, though defer as last resort or as needed for severe hyperglycemia given risk hypoglycemia. Other safer options at this time.     2. HTN: Controlled based on last clinic BP of 118/74 mmHg (01/16/23), goal <130/80 mmHg. Does not monitor BP at home. Denies lightheadedness, dizziness,  SOB, CP, vision changes.  Current Regimen: amlodipine 10 mg daily, hydrochlorothiazide 25 mg daily, metoprolol succinate 100 mg BID Continue medications without changes.  Future consideration: RAAS ideal, though avoid in the setting of hx angioedema on lisinopril   3. ASCVD (primary prevention): Controlled - On last lipid panel (08/28/22) LDL was 54 mg/dL, at goal of <16 mg/dL.  Key risk factors include: diabetes, hypertension, hyperlipidemia, former smoker, and BMI >30 kg/m2 Current Regimen: atorvastatin 20 mg daily Continue medications today without changes.    4. Healthcare Maintenance:  Pneumococcal - Current status: Up to date (last dose PPSV23 was 2018 after age 76)  Shingles - Current status: No record - DUE Influenza - Current status: DUE  Due to receive the following vaccines: Influenza and Shingrix   Follow Up Follow up with clinical pharmacist via phone ~4 weeks after next PCP appointment.  ?    Future Appointments  Date Time Provider Department Center  05/29/2023 10:30 AM [facility] PHARMACIST [facility]  07/02/2023  4:00 PM Nahser, Deloris Ping, MD [facility]  07/09/2023  1:40 PM [facility] ANNUAL WELLNESS VISIT 1 [facility]  09/03/2023  8:20 AM Doreene Nest, NP [facility]    Loree Fee, PharmD Clinical Pharmacist [facility] [phone]

## 2023-06-20 ENCOUNTER — Other Ambulatory Visit: Payer: Self-pay | Admitting: Primary Care

## 2023-06-20 DIAGNOSIS — E1140 Type 2 diabetes mellitus with diabetic neuropathy, unspecified: Secondary | ICD-10-CM

## 2023-06-27 ENCOUNTER — Telehealth: Payer: Self-pay

## 2023-06-27 NOTE — Telephone Encounter (Signed)
 Received 4 boxes of Ozempic through PAP Left message advising patient of this,  Placed in middle fridge.

## 2023-06-28 NOTE — Telephone Encounter (Signed)
 Patient came by and picked up medication.

## 2023-07-01 ENCOUNTER — Encounter: Payer: Self-pay | Admitting: Cardiovascular Disease

## 2023-07-01 NOTE — Progress Notes (Unsigned)
 OFFICE NOTE  Chief Complaint:   Occasional edema  Primary Care Physician: Doreene Nest, NP  Problem list 1. Essential hypertension 2. Atrial fibrillation 3. Diabetes Mellitus    HPI:  Carly Buckley  Is a pleasant 78 year old female kindly referred to me by Dr. Beverely Low for palpitations. She recently saw her in the office for a routine physical and was found to have atrial fibrillation. She does have a history of palpitations has gone on for number of years     July 22, 2015:  I'm seeing Carly Buckley again today after a several year absence.   She has been seen by Dr. Rennis Golden in the recent past.   I have seen her and her husband in the past.  No CP or dyspnea.   Does not check her BP at home .  Can tell when she goes into atrial fib - lasts a few minute /  Has been walking 2 miles a day  - has had some recent knee issues and she has slacked off a bit   03/06/2016:  No pains or problems   Aug 24, 2016: Doing well. Is not exercising as much as she would like .  Aug. 31, 2018: Carly Buckley is seen today for follow up of her paroxysmal atrial fib . Walks with a cane now.  Has neuropathy - helps with balance  Is maintaining NSR   July 18, 2017  Doing well.  Very few palpitations.   BP and HR are normal  Exercises some   September 22, 2019: Carly Buckley is seen today for follow up of her PAF.   Was last seen 2 years ago  Has occasional plapitations that only last a few seconds.  Never more than a minute  Not associated with dizziness or dyspnea.  Walks almost daily . Monday  - Friday .  1 - 2 miles a day   BP is elevated.   Still eating salty foods.   January 17, 2021:  Carly Buckley is seen back for follow up of her PAF and HTN.   Still eating salty foods .  No Cp or dyspnea  Had tongue swelling with Lisinopril Will avoid ACE-I and ARBs.  September 25, 2021: Carly Buckley seen today for follow-up visit.  She has a history of paroxysmal atrial relation and hypertension. Still eating salt Is not  exercising much    July 06, 2022  Carly Buckley is seen today for follow up . Hx of PAF, HTN  Her BP at home have been normal .  Eats hot dogs and bologna   Walks at the [employer] building  Usually a mile a day    July 02, 2023 Carly Buckley is seen for follow up of her PAF, HTN  No CP or dyspnea  Is active  Is exercising some   Tries to avoid salt Is avoiding bologna and hot dogs        PMHx:  Past Medical History:  Diagnosis Date   Angioedema    Arthritis    Colon cancer (HCC)    colon ca dx 07, colon resection   Complication of anesthesia    Diabetes mellitus    Diverticulitis    Dysrhythmia    palpitations   Helicobacter pylori (H. pylori)    Hypertension    Malignant neoplasm of colon    Neuropathy, idiopathic    PONV (postoperative nausea and vomiting)    Postoperative anemia due to acute blood loss 08/05/2012   Tachycardia 08/19/2012  Type II diabetes mellitus without mention of complication     Past Surgical History:  Procedure Laterality Date   COLON RESECTION  2007   colon cancer   STERIOD INJECTION Left 08/04/2012   Procedure: STEROID INJECTION;  Surgeon: Loanne Drilling, MD;  Location: [facility];  Service: Orthopedics;  Laterality: Left;   TONSILLECTOMY     TOTAL KNEE ARTHROPLASTY Right 08/04/2012   Procedure: TOTAL KNEE ARTHROPLASTY;  Surgeon: Loanne Drilling, MD;  Location: [facility];  Service: Orthopedics;  Laterality: Right;   TUBAL LIGATION      FAMHx:  Family History  Problem Relation Age of Onset   Lung cancer Mother 64   Kidney failure Father 47   Diabetes Sister    Diabetes Brother    Brain cancer Daughter     SOCHx:   reports that she quit smoking about 51 years ago. Her smoking use included cigarettes. She has never used smokeless tobacco. She reports that she does not drink alcohol and does not use drugs.  ALLERGIES:  Allergies  Allergen Reactions   Lisinopril Swelling   Morphine And Codeine     Nauseated    Oxycodone Nausea  Only    ROS: Noted in current hx. Otherwise negative   HOME MEDS: Current Outpatient Medications  Medication Sig Dispense Refill   amLODipine (NORVASC) 10 MG tablet TAKE 1 TABLET BY MOUTH DAILY FOR BLOOD PRESSURE 90 tablet 2   apixaban (ELIQUIS) 5 MG TABS tablet Take 1 tablet (5 mg total) by mouth 2 (two) times daily. 60 tablet 5   atorvastatin (LIPITOR) 20 MG tablet TAKE 1 TABLET BY MOUTH DAILY FOR CHOLESTEROL 90 tablet 2   blood glucose meter kit and supplies KIT Dispense based on patient and insurance preference. Use up to four times daily as directed. (FOR ICD-9 250.00, 250.01). 1 each 0   Calcium Carb-Cholecalciferol (CALCIUM+D3 PO) Take 1 tablet by mouth daily.     glipiZIDE (GLUCOTROL) 10 MG tablet TAKE 1 TABLET BY MOUTH TWICE DAILY FOR DIABETES 180 tablet 0   hydrochlorothiazide (HYDRODIURIL) 25 MG tablet TAKE 1 TABLET BY MOUTH DAILY FOR HIGH BLOOD PRESSURE 90 tablet 2   metFORMIN (GLUCOPHAGE-XR) 500 MG 24 hr tablet Take 2 tablets by mouth every morning and 1 tablet by mouth every evening for diabetes. 270 tablet 1   metoprolol succinate (TOPROL-XL) 100 MG 24 hr tablet TAKE ONE TABLET BY MOUTH TWICE DAILY WITH OR IMMEDIATELY FOLLOWING A MEAL 180 tablet 3   Multiple Vitamin (MULTIVITAMIN WITH MINERALS) TABS tablet Take 1 tablet by mouth daily.     ONETOUCH VERIO test strip USE FOUR TIMES DAILY AS DIRECTED 300 strip 1   potassium chloride (KLOR-CON) 10 MEQ tablet TAKE 1 TABLET BY MOUTH DAILY 90 tablet 1   Propylene Glycol 0.6 % SOLN Place 1 drop into both eyes daily as needed (for dry eyes).     Semaglutide, 1 MG/DOSE, 4 MG/3ML SOPN Inject 1 mg as directed once a week. for diabetes. 9 mL 0   No current facility-administered medications for this visit.    LABS/IMAGING: No results found for this or any previous visit (from the past 48 hours). No results found.  VITALS: BP (!) 142/60   Pulse 85   Ht 5\' 5"  (1.651 m)   Wt 186 lb 6.4 oz (84.6 kg)   SpO2 98%   BMI 31.02 kg/m      Physical Exam: Blood pressure (!) 142/60, pulse 85, height 5\' 5"  (1.651 m), weight 186 lb  6.4 oz (84.6 kg), SpO2 98%.  HYPERTENSION CONTROL Vitals:   07/02/23 1617 07/02/23 1634  BP: (!) 158/74 (!) 142/60    The patient's blood pressure is elevated above target today.  In order to address the patient's elevated BP: Blood pressure will be monitored at home to determine if medication changes need to be made.       GEN:  Well nourished, well developed in no acute distress HEENT: Normal NECK: No JVD; No carotid bruits LYMPHATICS: No lymphadenopathy CARDIAC: RRR , no murmurs, rubs, gallops RESPIRATORY:  Clear to auscultation without rales, wheezing or rhonchi  ABDOMEN: Soft, non-tender, non-distended MUSCULOSKELETAL:  No edema; No deformity  SKIN: Warm and dry NEUROLOGIC:  Alert and oriented x 3   EKG:       EKG Interpretation Date/Time:  Tuesday July 02 2023 16:19:51 EDT Ventricular Rate:  85 PR Interval:  266 QRS Duration:  82 QT Interval:  590 QTC Calculation: 702 R Axis:   -16  Text Interpretation: Sinus rhythm with 1st degree A-V block her QT is normal the computer has overestimated the QT interval Confirmed by Kristeen Miss ([id number]) on 07/02/2023 4:29:40 PM    Assessment/plan 1. Paroxysmal atrial fibrillation.     Her CHADS2VASC score is 4 -   Remains in normal sinus rhythm now.  Continue Eliquis.      2. Hypertension:    BP is mildly elevated.  Will add losartan 50 mg a day.  Will check basic metabolic profile in 2 to 3 weeks.  I have encouraged her to work on diet, exercise, weight loss.  She will return to see [place] in 6 months.    3. Type 2 diabetes mellitus with hypertension:      Kristeen Miss, MD  07/02/2023 4:35 PM    [facility] [address] [place],  [address] [place], [place]  [address] Pager [phone] Phone: [phone]; Fax: [phone]

## 2023-07-02 ENCOUNTER — Ambulatory Visit: Payer: Medicare Other | Attending: Cardiovascular Disease | Admitting: Cardiovascular Disease

## 2023-07-02 ENCOUNTER — Encounter: Payer: Self-pay | Admitting: Cardiovascular Disease

## 2023-07-02 VITALS — BP 142/60 | HR 85 | Ht 65.0 in | Wt 186.4 lb

## 2023-07-02 DIAGNOSIS — I10 Essential (primary) hypertension: Secondary | ICD-10-CM | POA: Diagnosis not present

## 2023-07-02 MED ORDER — LOSARTAN POTASSIUM 50 MG PO TABS: 50.0000 mg | ORAL_TABLET | Freq: Every day | ORAL | 3 refills | Status: AC

## 2023-07-02 NOTE — Patient Instructions (Signed)
 Medication Instructions:  Your physician has recommended you make the following change in your medication:   1) START losartan 50 mg daily  *If you need a refill on your cardiac medications before your next appointment, please call your pharmacy*  Lab Work: In 2-3 weeks at [employer]: [employer] may go to any of these [facility] locations:   [facility] - [address] ([facility]) - [address] [phone] [address]   [place] - [address]  - [address]    If you have labs (blood work) drawn today and your tests are completely normal, you will receive your results only by: [employer] (if you have MyChart) OR A paper copy in the mail If you have any lab test that is abnormal or we need to change your treatment, we will call you to review the results.  Follow-Up: At [facility], you and your health needs are our priority.  As part of our continuing mission to provide you with exceptional heart care, we have created designated Provider Care Teams.  These Care Teams include your primary Cardiologist (physician) and Advanced Practice Providers (APPs -  Physician Assistants and Nurse Practitioners) who all work together to provide you with the care you need, when you need it.  Your next appointment:   6 month(s)  The format for your next appointment:   In Person  Provider:   Jari Favre, PA-C, Ronie Spies, PA-C, Robin Searing, NP, Jacolyn Reedy, PA-C, Tereso Newcomer, PA-C, or Perlie Gold, PA-C  Other Instructions   1st Floor: - Lobby - Registration  - Pharmacy  - Lab - Cafe  2nd Floor: - PV Lab - Diagnostic Testing (echo, CT, nuclear med)  3rd Floor: - Vacant  4th Floor: - TCTS (cardiothoracic surgery) - AFib Clinic - Structural Heart Clinic - Vascular Surgery  - Vascular Ultrasound  5th Floor: - HeartCare Cardiology (general and EP) - Clinical Pharmacy  for coumadin, hypertension, lipid, weight-loss medications, and med management appointments    Valet parking services will be available as well.

## 2023-07-15 ENCOUNTER — Other Ambulatory Visit: Payer: Self-pay | Admitting: Primary Care

## 2023-07-15 DIAGNOSIS — E785 Hyperlipidemia, unspecified: Secondary | ICD-10-CM

## 2023-07-15 DIAGNOSIS — I10 Essential (primary) hypertension: Secondary | ICD-10-CM

## 2023-07-19 ENCOUNTER — Other Ambulatory Visit: Payer: Self-pay | Admitting: Cardiovascular Disease

## 2023-07-30 ENCOUNTER — Other Ambulatory Visit: Payer: Self-pay | Admitting: Primary Care

## 2023-07-30 DIAGNOSIS — I10 Essential (primary) hypertension: Secondary | ICD-10-CM

## 2023-09-03 ENCOUNTER — Encounter: Payer: Medicare Other | Admitting: Primary Care

## 2023-09-17 ENCOUNTER — Encounter: Admitting: Primary Care

## 2023-09-26 ENCOUNTER — Ambulatory Visit: Admitting: Primary Care

## 2023-09-26 ENCOUNTER — Encounter: Payer: Self-pay | Admitting: Primary Care

## 2023-09-26 VITALS — BP 148/82 | HR 91 | Temp 98.4°F | Ht 65.0 in | Wt 189.0 lb

## 2023-09-26 DIAGNOSIS — E7800 Pure hypercholesterolemia, unspecified: Secondary | ICD-10-CM

## 2023-09-26 DIAGNOSIS — Z1211 Encounter for screening for malignant neoplasm of colon: Secondary | ICD-10-CM

## 2023-09-26 DIAGNOSIS — Z7984 Long term (current) use of oral hypoglycemic drugs: Secondary | ICD-10-CM

## 2023-09-26 DIAGNOSIS — I480 Paroxysmal atrial fibrillation: Secondary | ICD-10-CM | POA: Diagnosis not present

## 2023-09-26 DIAGNOSIS — I10 Essential (primary) hypertension: Secondary | ICD-10-CM | POA: Diagnosis not present

## 2023-09-26 DIAGNOSIS — Z0000 Encounter for general adult medical examination without abnormal findings: Secondary | ICD-10-CM

## 2023-09-26 DIAGNOSIS — Z85038 Personal history of other malignant neoplasm of large intestine: Secondary | ICD-10-CM | POA: Diagnosis not present

## 2023-09-26 DIAGNOSIS — E1140 Type 2 diabetes mellitus with diabetic neuropathy, unspecified: Secondary | ICD-10-CM

## 2023-09-26 DIAGNOSIS — Z23 Encounter for immunization: Secondary | ICD-10-CM | POA: Diagnosis not present

## 2023-09-26 NOTE — Assessment & Plan Note (Addendum)
 Above goal today and about the same during cardiology visit in March 2025, despite losartan  50 mg.  I've asked she start watching BP at home, report if readings are consistently above 140/90.  Continue losartan  50 mg daily, hydrochlorothiazide  25 mg daily, metoprolol  succinate 100 mg daily, amlodipine  10 mg daily, CMP pending.

## 2023-09-26 NOTE — Assessment & Plan Note (Signed)
 Following with cardiology, office notes reviewed from March 2025. Rate and rhythm regular today.  Continue Eliquis  5 mg BID, metoprolol  succinate 100 mg daily.

## 2023-09-26 NOTE — Assessment & Plan Note (Signed)
 Colonoscopy due this year. Referral placed to [facility].

## 2023-09-26 NOTE — Assessment & Plan Note (Signed)
 Repeat lipid panel pending. Continue atorvastatin 20 mg daily.

## 2023-09-26 NOTE — Assessment & Plan Note (Signed)
 Prevnar 20 provided today. Mammogram and Bone density scan due in July 2025. She will schedule through [facility] Colonoscopy due, referral placed to GI given colon cancer history  Discussed the importance of a healthy diet and regular exercise in order for weight loss, and to reduce the risk of further co-morbidity.  Exam stable. Labs pending.  Follow up in 1 year for repeat physical.

## 2023-09-26 NOTE — Progress Notes (Signed)
 Subjective:    Patient ID: Carly Buckley, female    DOB: 1945-04-28, 78 y.o.   MRN: [id number]  HPI  Carly Buckley is a very pleasant 78 y.o. female who presents today for complete physical and follow up of chronic conditions.  Immunizations: -Tetanus: Completed in 2008  -Shingles: Never completed  -Pneumonia: Completed Prevnar 13 in 2016, Pneumovax 23 in 2018  Diet: Fair diet.  Exercise: No regular exercise.  Eye exam: Completes annually  Dental exam: Completed >1 year ago  Mammogram: Completed in July 2024 Bone Density Scan: Completed in May 2023  Colonoscopy: Completed in 2020, due 2025   BP Readings from Last 3 Encounters:  09/26/23 (!) 148/82  07/02/23 (!) 142/60  05/01/23 118/70      Review of Systems  Constitutional:  Negative for unexpected weight change.  HENT:  Negative for rhinorrhea.   Respiratory:  Negative for cough and shortness of breath.   Cardiovascular:  Negative for chest pain.  Gastrointestinal:  Negative for constipation and diarrhea.  Genitourinary:  Negative for difficulty urinating.  Musculoskeletal:  Negative for arthralgias and myalgias.  Skin:  Negative for rash.  Allergic/Immunologic: Negative for environmental allergies.  Neurological:  Negative for dizziness, numbness and headaches.  Psychiatric/Behavioral:  The patient is not nervous/anxious.          Past Medical History:  Diagnosis Date   Abnormal EKG 05/24/2014   Angioedema    Arthritis    Colon cancer (HCC)    colon ca dx 07, colon resection   Complication of anesthesia    Diabetes mellitus    Diverticulitis    DOE (dyspnea on exertion) 05/24/2014   Dysrhythmia    palpitations   Helicobacter pylori (H. pylori)    Hematuria 01/08/2022   Hypertension    Malignant neoplasm of colon    Neck pain 06/13/2018   Neuropathy, idiopathic    PONV (postoperative nausea and vomiting)    Postoperative anemia due to acute blood loss 08/05/2012   Rash and nonspecific skin  eruption 07/24/2016   Tachycardia 08/19/2012   Type II diabetes mellitus without mention of complication     Social History   Socioeconomic History   Marital status: Married    Spouse name: Not on file   Number of children: Not on file   Years of education: Not on file   Highest education level: Not on file  Occupational History   Not on file  Tobacco Use   Smoking status: Former    Current packs/day: 0.00    Types: Cigarettes    Quit date: 06/18/1972    Years since quitting: 51.3   Smokeless tobacco: Never  Vaping Use   Vaping status: Never Used  Substance and Sexual Activity   Alcohol  use: No   Drug use: No   Sexual activity: Not Currently  Other Topics Concern   Not on file  Social History Narrative   Married.   4 children, 7 grandchildren, 3 great grandchildren.   Retired. Once worked at [employer].   Enjoys walking, puzzle books, spending time with family.   Social Drivers of Corporate investment banker Strain: Low Risk  (07/05/2022)   Overall Financial Resource Strain (CARDIA)    Difficulty of Paying Living Expenses: Not hard at all  Food Insecurity: No Food Insecurity (07/05/2022)   Hunger Vital Sign    Worried About Running Out of Food in the Last Year: Never true    Ran Out of Food in  the Last Year: Never true  Transportation Needs: No Transportation Needs (07/05/2022)   PRAPARE - Administrator, Civil Service (Medical): No    Lack of Transportation (Non-Medical): No  Physical Activity: Sufficiently Active (07/05/2022)   Exercise Vital Sign    Days of Exercise per Week: 7 days    Minutes of Exercise per Session: 40 min  Stress: No Stress Concern Present (07/05/2022)   [employer] of Occupational Health - Occupational Stress Questionnaire    Feeling of Stress : Not at all  Social Connections: Moderately Integrated (07/05/2022)   Social Connection and Isolation Panel    Frequency of Communication with Friends and Family: More than  three times a week    Frequency of Social Gatherings with Friends and Family: More than three times a week    Attends Religious Services: More than 4 times per year    Active Member of [employer] or Organizations: No    Attends Banker Meetings: Never    Marital Status: Married  Catering manager Violence: Not At Risk (07/05/2022)   Humiliation, Afraid, Rape, and Kick questionnaire    Fear of Current or Ex-Partner: No    Emotionally Abused: No    Physically Abused: No    Sexually Abused: No    Past Surgical History:  Procedure Laterality Date   COLON RESECTION  2007   colon cancer   STERIOD INJECTION Left 08/04/2012   Procedure: STEROID INJECTION;  Surgeon: Aurther Blue, MD;  Location: [facility];  Service: Orthopedics;  Laterality: Left;   TONSILLECTOMY     TOTAL KNEE ARTHROPLASTY Right 08/04/2012   Procedure: TOTAL KNEE ARTHROPLASTY;  Surgeon: Aurther Blue, MD;  Location: [facility];  Service: Orthopedics;  Laterality: Right;   TUBAL LIGATION      Family History  Problem Relation Age of Onset   Lung cancer Mother 27   Kidney failure Father 29   Diabetes Sister    Diabetes Brother    Brain cancer Daughter     Allergies  Allergen Reactions   Lisinopril  Swelling   Morphine And Codeine     Nauseated    Oxycodone  Nausea Only    Current Outpatient Medications on File Prior to Visit  Medication Sig Dispense Refill   amLODipine  (NORVASC ) 10 MG tablet TAKE 1 TABLET BY MOUTH DAILY FOR BLOOD PRESSURE 90 tablet 0   apixaban  (ELIQUIS ) 5 MG TABS tablet Take 1 tablet (5 mg total) by mouth 2 (two) times daily. 60 tablet 5   atorvastatin  (LIPITOR) 20 MG tablet TAKE 1 TABLET BY MOUTH DAILY FOR CHOLESTEROL 90 tablet 0   blood glucose meter kit and supplies KIT Dispense based on patient and insurance preference. Use up to four times daily as directed. (FOR ICD-9 250.00, 250.01). 1 each 0   Calcium  Carb-Cholecalciferol (CALCIUM +D3 PO) Take 1 tablet by mouth daily.     glipiZIDE   (GLUCOTROL ) 10 MG tablet TAKE 1 TABLET BY MOUTH TWICE DAILY FOR DIABETES 180 tablet 0   hydrochlorothiazide  (HYDRODIURIL ) 25 MG tablet TAKE 1 TABLET BY MOUTH DAILY FOR HIGH BLOOD PRESSURE 90 tablet 0   losartan  (COZAAR ) 50 MG tablet Take 1 tablet (50 mg total) by mouth daily. 90 tablet 3   metFORMIN  (GLUCOPHAGE -XR) 500 MG 24 hr tablet Take 2 tablets by mouth every morning and 1 tablet by mouth every evening for diabetes. 270 tablet 1   metoprolol  succinate (TOPROL -XL) 100 MG 24 hr tablet TAKE ONE TABLET BY MOUTH TWICE DAILY WITH OR IMMEDIATELY  FOLLOWING A MEAL 180 tablet 3   Multiple Vitamin (MULTIVITAMIN WITH MINERALS) TABS tablet Take 1 tablet by mouth daily.     ONETOUCH VERIO test strip USE FOUR TIMES DAILY AS DIRECTED 300 strip 1   potassium chloride  (KLOR-CON ) 10 MEQ tablet TAKE 1 TABLET BY MOUTH DAILY 90 tablet 3   Propylene Glycol 0.6 % SOLN Place 1 drop into both eyes daily as needed (for dry eyes).     Semaglutide , 1 MG/DOSE, 4 MG/3ML SOPN Inject 1 mg as directed once a week. for diabetes. 9 mL 0   No current facility-administered medications on file prior to visit.    BP (!) 148/82   Pulse 91   Temp 98.4 F (36.9 C) (Temporal)   Ht 5' 5 (1.651 m)   Wt 189 lb (85.7 kg)   SpO2 98%   BMI 31.45 kg/m  Objective:   Physical Exam HENT:     Right Ear: Tympanic membrane and ear canal normal.     Left Ear: Tympanic membrane and ear canal normal.   Eyes:     Pupils: Pupils are equal, round, and reactive to light.    Cardiovascular:     Rate and Rhythm: Normal rate and regular rhythm.  Pulmonary:     Effort: Pulmonary effort is normal.     Breath sounds: Normal breath sounds.  Abdominal:     General: Bowel sounds are normal.     Palpations: Abdomen is soft.     Tenderness: There is no abdominal tenderness.   Musculoskeletal:        General: Normal range of motion.     Cervical back: Neck supple.   Skin:    General: Skin is warm and dry.   Neurological:     Mental  Status: She is alert and oriented to person, place, and time.     Cranial Nerves: No cranial nerve deficit.     Deep Tendon Reflexes:     Reflex Scores:      Patellar reflexes are 2+ on the right side and 2+ on the left side.  Psychiatric:        Mood and Affect: Mood normal.           Assessment & Plan:  Preventative health care Assessment & Plan: Prevnar 20 provided today. Mammogram and Bone density scan due in July 2025. She will schedule through [facility] Colonoscopy due, referral placed to GI given colon cancer history  Discussed the importance of a healthy diet and regular exercise in order for weight loss, and to reduce the risk of further co-morbidity.  Exam stable. Labs pending.  Follow up in 1 year for repeat physical.    Essential hypertension Assessment & Plan: Above goal today and about the same during cardiology visit in March 2025, despite losartan  50 mg.  I've asked she start watching BP at home, report if readings are consistently above 140/90.  Continue losartan  50 mg daily, hydrochlorothiazide  25 mg daily, metoprolol  succinate 100 mg daily, amlodipine  10 mg daily, CMP pending.   Orders: -     Comprehensive metabolic panel with GFR  Paroxysmal atrial fibrillation [facility]) Assessment & Plan: Following with cardiology, office notes reviewed from March 2025. Rate and rhythm regular today.  Continue Eliquis  5 mg BID, metoprolol  succinate 100 mg daily.    Type 2 diabetes mellitus with diabetic neuropathy, without long-term current use of insulin  (HCC) Assessment & Plan: Repeat A1c pending.  Continue metformin  ER 1000 mg in a.m., 500 mg in  p.m.; glipizide  10 mg twice daily; Ozempic  1 mg weekly Urine microalbumin due and pending  Follow-up in 3 to 6 months based on A1c result.  Orders: -     Microalbumin / creatinine urine ratio -     Hemoglobin A1c  History of colon cancer Assessment & Plan: Colonoscopy due this year. Referral placed to [facility].   Screening for colon cancer -     Ambulatory referral to Gastroenterology  Pure hypercholesterolemia Assessment & Plan: Repeat lipid panel pending. Continue atorvastatin  20 mg daily.  Orders: -     Lipid panel  Encounter for immunization -     Pneumococcal conjugate vaccine 20-valent        Gabriel John, NP

## 2023-09-26 NOTE — Patient Instructions (Signed)
 Stop by the lab prior to leaving today. I will notify you of your results once received.   You will receive a call from GI regarding the colonoscopy.  Start monitoring your blood pressure daily, around the same time of day, for the next 2-3 weeks.  Ensure that you have rested for 30 minutes prior to checking your blood pressure.   Record your readings and notify me if you see numbers consistently at or above 140 on top and/or 90 on bottom.  Please schedule a follow up visit for 6 months for a diabetes check.  It was a pleasure to see you today!

## 2023-09-26 NOTE — Assessment & Plan Note (Signed)
 Repeat A1c pending.  Continue metformin  ER 1000 mg in a.m., 500 mg in p.m.; glipizide  10 mg twice daily; Ozempic  1 mg weekly Urine microalbumin due and pending  Follow-up in 3 to 6 months based on A1c result.

## 2023-09-27 ENCOUNTER — Ambulatory Visit: Payer: Self-pay | Admitting: Primary Care

## 2023-09-28 LAB — COMPREHENSIVE METABOLIC PANEL WITH GFR
AG Ratio: 1.4 (calc) (ref 1.0–2.5)
ALT: 12 U/L (ref 6–29)
AST: 13 U/L (ref 10–35)
Albumin: 4.3 g/dL (ref 3.6–5.1)
Alkaline phosphatase (APISO): 77 U/L (ref 37–153)
BUN: 16 mg/dL (ref 7–25)
CO2: 23 mmol/L (ref 20–32)
Calcium: 9.8 mg/dL (ref 8.6–10.4)
Chloride: 99 mmol/L (ref 98–110)
Creat: 0.84 mg/dL (ref 0.60–1.00)
Globulin: 3.1 g/dL (ref 1.9–3.7)
Glucose, Bld: 115 mg/dL — ABNORMAL HIGH (ref 65–99)
Potassium: 3.8 mmol/L (ref 3.5–5.3)
Sodium: 137 mmol/L (ref 135–146)
Total Bilirubin: 0.5 mg/dL (ref 0.2–1.2)
Total Protein: 7.4 g/dL (ref 6.1–8.1)
eGFR: 72 mL/min/{1.73_m2} (ref >=60)

## 2023-09-28 LAB — LIPID PANEL
Cholesterol: 121 mg/dL (ref <200)
HDL: 58 mg/dL (ref >=50)
LDL Cholesterol (Calc): 49 mg/dL
Non-HDL Cholesterol (Calc): 63 mg/dL (ref <130)
Total CHOL/HDL Ratio: 2.1 (calc) (ref <5.0)
Triglycerides: 55 mg/dL (ref <150)

## 2023-09-28 LAB — MICROALBUMIN / CREATININE URINE RATIO
Creatinine, Urine: 35 mg/dL (ref 20–275)
Microalb Creat Ratio: 23 mg/g{creat} (ref <30)
Microalb, Ur: 0.8 mg/dL

## 2023-09-28 LAB — HEMOGLOBIN A1C
Hgb A1c MFr Bld: 8.4 % — ABNORMAL HIGH (ref <5.7)
Mean Plasma Glucose: 194 mg/dL
eAG (mmol/L): 10.8 mmol/L

## 2023-09-30 ENCOUNTER — Telehealth: Payer: Self-pay

## 2023-09-30 NOTE — Telephone Encounter (Signed)
 Patients assistance paperwork needs to be updated to reflect dose of Ozempic  2mg  weekly. Please advise once paperwork is ready for providers signature. Thank you.

## 2023-09-30 NOTE — Telephone Encounter (Signed)
 Completed [employer] change of dose form to increase Ozempic  and faxed to Tretha Fu for review.

## 2023-10-01 ENCOUNTER — Telehealth: Payer: Self-pay

## 2023-10-01 NOTE — Telephone Encounter (Signed)
 Received 4 boxes of Ozempic   Placed in middle fridge  Patient notified.

## 2023-10-01 NOTE — Telephone Encounter (Signed)
 Completed and placed in Kelli's inbox.

## 2023-10-01 NOTE — Telephone Encounter (Signed)
 Faxed ppw to [employer].

## 2023-10-03 NOTE — Telephone Encounter (Signed)
 Faxed completed change of dose form for Ozempic  to [employer] [facility]

## 2023-10-04 NOTE — Telephone Encounter (Signed)
 Patient picked up medication. Was notified 2mg  dose has been approved and will arrive in next 10-14 days.  Will be notified.

## 2023-10-14 ENCOUNTER — Other Ambulatory Visit: Payer: Self-pay | Admitting: Primary Care

## 2023-10-14 DIAGNOSIS — I10 Essential (primary) hypertension: Secondary | ICD-10-CM

## 2023-10-14 DIAGNOSIS — E785 Hyperlipidemia, unspecified: Secondary | ICD-10-CM

## 2023-10-29 ENCOUNTER — Telehealth: Payer: Self-pay

## 2023-10-29 NOTE — Telephone Encounter (Signed)
 Received patient assistance medications for patient.  Ozempic  4boxes  Medications have been placed in the refrigerator and labeled with patient information  and Left message to inform patient (ok per dpr)  medication at office for pick up during normal business hours.

## 2023-11-01 ENCOUNTER — Other Ambulatory Visit: Payer: Self-pay | Admitting: Nurse Practitioner

## 2023-11-01 ENCOUNTER — Other Ambulatory Visit: Payer: Self-pay | Admitting: Primary Care

## 2023-11-01 DIAGNOSIS — I10 Essential (primary) hypertension: Secondary | ICD-10-CM

## 2023-11-01 DIAGNOSIS — E1140 Type 2 diabetes mellitus with diabetic neuropathy, unspecified: Secondary | ICD-10-CM

## 2023-11-30 ENCOUNTER — Other Ambulatory Visit: Payer: Self-pay | Admitting: Primary Care

## 2023-11-30 DIAGNOSIS — E1140 Type 2 diabetes mellitus with diabetic neuropathy, unspecified: Secondary | ICD-10-CM

## 2023-12-04 ENCOUNTER — Telehealth: Payer: Self-pay

## 2023-12-04 ENCOUNTER — Other Ambulatory Visit (HOSPITAL_COMMUNITY): Payer: Self-pay

## 2023-12-04 DIAGNOSIS — E1140 Type 2 diabetes mellitus with diabetic neuropathy, unspecified: Secondary | ICD-10-CM

## 2023-12-04 NOTE — Telephone Encounter (Signed)
 Pharmacy Patient Advocate Encounter   Received notification from Onbase that prior authorization for One touch Verio strips is required/requested.   Insurance verification completed.   The patient is insured through [employer] .   Per test claim:  Contour Plus Blue or Accu-Chek Guide is preferred by the insurance.  If suggested medication is appropriate, Please send in a new RX and discontinue this one. If not, please advise as to why it's not appropriate so that we may request a Prior Authorization. Please note, some preferred medications may still require a PA.  If the suggested medications have not been trialed and there are no contraindications to their use, the PA will not be submitted, as it will not be approved.

## 2023-12-05 MED ORDER — CONTOUR PLUS TEST VI STRP: ORAL_STRIP | 3 refills | Status: AC

## 2023-12-05 NOTE — Telephone Encounter (Signed)
Noted, new Rx sent to pharmacy. 

## 2023-12-09 ENCOUNTER — Other Ambulatory Visit: Payer: Self-pay

## 2023-12-09 DIAGNOSIS — I480 Paroxysmal atrial fibrillation: Secondary | ICD-10-CM

## 2023-12-09 MED ORDER — APIXABAN 5 MG PO TABS: 5.0000 mg | ORAL_TABLET | Freq: Two times a day (BID) | ORAL | 5 refills | Status: AC

## 2023-12-09 NOTE — Telephone Encounter (Signed)
 Prescription refill request for Eliquis  received. Indication:afib Last office visit:3/25 Scr:0.84  6/25 Age: 79 Weight:85.7  kg  Prescription refilled

## 2023-12-17 ENCOUNTER — Telehealth: Payer: Self-pay

## 2023-12-17 NOTE — Telephone Encounter (Signed)
 Received reorder form from [employer] [facility] requesting a refill on Ozempic ,fill and faxed to provider office to sign and date,can be fax to [employer] or fax it back to [phone].

## 2023-12-27 ENCOUNTER — Other Ambulatory Visit: Payer: Self-pay | Admitting: Primary Care

## 2023-12-27 DIAGNOSIS — E1140 Type 2 diabetes mellitus with diabetic neuropathy, unspecified: Secondary | ICD-10-CM

## 2023-12-30 NOTE — Telephone Encounter (Signed)
 Received provider portion back from Dr office on refill reorder form [facility] [facility] Ozempic  and faxed to [employer] today.

## 2023-12-31 ENCOUNTER — Ambulatory Visit: Admitting: Primary Care

## 2023-12-31 DIAGNOSIS — E1140 Type 2 diabetes mellitus with diabetic neuropathy, unspecified: Secondary | ICD-10-CM

## 2024-01-03 ENCOUNTER — Telehealth: Payer: Self-pay

## 2024-01-03 NOTE — Telephone Encounter (Signed)
 Received patient assistance medications for patient.  Ozempic  4boxes  Medications have been placed in the refrigerator and labeled with patient information  and Left message to inform patient (ok per dpr)  medication at office for pick up during normal business hours.

## 2024-01-06 NOTE — Telephone Encounter (Signed)
 Patient picked up medication on 9.22.25

## 2024-01-13 ENCOUNTER — Telehealth: Payer: Self-pay

## 2024-01-13 NOTE — Telephone Encounter (Signed)
 Received a refill reorder form from [facility] [facility] (Ozempic ), fill and faxed to provider office to sign and date can be fax to [employer] or fax back to [phone]

## 2024-01-23 ENCOUNTER — Ambulatory Visit: Admitting: Primary Care

## 2024-01-27 ENCOUNTER — Other Ambulatory Visit: Payer: Self-pay

## 2024-01-27 DIAGNOSIS — I10 Essential (primary) hypertension: Secondary | ICD-10-CM

## 2024-01-28 ENCOUNTER — Telehealth: Payer: Self-pay | Admitting: Cardiovascular Disease

## 2024-01-28 DIAGNOSIS — I10 Essential (primary) hypertension: Secondary | ICD-10-CM

## 2024-01-28 MED ORDER — METOPROLOL SUCCINATE ER 100 MG PO TB24: ORAL_TABLET | ORAL | 1 refills | Status: AC

## 2024-01-28 NOTE — Telephone Encounter (Signed)
*  STAT* If patient is at the pharmacy, call can be transferred to refill team.   1. Which medications need to be refilled? (please list name of each medication and dose if known) metoprolol  succinate (TOPROL -XL) 100 MG 24 hr tablet    2. Would you like to learn more about the convenience, safety, & potential cost savings by using the [facility]? No   3. Are you open to using the [facility] (Type [facility] No   4. Which pharmacy/location (including street and city if local pharmacy) is medication to be sent to?  [facility] - [place], [place] - [address] [address]   5. Do they need a 30 day or 90 day supply? 90 day

## 2024-01-28 NOTE — Telephone Encounter (Signed)
Pt's medication was sent to pt's pharmacy as requested. Confirmation receive  

## 2024-02-03 ENCOUNTER — Encounter: Payer: Self-pay | Admitting: Primary Care

## 2024-02-03 ENCOUNTER — Ambulatory Visit: Payer: Self-pay | Admitting: Primary Care

## 2024-02-03 ENCOUNTER — Ambulatory Visit: Admitting: Primary Care

## 2024-02-03 VITALS — BP 128/66 | HR 81 | Temp 97.2°F | Ht 65.0 in | Wt 190.0 lb

## 2024-02-03 DIAGNOSIS — Z7985 Long-term (current) use of injectable non-insulin antidiabetic drugs: Secondary | ICD-10-CM | POA: Diagnosis not present

## 2024-02-03 DIAGNOSIS — Z23 Encounter for immunization: Secondary | ICD-10-CM | POA: Diagnosis not present

## 2024-02-03 DIAGNOSIS — E1140 Type 2 diabetes mellitus with diabetic neuropathy, unspecified: Secondary | ICD-10-CM | POA: Diagnosis not present

## 2024-02-03 DIAGNOSIS — Z7984 Long term (current) use of oral hypoglycemic drugs: Secondary | ICD-10-CM | POA: Diagnosis not present

## 2024-02-03 LAB — POCT GLYCOSYLATED HEMOGLOBIN (HGB A1C): Hemoglobin A1C: 6.7 % — AB (ref 4.0–5.6)

## 2024-02-03 NOTE — Progress Notes (Signed)
 Subjective:    Patient ID: Carly Buckley, female    DOB: 03-17-1946, 78 y.o.   MRN: [id number]  Carly Buckley is a very pleasant 78 y.o. female with a history of hypertension, type 2 diabetes, paroxysmal atrial fibrillation, hyperlipidemia who presents today for follow-up of diabetes.  1) Type 2 Diabetes:  Current medications include: Glipizide  10 mg twice daily, metformin  ER 1000 mg in a.m. and 500 mg in p.m., Ozempic  1 mg weekly.   She is tolerating Ozempic  well. She does have hiccups occurring multiple times daily.   She is checking her blood glucose 1 times daily and is getting readings of:   AM fasting: low 100s  Last A1C: 8.4 in June 2025, 6.7 today Last Eye Exam: Due Last Foot Exam: Due Pneumonia Vaccination: 2025 Urine Microalbumin: UTD Statin: atorvastatin    Dietary changes since last visit: She limits carbs in the form of pasta and potatoes. She has reduced intake of sweets.    Exercise: None  Wt Readings from Last 3 Encounters:  02/03/24 190 lb (86.2 kg)  09/26/23 189 lb (85.7 kg)  07/02/23 186 lb 6.4 oz (84.6 kg)     Review of Systems  Eyes:  Negative for visual disturbance.  Respiratory:  Negative for shortness of breath.   Cardiovascular:  Negative for chest pain.  Neurological:  Positive for numbness. Negative for dizziness.         Past Medical History:  Diagnosis Date   Abnormal EKG 05/24/2014   Angioedema    Arthritis    Colon cancer (HCC)    colon ca dx 07, colon resection   Complication of anesthesia    Diabetes mellitus    Diverticulitis    DOE (dyspnea on exertion) 05/24/2014   Dysrhythmia    palpitations   Helicobacter pylori (H. pylori)    Hematuria 01/08/2022   Hypertension    Malignant neoplasm of colon    Neck pain 06/13/2018   Neuropathy, idiopathic    PONV (postoperative nausea and vomiting)    Postoperative anemia due to acute blood loss 08/05/2012   Rash and nonspecific skin eruption 07/24/2016   Tachycardia  08/19/2012   Type II diabetes mellitus without mention of complication     Social History   Socioeconomic History   Marital status: Married    Spouse name: Not on file   Number of children: Not on file   Years of education: Not on file   Highest education level: Not on file  Occupational History   Not on file  Tobacco Use   Smoking status: Former    Current packs/day: 0.00    Types: Cigarettes    Quit date: 06/18/1972    Years since quitting: 51.6   Smokeless tobacco: Never  Vaping Use   Vaping status: Never Used  Substance and Sexual Activity   Alcohol  use: No   Drug use: No   Sexual activity: Not Currently  Other Topics Concern   Not on file  Social History Narrative   Married.   4 children, 7 grandchildren, 3 great grandchildren.   Retired. Once worked at [employer].   Enjoys walking, puzzle books, spending time with family.   Social Drivers of Corporate investment banker Strain: Low Risk  (07/05/2022)   Overall Financial Resource Strain (CARDIA)    Difficulty of Paying Living Expenses: Not hard at all  Food Insecurity: No Food Insecurity (07/05/2022)   Hunger Vital Sign    Worried About Running Out of  Food in the Last Year: Never true    Ran Out of Food in the Last Year: Never true  Transportation Needs: No Transportation Needs (07/05/2022)   PRAPARE - Administrator, Civil Service (Medical): No    Lack of Transportation (Non-Medical): No  Physical Activity: Sufficiently Active (07/05/2022)   Exercise Vital Sign    Days of Exercise per Week: 7 days    Minutes of Exercise per Session: 40 min  Stress: No Stress Concern Present (07/05/2022)   [employer] of Occupational Health - Occupational Stress Questionnaire    Feeling of Stress : Not at all  Social Connections: Moderately Integrated (07/05/2022)   Social Connection and Isolation Panel    Frequency of Communication with Friends and Family: More than three times a week    Frequency of  Social Gatherings with Friends and Family: More than three times a week    Attends Religious Services: More than 4 times per year    Active Member of [employer] or Organizations: No    Attends Banker Meetings: Never    Marital Status: Married  Catering manager Violence: Not At Risk (07/05/2022)   Humiliation, Afraid, Rape, and Kick questionnaire    Fear of Current or Ex-Partner: No    Emotionally Abused: No    Physically Abused: No    Sexually Abused: No    Past Surgical History:  Procedure Laterality Date   COLON RESECTION  2007   colon cancer   STERIOD INJECTION Left 08/04/2012   Procedure: STEROID INJECTION;  Surgeon: Dempsey LULLA Moan, MD;  Location: [facility];  Service: Orthopedics;  Laterality: Left;   TONSILLECTOMY     TOTAL KNEE ARTHROPLASTY Right 08/04/2012   Procedure: TOTAL KNEE ARTHROPLASTY;  Surgeon: Dempsey LULLA Moan, MD;  Location: [facility];  Service: Orthopedics;  Laterality: Right;   TUBAL LIGATION      Family History  Problem Relation Age of Onset   Lung cancer Mother 60   Kidney failure Father 74   Diabetes Sister    Diabetes Brother    Brain cancer Daughter     Allergies  Allergen Reactions   Lisinopril  Swelling   Morphine And Codeine     Nauseated    Oxycodone  Nausea Only    Current Outpatient Medications on File Prior to Visit  Medication Sig Dispense Refill   amLODipine  (NORVASC ) 10 MG tablet TAKE 1 TABLET BY MOUTH DAILY FOR BLOOD PRESSURE 90 tablet 2   apixaban  (ELIQUIS ) 5 MG TABS tablet Take 1 tablet (5 mg total) by mouth 2 (two) times daily. 60 tablet 5   atorvastatin  (LIPITOR) 20 MG tablet TAKE 1 TABLET BY MOUTH DAILY FOR CHOLESTEROL 90 tablet 2   blood glucose meter kit and supplies KIT Dispense based on patient and insurance preference. Use up to four times daily as directed. (FOR ICD-9 250.00, 250.01). 1 each 0   Calcium  Carb-Cholecalciferol (CALCIUM +D3 PO) Take 1 tablet by mouth daily.     glipiZIDE  (GLUCOTROL ) 10 MG tablet TAKE 1 TABLET  BY MOUTH TWICE DAILY FOR DIABETES 180 tablet 1   glucose blood (CONTOUR PLUS TEST) test strip Use to check blood sugar once daily for diabetes 100 each 3   hydrochlorothiazide  (HYDRODIURIL ) 25 MG tablet TAKE 1 TABLET BY MOUTH DAILY FOR HIGH BLOOD PRESSURE 90 tablet 2   losartan  (COZAAR ) 50 MG tablet Take 1 tablet (50 mg total) by mouth daily. 90 tablet 3   metFORMIN  (GLUCOPHAGE -XR) 500 MG 24 hr tablet TAKE 2 TABLETS  BY MOUTH EVERY MORNING AND 1 TABLET BY MOUTH EVERY EVENING FOR DIABETES 270 tablet 0   metoprolol  succinate (TOPROL -XL) 100 MG 24 hr tablet TAKE ONE TABLET BY MOUTH TWICE DAILY WITH OR IMMEDIATELY FOLLOWING A MEAL 180 tablet 1   Multiple Vitamin (MULTIVITAMIN WITH MINERALS) TABS tablet Take 1 tablet by mouth daily.     potassium chloride  (KLOR-CON ) 10 MEQ tablet TAKE 1 TABLET BY MOUTH DAILY 90 tablet 3   Propylene Glycol 0.6 % SOLN Place 1 drop into both eyes daily as needed (for dry eyes).     Semaglutide , 1 MG/DOSE, 4 MG/3ML SOPN Inject 1 mg as directed once a week. for diabetes. 9 mL 0   No current facility-administered medications on file prior to visit.    BP 128/66   Pulse 81   Temp (!) 97.2 F (36.2 C) (Temporal)   Ht 5' 5 (1.651 m)   Wt 190 lb (86.2 kg)   SpO2 99%   BMI 31.62 kg/m  Objective:   Physical Exam Cardiovascular:     Rate and Rhythm: Normal rate and regular rhythm.  Pulmonary:     Effort: Pulmonary effort is normal.     Breath sounds: Normal breath sounds.  Musculoskeletal:     Cervical back: Neck supple.  Skin:    General: Skin is warm and dry.  Neurological:     Mental Status: She is alert and oriented to person, place, and time.  Psychiatric:        Mood and Affect: Mood normal.     Physical Exam        Assessment & Plan:  Type 2 diabetes mellitus with diabetic neuropathy, without long-term current use of insulin  (HCC) Assessment & Plan: Improved and controlled with A1c of 6.7 today!!!  Continue metformin  ER 1000 mg in a.m. and 5  mg in p.m. Continue glipizide  10 mg twice daily. Continue Ozempic  1 mg weekly for now.  Foot exam today. Follow-up in 6 months  Orders: -     POCT glycosylated hemoglobin (Hb A1C)  Encounter for immunization -     Flu vaccine HIGH DOSE PF(Fluzone Trivalent)    Assessment and Plan Assessment & Plan         Comer MARLA Gaskins, NP    History of Present Illness

## 2024-02-03 NOTE — Assessment & Plan Note (Signed)
 Improved and controlled with A1c of 6.7 today!!!  Continue metformin  ER 1000 mg in a.m. and 5 mg in p.m. Continue glipizide  10 mg twice daily. Continue Ozempic  1 mg weekly for now.  Foot exam today. Follow-up in 6 months

## 2024-02-03 NOTE — Patient Instructions (Signed)
 For the hiccups you can try famotidine 20 mg once or twice daily.  If this is not effective then try omeprazole 20 mg daily.  Please schedule a follow up visit for 6 months for a diabetes check.  It was a pleasure to see you today!

## 2024-02-04 NOTE — Progress Notes (Deleted)
   Cardiology Office Note    Date:  02/04/2024  ID:  Kamil, Hanigan [birth year], MRN [id number] PCP:  Gretta Comer POUR, NP  Cardiologist:  Aleene Passe, MD (Inactive)  Electrophysiologist:  None   Chief Complaint: ***  History of Present Illness: .    Carly Buckley is a 78 y.o. female with visit-pertinent history of PAF, first degree AVB, HTN, HLD managed by PCP, DM, prior angioedema with ACEI, colon CA s/p remote resection, low risk/abnormal stress test 2016 seen for follow-up. Prior patient of Dr. Passe. NST 2016 was a low risk stress nuclear study with a small, severe intensity, partially reversible apical defect consistent with apical thinning and mild apical ischemia, EF 68%. She was found to be in atrial fibrillation back in 2016 during routine physical, maintained on anticoagulation. Appears she has largely maintained NSR over the preceding years, with hx of resting sinus tach as well.  No recent CBC   Paroxysmal atrial fibrillation First degree AVB Essential HTN Abnormal stress test    Labwork independently reviewed: 09/2023 K 3.8, Cr 0.84, A1c 8.4, LDL 49, trig 58, LFTs ok 2023 CBC OK  ROS: .    Please see the history of present illness. Otherwise, review of systems is positive for ***.  All other systems are reviewed and otherwise negative.  Studies Reviewed: SABRA    EKG:  EKG is ordered today, personally reviewed, demonstrating ***  CV Studies: Cardiac studies reviewed are outlined and summarized above. Otherwise please see EMR for full report.   Current Reported Medications:.    No outpatient medications have been marked as taking for the 02/05/24 encounter (Appointment) with Teddrick Mallari N, PA-C.    Physical Exam:    VS:  There were no vitals taken for this visit.   Wt Readings from Last 3 Encounters:  02/03/24 190 lb (86.2 kg)  09/26/23 189 lb (85.7 kg)  07/02/23 186 lb 6.4 oz (84.6 kg)    GEN: Well nourished, well developed in no acute  distress NECK: No JVD; No carotid bruits CARDIAC: ***RRR, no murmurs, rubs, gallops RESPIRATORY:  Clear to auscultation without rales, wheezing or rhonchi  ABDOMEN: Soft, non-tender, non-distended EXTREMITIES:  No edema; No acute deformity   Asessement and Plan:.     ***     Disposition: F/u with ***  Signed, Harpreet Pompey N Carly Abid, PA-C

## 2024-02-05 ENCOUNTER — Ambulatory Visit: Attending: Physician Assistant | Admitting: Physician Assistant

## 2024-02-05 DIAGNOSIS — I480 Paroxysmal atrial fibrillation: Secondary | ICD-10-CM

## 2024-02-05 DIAGNOSIS — I10 Essential (primary) hypertension: Secondary | ICD-10-CM

## 2024-02-05 DIAGNOSIS — R9439 Abnormal result of other cardiovascular function study: Secondary | ICD-10-CM

## 2024-02-05 DIAGNOSIS — I440 Atrioventricular block, first degree: Secondary | ICD-10-CM

## 2024-02-07 ENCOUNTER — Encounter: Payer: Self-pay | Admitting: Physician Assistant

## 2024-02-14 ENCOUNTER — Encounter: Payer: Self-pay | Admitting: Pharmacist

## 2024-02-14 NOTE — Progress Notes (Signed)
 Pharmacy Quality Measure Review  This patient is appearing on a report for being at risk of failing the adherence measure for cholesterol (statin) medications this calendar year.   Medication: atorvastatin  20 mg Last fill date: 10/14/23 for 90 day supply  Insurance report was not up to date. No action needed at this time.  Medication has been refilled as of 01/27/24 x90 ds.

## 2024-03-03 NOTE — Progress Notes (Signed)
  Cardiology Office Note:  .   Date:  03/16/2024  ID:  Carly Buckley, DOB 1945/06/26, MRN [id number] PCP: Gretta Comer POUR, NP  [facility] Providers Cardiologist:  Aleene Passe, MD (Inactive)    History of Present Illness: .   Carly Buckley is a 78 y.o. female with history of PAF, HTN, DM2.  Patient comes in for yearly f/u. She has an occasional flutter in her chest but not often. Denies chest pain, dyspnea, edema. Walks with a cane. Not exercising recently. Had been walking the track and needs to get back.  ROS:    Studies Reviewed: SABRA    EKG Interpretation Date/Time:  Monday March 16 2024 12:57:56 EST Ventricular Rate:  88 PR Interval:  254 QRS Duration:  88 QT Interval:  354 QTC Calculation: 428 R Axis:   -35  Text Interpretation: Sinus rhythm with 1st degree A-V block Left axis deviation Minimal voltage criteria for LVH, may be normal variant ( R in aVL ) Inferior infarct , age undetermined Anterolateral infarct (cited on or before 16-Mar-2024) When compared with ECG of 02-Jul-2023 16:19, no changes Confirmed by Parthenia Klinefelter [phone]) on 03/16/2024 1:03:56 PM    Prior CV Studies:   NST 2016 Overall Impression:  Low risk stress nuclear study with a small, severe intensity, partially reversible apical defect consistent with apical thinning and mild apical ischemia.   LV Wall Motion:  NL LV Function; NL Wall Motion     Redell Shallow, MD   06/04/2014 4:57 PM            Risk Assessment/Calculations:    CHA2DS2-VASc Score = 5   This indicates a 7.2% annual risk of stroke. The patient's score is based upon: CHF History: 0 HTN History: 1 Diabetes History: 1 Stroke History: 0 Vascular Disease History: 0 Age Score: 2 Gender Score: 1            Physical Exam:   VS:  BP 136/64 (BP Location: Left Arm, Patient Position: Sitting, Cuff Size: Normal)   Pulse 88   Ht 5' 5 (1.651 m)   Wt 190 lb (86.2 kg)   BMI 31.62 kg/m    Orhtostatics: No data  found. Wt Readings from Last 3 Encounters:  03/16/24 190 lb (86.2 kg)  02/03/24 190 lb (86.2 kg)  09/26/23 189 lb (85.7 kg)    GEN: Obese, in no acute distress NECK: No JVD; No carotid bruits CARDIAC:  RRR, no murmurs, rubs, gallops RESPIRATORY:  Clear to auscultation without rales, wheezing or rhonchi  ABDOMEN: Soft, non-tender, non-distended EXTREMITIES:  No edema; No deformity   ASSESSMENT AND PLAN: .    PAF on eliquis  and metoprolol . Only occasional palpitations. In NSR today. Check bmet & CBC today  HTN-BP controlled on amlodipine , losartan , toprol   DM2 A1C 6.7.   HLD on lipitor LDL 49 09/2023        Dispo: f/u 6 months  Signed, Klinefelter Parthenia, PA-C

## 2024-03-16 ENCOUNTER — Ambulatory Visit: Attending: Physician Assistant | Admitting: Physician Assistant

## 2024-03-16 ENCOUNTER — Encounter: Payer: Self-pay | Admitting: Physician Assistant

## 2024-03-16 VITALS — BP 136/64 | HR 88 | Ht 65.0 in | Wt 190.0 lb

## 2024-03-16 DIAGNOSIS — E1140 Type 2 diabetes mellitus with diabetic neuropathy, unspecified: Secondary | ICD-10-CM | POA: Diagnosis not present

## 2024-03-16 DIAGNOSIS — I10 Essential (primary) hypertension: Secondary | ICD-10-CM | POA: Diagnosis not present

## 2024-03-16 DIAGNOSIS — E7800 Pure hypercholesterolemia, unspecified: Secondary | ICD-10-CM | POA: Diagnosis not present

## 2024-03-16 DIAGNOSIS — I480 Paroxysmal atrial fibrillation: Secondary | ICD-10-CM | POA: Diagnosis not present

## 2024-03-16 NOTE — Patient Instructions (Signed)
 Medication Instructions:  NO CHANGES  Lab Work: BMET AND CBC TO BE DONE TODAY.  Testing/Procedures: NONE  Follow-Up: At [facility], you and your health needs are our priority.  As part of our continuing mission to provide you with exceptional heart care, our providers are all part of one team.  This team includes your primary Cardiologist (physician) and Advanced Practice Providers or APPs (Physician Assistants and Nurse Practitioners) who all work together to provide you with the care you need, when you need it.  Your next appointment:   6 MONTHS  Provider:   DR, KRISTE

## 2024-03-17 ENCOUNTER — Ambulatory Visit: Payer: Self-pay | Admitting: Physician Assistant

## 2024-03-17 LAB — CBC
Hematocrit: 35.8 % (ref 34.0–46.6)
Hemoglobin: 11.4 g/dL (ref 11.1–15.9)
MCH: 29 pg (ref 26.6–33.0)
MCHC: 31.8 g/dL (ref 31.5–35.7)
MCV: 91 fL (ref 79–97)
Platelets: 234 x10E3/uL (ref 150–450)
RBC: 3.93 x10E6/uL (ref 3.77–5.28)
RDW: 14.8 % (ref 11.7–15.4)
WBC: 6.9 x10E3/uL (ref 3.4–10.8)

## 2024-03-17 LAB — BASIC METABOLIC PANEL WITH GFR
BUN/Creatinine Ratio: 21 (ref 12–28)
BUN: 21 mg/dL (ref 8–27)
CO2: 21 mmol/L (ref 20–29)
Calcium: 10.4 mg/dL — AB (ref 8.7–10.3)
Chloride: 98 mmol/L (ref 96–106)
Creatinine, Ser: 1.02 mg/dL — AB (ref 0.57–1.00)
Glucose: 132 mg/dL — AB (ref 70–99)
Potassium: 4.3 mmol/L (ref 3.5–5.2)
Sodium: 141 mmol/L (ref 134–144)
eGFR: 56 mL/min/1.73 — AB (ref >59)

## 2024-03-27 ENCOUNTER — Ambulatory Visit: Admitting: Primary Care

## 2024-04-20 DIAGNOSIS — E1140 Type 2 diabetes mellitus with diabetic neuropathy, unspecified: Secondary | ICD-10-CM

## 2024-08-04 ENCOUNTER — Ambulatory Visit: Admitting: Primary Care
